# Patient Record
Sex: Female | Born: 1955
Health system: Southern US, Community
[De-identification: ages and names within clinical notes are randomized; demographics above are authoritative.]

## PROBLEM LIST (undated history)

## (undated) DIAGNOSIS — G43909 Migraine, unspecified, not intractable, without status migrainosus: Secondary | ICD-10-CM

## (undated) DIAGNOSIS — E785 Hyperlipidemia, unspecified: Secondary | ICD-10-CM

## (undated) DIAGNOSIS — I82401 Acute embolism and thrombosis of unspecified deep veins of right lower extremity: Secondary | ICD-10-CM

## (undated) DIAGNOSIS — R0602 Shortness of breath: Secondary | ICD-10-CM

## (undated) DIAGNOSIS — B029 Zoster without complications: Secondary | ICD-10-CM

## (undated) DIAGNOSIS — K219 Gastro-esophageal reflux disease without esophagitis: Secondary | ICD-10-CM

## (undated) DIAGNOSIS — T7840XA Allergy, unspecified, initial encounter: Secondary | ICD-10-CM

## (undated) DIAGNOSIS — L910 Hypertrophic scar: Secondary | ICD-10-CM

## (undated) DIAGNOSIS — M1990 Unspecified osteoarthritis, unspecified site: Secondary | ICD-10-CM

## (undated) DIAGNOSIS — I639 Cerebral infarction, unspecified: Secondary | ICD-10-CM

## (undated) DIAGNOSIS — Z780 Asymptomatic menopausal state: Secondary | ICD-10-CM

## (undated) DIAGNOSIS — L299 Pruritus, unspecified: Secondary | ICD-10-CM

## (undated) HISTORY — PX: OTHER SURGICAL HISTORY: SHX169

## (undated) HISTORY — PX: VARICOSE VEIN SURGERY: SHX832

## (undated) HISTORY — DX: Pruritus, unspecified: L29.9

## (undated) HISTORY — DX: Hyperlipidemia, unspecified: E78.5

## (undated) HISTORY — PX: ABDOMINAL HYSTERECTOMY: SHX81

## (undated) HISTORY — DX: Allergy, unspecified, initial encounter: T78.40XA

## (undated) HISTORY — DX: Hypertrophic scar: L91.0

## (undated) HISTORY — DX: Asymptomatic menopausal state: Z78.0

---

## 1979-04-01 DIAGNOSIS — I82401 Acute embolism and thrombosis of unspecified deep veins of right lower extremity: Secondary | ICD-10-CM

## 1979-04-01 HISTORY — DX: Acute embolism and thrombosis of unspecified deep veins of right lower extremity: I82.401

## 2011-04-30 ENCOUNTER — Emergency Department (HOSPITAL_BASED_OUTPATIENT_CLINIC_OR_DEPARTMENT_OTHER)
Admission: EM | Admit: 2011-04-30 | Discharge: 2011-04-30 | Disposition: A | Discharge status: Home / Self Care | Payer: Worker's Compensation | Attending: Emergency Medicine | Admitting: Emergency Medicine

## 2011-04-30 ENCOUNTER — Encounter: Payer: Self-pay | Admitting: *Deleted

## 2011-04-30 DIAGNOSIS — Z8739 Personal history of other diseases of the musculoskeletal system and connective tissue: Secondary | ICD-10-CM | POA: Insufficient documentation

## 2011-04-30 DIAGNOSIS — L089 Local infection of the skin and subcutaneous tissue, unspecified: Secondary | ICD-10-CM

## 2011-04-30 DIAGNOSIS — J45909 Unspecified asthma, uncomplicated: Secondary | ICD-10-CM | POA: Insufficient documentation

## 2011-04-30 HISTORY — DX: Unspecified osteoarthritis, unspecified site: M19.90

## 2011-04-30 MED ORDER — DOXYCYCLINE HYCLATE 100 MG PO CAPS: 100.0000 mg | ORAL_CAPSULE | Freq: Two times a day (BID) | ORAL | Status: AC

## 2011-04-30 MED ORDER — TETANUS-DIPHTH-ACELL PERTUSSIS 5-2.5-18.5 LF-MCG/0.5 IM SUSP
0.5000 mL | Freq: Once | INTRAMUSCULAR | Status: AC
  Administered 2011-04-30: 0.5 mL via INTRAMUSCULAR
  Filled 2011-04-30: qty 0.5

## 2011-04-30 MED ORDER — HYDROCODONE-ACETAMINOPHEN 5-325 MG PO TABS: 2.0000 | ORAL_TABLET | ORAL | Status: AC | PRN

## 2011-04-30 NOTE — ED Provider Notes (Signed)
Medical screening examination/treatment/procedure(s) were performed by non-physician practitioner and as supervising physician I was immediately available for consultation/collaboration.   Joya Gaskins, MD 04/30/11 [phone]

## 2011-04-30 NOTE — ED Provider Notes (Signed)
History     CSN: [id number] Arrival date & time: 04/30/2011  1:49 PM  Chief Complaint  Patient presents with  . Finger Injury    (Consider location/radiation/quality/duration/timing/severity/associated sxs/prior treatment) Patient is a 55 y.o. female presenting with hand pain. The history is provided by the patient. No language interpreter was used.  Hand Pain This is a new problem. The current episode started yesterday. The problem occurs constantly. The problem has been gradually worsening. The symptoms are aggravated by nothing. She has tried nothing for the symptoms. The treatment provided moderate relief.  Pt complains of swelling to her right middle finger.  Pt reports she stuck finger with a metal bread wrapper.  Pt complains of swelling and redness.  Past Medical History  Diagnosis Date  . Asthma   . Arthritis     Past Surgical History  Procedure Date  . Vein surgery   . Abdominal hysterectomy     No family history on file.  History  Substance Use Topics  . Smoking status: Never Smoker   . Smokeless tobacco: Not on file  . Alcohol Use: No    OB History    Grav Para Term Preterm Abortions TAB SAB Ect Mult Living                  Review of Systems  All other systems reviewed and are negative.    Allergies  Codeine  Home Medications   Current Outpatient Rx  Name Route Sig Dispense Refill  . ALBUTEROL IN Inhalation Inhale into the lungs.      . SYMBICORT IN Inhalation Inhale 1 puff into the lungs 2 (two) times daily.      Marland Kitchen HYDROXYZINE HCL PO Oral Take by mouth.      Marland Kitchen MONTELUKAST SODIUM 10 MG PO TABS Oral Take 10 mg by mouth at bedtime.      Marland Kitchen ROSUVASTATIN CALCIUM 10 MG PO TABS Oral Take 10 mg by mouth daily.        BP 133/73  Pulse 78  Temp(Src) 98 F (36.7 C) (Oral)  Resp 18  Ht 5\' 4"  (1.626 m)  Wt 190 lb (86.183 kg)  BMI 32.61 kg/m2  SpO2 99%  Physical Exam  Nursing note and vitals reviewed. Constitutional: She appears well-developed  and well-nourished.  HENT:  Head: Normocephalic.  Eyes: Pupils are equal, round, and reactive to light.  Musculoskeletal: She exhibits edema and tenderness.       Swollen distal finger right 3rd,  Warm to touch  Neurological: She is alert.  Skin: There is erythema.  Psychiatric: She has a normal mood and affect.    ED Course  Procedures (including critical care time)  Labs Reviewed - No data to display No results found.   No diagnosis found.    MDM  Pt counseled on soaking, antibiotics,  Pt advised to recheck here tomorrow.        Langston Masker, [place] 04/30/11 [phone]

## 2011-04-30 NOTE — ED Notes (Signed)
Pt reports she stuck her middle finger with twist tie at work on Friday- finger red and swollen- states this is worker's comp and she does need UDS

## 2011-05-01 ENCOUNTER — Emergency Department (INDEPENDENT_AMBULATORY_CARE_PROVIDER_SITE_OTHER): Payer: Worker's Compensation

## 2011-05-01 ENCOUNTER — Encounter (HOSPITAL_BASED_OUTPATIENT_CLINIC_OR_DEPARTMENT_OTHER): Payer: Self-pay | Admitting: *Deleted

## 2011-05-01 ENCOUNTER — Emergency Department (HOSPITAL_BASED_OUTPATIENT_CLINIC_OR_DEPARTMENT_OTHER)
Admission: EM | Admit: 2011-05-01 | Discharge: 2011-05-01 | Disposition: A | Discharge status: Home / Self Care | Payer: Worker's Compensation | Attending: Emergency Medicine | Admitting: Emergency Medicine

## 2011-05-01 DIAGNOSIS — W2209XA Striking against other stationary object, initial encounter: Secondary | ICD-10-CM | POA: Insufficient documentation

## 2011-05-01 DIAGNOSIS — X58XXXA Exposure to other specified factors, initial encounter: Secondary | ICD-10-CM

## 2011-05-01 DIAGNOSIS — Z79899 Other long term (current) drug therapy: Secondary | ICD-10-CM | POA: Insufficient documentation

## 2011-05-01 DIAGNOSIS — L089 Local infection of the skin and subcutaneous tissue, unspecified: Secondary | ICD-10-CM | POA: Insufficient documentation

## 2011-05-01 DIAGNOSIS — Y9269 Other specified industrial and construction area as the place of occurrence of the external cause: Secondary | ICD-10-CM | POA: Insufficient documentation

## 2011-05-01 DIAGNOSIS — S61209A Unspecified open wound of unspecified finger without damage to nail, initial encounter: Secondary | ICD-10-CM

## 2011-05-01 DIAGNOSIS — M79609 Pain in unspecified limb: Secondary | ICD-10-CM

## 2011-05-01 DIAGNOSIS — R609 Edema, unspecified: Secondary | ICD-10-CM

## 2011-05-01 DIAGNOSIS — J45909 Unspecified asthma, uncomplicated: Secondary | ICD-10-CM | POA: Insufficient documentation

## 2011-05-01 NOTE — ED Provider Notes (Signed)
History     CSN: [id number] Arrival date & time: 05/01/2011  1:08 PM  Chief Complaint  Patient presents with  . Finger Injury    (Consider location/radiation/quality/duration/timing/severity/associated sxs/prior treatment) HPI Patient injured her right middle finger yesterday while at work she punctured the distal tip with a metal twist tie used to close plastic bags of bread. Seen here yesterday prescribed hydrocodone and doxycycline presents today his pain is worse. She is uncertain if there could be a foreign body in the wound .no new injury pain is nonradiating located at distal phalanx of middle finger, right hand severity is moderate pain is worse with palpation somewhat improved with Norco prescribed Past Medical History  Diagnosis Date  . Asthma   . Arthritis     Past Surgical History  Procedure Date  . Vein surgery   . Abdominal hysterectomy     History reviewed. No pertinent family history.  History  Substance Use Topics  . Smoking status: Never Smoker   . Smokeless tobacco: Not on file  . Alcohol Use: No    OB History    Grav Para Term Preterm Abortions TAB SAB Ect Mult Living                  Review of Systems  Constitutional: Negative.   Musculoskeletal:       Trauma right middle finger  Skin: Negative.   Neurological: Negative.   Hematological: Negative.   Psychiatric/Behavioral: Negative.     Allergies  Codeine  Home Medications   Current Outpatient Rx  Name Route Sig Dispense Refill  . ALBUTEROL IN Inhalation Inhale into the lungs.      . SYMBICORT IN Inhalation Inhale 1 puff into the lungs 2 (two) times daily.      Marland Kitchen DOXYCYCLINE HYCLATE 100 MG PO CAPS Oral Take 1 capsule (100 mg total) by mouth 2 (two) times daily. 20 capsule 0  . HYDROCODONE-ACETAMINOPHEN 5-325 MG PO TABS Oral Take 2 tablets by mouth every 4 (four) hours as needed for pain. 10 tablet 0  . HYDROXYZINE HCL PO Oral Take by mouth.      Marland Kitchen MONTELUKAST SODIUM 10 MG PO TABS Oral  Take 10 mg by mouth at bedtime.      Marland Kitchen ROSUVASTATIN CALCIUM 10 MG PO TABS Oral Take 10 mg by mouth daily.        BP 129/72  Pulse 69  Temp(Src) 97.7 F (36.5 C) (Oral)  Resp 18  Ht 5\' 4"  (1.626 m)  Wt 190 lb (86.183 kg)  BMI 32.61 kg/m2  SpO2 100%  Physical Exam  Nursing note and vitals reviewed. Constitutional: She appears well-developed and well-nourished.  HENT:  Head: Normocephalic and atraumatic.  Eyes: Conjunctivae are normal. Pupils are equal, round, and reactive to light.  Neck: Neck supple. No tracheal deviation present. No thyromegaly present.  Cardiovascular: Normal rate and regular rhythm.   No murmur heard. Pulmonary/Chest: Effort normal and breath sounds normal.  Abdominal: Soft. Bowel sounds are normal. She exhibits no distension. There is no tenderness.  Musculoskeletal: Normal range of motion. She exhibits no edema and no tenderness.       Right middle finger tender distal phalanx with minimal redness the distal phalanx there is a 2 mm raised area at ulnar aspect of distal phalanx, which is minimally tender, good capillary refill  Neurological: She is alert. Coordination normal.  Skin: Skin is warm and dry. No rash noted.  Psychiatric: She has a normal mood and affect.  ED Course  Procedures (including critical care time)  Labs Reviewed - No data to display No results found.   No diagnosis found.    MDM  X-ray interpreted by radiologist normal, reviewed by me Spoke with Dr.Weingold. I suspect soft tissue infection. Plan soak finger in warm soapy water 3 times daily 20 minutes at a time. Continue present medications patient is to call Dr.Weingold's office to arrange to be seen tomorrow as outpatient Diagnosis soft tissue injury right middle finger        Doug Sou, MD 05/01/11 1405

## 2011-06-04 ENCOUNTER — Emergency Department (HOSPITAL_BASED_OUTPATIENT_CLINIC_OR_DEPARTMENT_OTHER)
Admission: EM | Admit: 2011-06-04 | Discharge: 2011-06-04 | Disposition: A | Discharge status: Home / Self Care | Payer: BC Managed Care – PPO | Attending: Emergency Medicine | Admitting: Emergency Medicine

## 2011-06-04 ENCOUNTER — Encounter (HOSPITAL_BASED_OUTPATIENT_CLINIC_OR_DEPARTMENT_OTHER): Payer: Self-pay | Admitting: Emergency Medicine

## 2011-06-04 DIAGNOSIS — B029 Zoster without complications: Secondary | ICD-10-CM | POA: Insufficient documentation

## 2011-06-04 DIAGNOSIS — J45909 Unspecified asthma, uncomplicated: Secondary | ICD-10-CM | POA: Insufficient documentation

## 2011-06-04 DIAGNOSIS — Z79899 Other long term (current) drug therapy: Secondary | ICD-10-CM | POA: Insufficient documentation

## 2011-06-04 DIAGNOSIS — Z8739 Personal history of other diseases of the musculoskeletal system and connective tissue: Secondary | ICD-10-CM | POA: Insufficient documentation

## 2011-06-04 HISTORY — DX: Zoster without complications: B02.9

## 2011-06-04 MED ORDER — VALACYCLOVIR HCL 1 G PO TABS: 1000.0000 mg | ORAL_TABLET | Freq: Three times a day (TID) | ORAL | Status: DC

## 2011-06-04 NOTE — ED Notes (Signed)
Triage acuity upgraded d/t pt c/o increased pain 7/10

## 2011-06-04 NOTE — ED Notes (Signed)
Pt has herpetic lesion on forehead.  Lesion is closed, no drainage.  Pt wants to treat it early.

## 2011-06-04 NOTE — ED Provider Notes (Signed)
History  Scribed for Carleene Cooper III, MD, the patient was seen in [facility]. The chart was scribed by Gilman Schmidt. The patients care was started at 5:26 PM.   CSN: [id number] Arrival date & time: 06/04/2011  3:26 PM   First MD Initiated Contact with Patient 06/04/11 1717      Chief Complaint  Patient presents with  . Herpes Zoster     HPI Anita Benton is a 55 y.o. female who presents to the Emergency Department complaining of herpes zoster. Pt has herpetic lesion on forehead. Pt has had lesions for ~20 years.  Lesion is closed, no drainage. Pt wants to treat it early. States the lesion itches and burns and "feels like skin is stretching". There are no other associated symptoms and no other alleviating or aggravating factors.     Past Medical History  Diagnosis Date  . Asthma   . Arthritis   . Shingles     Past Surgical History  Procedure Date  . Vein surgery   . Abdominal hysterectomy     History reviewed. No pertinent family history.  History  Substance Use Topics  . Smoking status: Never Smoker   . Smokeless tobacco: Not on file  . Alcohol Use: No    OB History    Grav Para Term Preterm Abortions TAB SAB Ect Mult Living                  Review of Systems  Skin:       Lesion   All other systems reviewed and are negative.    Allergies  Rice and Codeine  Home Medications   Current Outpatient Rx  Name Route Sig Dispense Refill  . SYMBICORT IN Inhalation Inhale 1 puff into the lungs 2 (two) times daily.      Marland Kitchen HYDROXYZINE HCL PO Oral Take 1 tablet by mouth once as needed. For itching    . MONTELUKAST SODIUM 10 MG PO TABS Oral Take 10 mg by mouth at bedtime.      Marland Kitchen ROSUVASTATIN CALCIUM 10 MG PO TABS Oral Take 10 mg by mouth daily.      . ALBUTEROL SULFATE HFA 108 (90 BASE) MCG/ACT IN AERS Inhalation Inhale 2 puffs into the lungs every 6 (six) hours as needed. For shortness of breath or wheezing     . VALACYCLOVIR HCL 1 G PO TABS Oral Take 1 tablet  (1,000 mg total) by mouth 3 (three) times daily. 21 tablet 0    BP 124/92  Pulse 95  Temp(Src) 98.3 F (36.8 C) (Oral)  Resp 22  Ht 5\' 4"  (1.626 m)  Wt 190 lb (86.183 kg)  BMI 32.61 kg/m2  SpO2 100%  Physical Exam  Constitutional: She is oriented to person, place, and time. She appears well-developed and well-nourished.  Non-toxic appearance. She does not have a sickly appearance.  HENT:  Head: Normocephalic and atraumatic.  Eyes: Conjunctivae, EOM and lids are normal. Pupils are equal, round, and reactive to light. No scleral icterus.  Neck: Trachea normal and normal range of motion.  Cardiovascular: Regular rhythm and normal heart sounds.   Pulmonary/Chest: Effort normal and breath sounds normal.  Abdominal: Soft. Normal appearance. There is no tenderness. There is no rebound, no guarding and no CVA tenderness.  Musculoskeletal: Normal range of motion.  Neurological: She is alert and oriented to person, place, and time. She has normal strength.  Skin: Skin is warm, dry and intact. Rash (versicular rash right side of forehead) noted.  ED Course  Procedures  DIAGNOSTIC STUDIES: Oxygen Saturation is 100% on room air, normal by my interpretation.    COORDINATION OF CARE: 5:26PM:  - Patient evaluated by ED physician,   Rx for herpes zoster with valcyclovir.  \I personally performed the services described in this documentation, which was scribed in my presence. The recorded information has been reviewed and considered.  Osvaldo Human, M.D.    Carleene Cooper III, MD 06/04/11 [phone]

## 2011-06-13 ENCOUNTER — Other Ambulatory Visit: Payer: Self-pay | Admitting: Internal Medicine

## 2011-06-13 ENCOUNTER — Ambulatory Visit (INDEPENDENT_AMBULATORY_CARE_PROVIDER_SITE_OTHER): Payer: BC Managed Care – PPO | Admitting: Internal Medicine

## 2011-06-13 ENCOUNTER — Encounter: Payer: Self-pay | Admitting: Internal Medicine

## 2011-06-13 DIAGNOSIS — Z1231 Encounter for screening mammogram for malignant neoplasm of breast: Secondary | ICD-10-CM

## 2011-06-13 DIAGNOSIS — Z23 Encounter for immunization: Secondary | ICD-10-CM

## 2011-06-13 DIAGNOSIS — J45909 Unspecified asthma, uncomplicated: Secondary | ICD-10-CM | POA: Insufficient documentation

## 2011-06-13 DIAGNOSIS — B029 Zoster without complications: Secondary | ICD-10-CM | POA: Insufficient documentation

## 2011-06-13 DIAGNOSIS — J309 Allergic rhinitis, unspecified: Secondary | ICD-10-CM

## 2011-06-13 DIAGNOSIS — I82409 Acute embolism and thrombosis of unspecified deep veins of unspecified lower extremity: Secondary | ICD-10-CM | POA: Insufficient documentation

## 2011-06-13 DIAGNOSIS — L910 Hypertrophic scar: Secondary | ICD-10-CM | POA: Insufficient documentation

## 2011-06-13 DIAGNOSIS — N951 Menopausal and female climacteric states: Secondary | ICD-10-CM

## 2011-06-13 DIAGNOSIS — Z780 Asymptomatic menopausal state: Secondary | ICD-10-CM | POA: Insufficient documentation

## 2011-06-13 DIAGNOSIS — T7840XA Allergy, unspecified, initial encounter: Secondary | ICD-10-CM | POA: Insufficient documentation

## 2011-06-13 DIAGNOSIS — Z139 Encounter for screening, unspecified: Secondary | ICD-10-CM

## 2011-06-13 DIAGNOSIS — E785 Hyperlipidemia, unspecified: Secondary | ICD-10-CM | POA: Insufficient documentation

## 2011-06-13 DIAGNOSIS — M1990 Unspecified osteoarthritis, unspecified site: Secondary | ICD-10-CM | POA: Insufficient documentation

## 2011-06-13 DIAGNOSIS — E559 Vitamin D deficiency, unspecified: Secondary | ICD-10-CM | POA: Insufficient documentation

## 2011-06-13 MED ORDER — BUDESONIDE-FORMOTEROL FUMARATE 160-4.5 MCG/ACT IN AERO: 2.0000 | INHALATION_SPRAY | Freq: Two times a day (BID) | RESPIRATORY_TRACT | Status: DC

## 2011-06-13 MED ORDER — ALBUTEROL SULFATE HFA 108 (90 BASE) MCG/ACT IN AERS: 2.0000 | INHALATION_SPRAY | Freq: Four times a day (QID) | RESPIRATORY_TRACT | Status: DC | PRN

## 2011-06-13 MED ORDER — ATORVASTATIN CALCIUM 10 MG PO TABS: 10.0000 mg | ORAL_TABLET | Freq: Every day | ORAL | Status: DC

## 2011-06-13 MED ORDER — ROSUVASTATIN CALCIUM 10 MG PO TABS: 10.0000 mg | ORAL_TABLET | Freq: Every day | ORAL | Status: DC

## 2011-06-13 MED ORDER — MONTELUKAST SODIUM 10 MG PO TABS: 10.0000 mg | ORAL_TABLET | Freq: Every day | ORAL | Status: DC

## 2011-06-13 MED ORDER — BUDESONIDE 32 MCG/ACT NA SUSP: 1.0000 | Freq: Every day | NASAL | Status: DC

## 2011-06-13 MED ORDER — ESCITALOPRAM OXALATE 10 MG PO TABS: 10.0000 mg | ORAL_TABLET | Freq: Every day | ORAL | Status: DC

## 2011-06-13 NOTE — Progress Notes (Signed)
Subjective:    Patient ID: Anita Benton, female    DOB: Jan 12, 1956, 55 y.o.   MRN: [id number]  HPI New pt here for first visit.  [facility] of [facility].  PMH of DJD hands and R hip, Vitamin D deficiency, hyperlipidemia, venous varicosities S/P vein stripping, asthma x 20 years, allergic rhinitis, chronic itching treated with Hydroxyzine, Herpes Zoster facial, and keloid scarring.  She recently has a new job within last year and now has Program[employer], applications.    Reports she had recent bout with facial shingles involving forehead and it has healed now.  It was treated with Valtrex.  She reports no visual or hearing problems.  She has had multiple episodes in the past.  She reports her insurance does not cover the vaccine until age 11.    Also has significant menopausal hot flushes.  Day and night sweating.  Started 3 years ago but worse now.  No vaginal dryness.  No personal of FH of MI or CVA.  Mother has HTN, no Personal of FH of ovarian or uterine CA.  Mggm and paternal cousin, and maternal niece has breast cancer.  Pt had post surgical DVT R leg  Allergies  Allergen Reactions  . Rice Shortness Of Breath and Rash  . Codeine Rash   Past Medical History  Diagnosis Date  . Asthma   . Arthritis   . Shingles   . Hyperlipidemia    Past Surgical History  Procedure Date  . Vein surgery   . Abdominal hysterectomy   . Cesarean section 1974, 1977, 1979   History   Social History  . Marital Status: Legally Separated    Spouse Name: N/A    Number of Children: N/A  . Years of Education: N/A   Occupational History  . Not on file.   Social History Main Topics  . Smoking status: Never Smoker   . Smokeless tobacco: Never Used  . Alcohol Use: No  . Drug Use: No  . Sexually Active: Yes    Birth Control/ Protection: Surgical   Other Topics Concern  . Not on file   Social History Narrative  . No narrative on file   Family History  Problem Relation Age of Onset  . Cancer  Mother     lung   There is no problem list on file for this patient.  Current Outpatient Prescriptions on File Prior to Visit  Medication Sig Dispense Refill  . albuterol (PROVENTIL HFA;VENTOLIN HFA) 108 (90 BASE) MCG/ACT inhaler Inhale 2 puffs into the lungs every 6 (six) hours as needed. For shortness of breath or wheezing       . Budesonide-Formoterol Fumarate (SYMBICORT IN) Inhale 2 puffs into the lungs daily.       . montelukast (SINGULAIR) 10 MG tablet Take 10 mg by mouth at bedtime.        . rosuvastatin (CRESTOR) 10 MG tablet Take 10 mg by mouth daily.             Review of Systems See HPI    Objective:   Physical Exam  Physical Exam  Nursing note and vitals reviewed.  Constitutional: She is oriented to person, place, and time. She appears well-developed and well-nourished.  HENT:  Head: Normocephalic and atraumatic.  Cardiovascular: Normal rate and regular rhythm. Exam reveals no gallop and no friction rub.  No murmur heard.  Pulmonary/Chest: Breath sounds normal. She has no wheezes. She has no rales.  Neurological: She is alert and oriented  to person, place, and time.  Skin: Skin is warm and dry.  Psychiatric: She has a normal mood and affect. Her behavior is normal.  LE:  Minimal edema       Assessment & Plan:  1)  Asthma  Flu vaccine today.  Reorder Symbicort, Proventil, Singulair 2) Menopausal hot flushes:  With h/o of DVT I counseled risks outweigh benefits.  Will try Lexapro 10 mg daily.  Pt aware it is off label use.  Pt received and I reviewed risk/benefit sheet with her. 3)  Allergic rhinitis:  Refill Rhinocort 4)  H/O DVT rR leg 5)  Hyperlipidemia  Check baseline labs, lipids 6)  Vitmain D deficiency  Check today 7)  Arthritis  Schedule CPE

## 2011-06-13 NOTE — Progress Notes (Signed)
Addended by: Nelly Laurence H on: 06/13/2011 12:05 PM   Modules accepted: Orders

## 2011-06-13 NOTE — Patient Instructions (Signed)
Labs willl be mailed to you  Take Lexapro once daily  Schedule CPE with me    Flu shot given today

## 2011-06-14 ENCOUNTER — Ambulatory Visit (HOSPITAL_BASED_OUTPATIENT_CLINIC_OR_DEPARTMENT_OTHER)
Admission: RE | Admit: 2011-06-14 | Discharge: 2011-06-14 | Disposition: A | Discharge status: Home / Self Care | Payer: BC Managed Care – PPO | Source: Ambulatory Visit | Attending: Internal Medicine | Admitting: Internal Medicine

## 2011-06-14 ENCOUNTER — Encounter: Payer: Self-pay | Admitting: Emergency Medicine

## 2011-06-14 DIAGNOSIS — Z1231 Encounter for screening mammogram for malignant neoplasm of breast: Secondary | ICD-10-CM | POA: Insufficient documentation

## 2011-06-14 LAB — CBC WITH DIFFERENTIAL/PLATELET
Basophils Absolute: 0 10*3/uL (ref 0.0–0.1)
Eosinophils Relative: 1 % (ref 0–5)
HCT: 41.2 % (ref 36.0–46.0)
Lymphocytes Relative: 23 % (ref 12–46)
MCHC: 33.7 g/dL (ref 30.0–36.0)
MCV: 89.8 fL (ref 78.0–100.0)
Monocytes Absolute: 0.5 10*3/uL (ref 0.1–1.0)
RDW: 14.2 % (ref 11.5–15.5)
WBC: 9.4 10*3/uL (ref 4.0–10.5)

## 2011-06-14 LAB — LIPID PANEL
HDL: 62 mg/dL (ref >39)
LDL Cholesterol: 134 mg/dL — ABNORMAL HIGH (ref 0–99)
Total CHOL/HDL Ratio: 3.5 Ratio

## 2011-06-14 LAB — COMPREHENSIVE METABOLIC PANEL
ALT: 9 U/L (ref 0–35)
Albumin: 3.9 g/dL (ref 3.5–5.2)
Alkaline Phosphatase: 76 U/L (ref 39–117)
BUN: 14 mg/dL (ref 6–23)
Chloride: 104 mEq/L (ref 96–112)
Creat: 0.7 mg/dL (ref 0.50–1.10)
Total Bilirubin: 0.5 mg/dL (ref 0.3–1.2)
Total Protein: 7.2 g/dL (ref 6.0–8.3)

## 2011-06-27 ENCOUNTER — Ambulatory Visit (INDEPENDENT_AMBULATORY_CARE_PROVIDER_SITE_OTHER): Payer: BC Managed Care – PPO | Admitting: Internal Medicine

## 2011-06-27 ENCOUNTER — Ambulatory Visit (HOSPITAL_BASED_OUTPATIENT_CLINIC_OR_DEPARTMENT_OTHER)
Admission: RE | Admit: 2011-06-27 | Discharge: 2011-06-27 | Disposition: A | Discharge status: Home / Self Care | Payer: BC Managed Care – PPO | Source: Ambulatory Visit | Attending: Internal Medicine | Admitting: Internal Medicine

## 2011-06-27 ENCOUNTER — Encounter: Payer: Self-pay | Admitting: Internal Medicine

## 2011-06-27 DIAGNOSIS — D229 Melanocytic nevi, unspecified: Secondary | ICD-10-CM | POA: Insufficient documentation

## 2011-06-27 DIAGNOSIS — D239 Other benign neoplasm of skin, unspecified: Secondary | ICD-10-CM

## 2011-06-27 DIAGNOSIS — B029 Zoster without complications: Secondary | ICD-10-CM

## 2011-06-27 DIAGNOSIS — R319 Hematuria, unspecified: Secondary | ICD-10-CM

## 2011-06-27 DIAGNOSIS — M169 Osteoarthritis of hip, unspecified: Secondary | ICD-10-CM | POA: Insufficient documentation

## 2011-06-27 DIAGNOSIS — M1610 Unilateral primary osteoarthritis, unspecified hip: Secondary | ICD-10-CM | POA: Insufficient documentation

## 2011-06-27 DIAGNOSIS — M25551 Pain in right hip: Secondary | ICD-10-CM

## 2011-06-27 DIAGNOSIS — Z0000 Encounter for general adult medical examination without abnormal findings: Secondary | ICD-10-CM

## 2011-06-27 DIAGNOSIS — Z139 Encounter for screening, unspecified: Secondary | ICD-10-CM

## 2011-06-27 DIAGNOSIS — M25559 Pain in unspecified hip: Secondary | ICD-10-CM

## 2011-06-27 DIAGNOSIS — I8390 Asymptomatic varicose veins of unspecified lower extremity: Secondary | ICD-10-CM

## 2011-06-27 DIAGNOSIS — Z23 Encounter for immunization: Secondary | ICD-10-CM

## 2011-06-27 DIAGNOSIS — N390 Urinary tract infection, site not specified: Secondary | ICD-10-CM | POA: Insufficient documentation

## 2011-06-27 LAB — POCT URINALYSIS DIPSTICK
Nitrite, UA: NEGATIVE
Urobilinogen, UA: NEGATIVE
pH, UA: 6.5

## 2011-06-27 MED ORDER — NITROFURANTOIN MACROCRYSTAL 100 MG PO CAPS: 100.0000 mg | ORAL_CAPSULE | Freq: Four times a day (QID) | ORAL | Status: AC

## 2011-06-27 NOTE — Progress Notes (Signed)
Addended by: Chip Boer on: 06/27/2011 10:49 AM   Modules accepted: Orders

## 2011-06-27 NOTE — Progress Notes (Signed)
Subjective:    Patient ID: Anita Benton, female    DOB: 01-18-1956, 55 y.o.   MRN: [id number]  HPI Lena is here for comprhensive evaluation.  She reports great relief from her menopausal flushes on Lexapro.   She is tolerating Lexapro well.  See U/A  She reports some urgency but no dysuria or flank pain.  She does report intermittant R hip pain that she attributes to arthriits.  Strong GH of arthritis.  No painnow.  Usually relieved with Alleve  Darkened nevus R hand has been present for years per her report.  No FH of skin ca She has never had a screening colonoscopy.   She reports infrequent blood in stool that she attributes to hemorrhoids.  No FH of GI malignancy  She had episode of herpes zoster in November of this year per her report  Allergies  Allergen Reactions  . Rice Shortness Of Breath and Rash  . Levaquin Itching and Swelling    Lip swelling  . Codeine Rash   Past Medical History  Diagnosis Date  . Shingles   . Arthritis   . Asthma   . Hyperlipidemia   . Vitamin D deficiency   . Allergy   . Itching   . Keloid   . Menopause    Past Surgical History  Procedure Date  . Vein surgery   . Abdominal hysterectomy   . Cesarean section 1974, 1977, 1979   History   Social History  . Marital Status: Divorced    Spouse Name: N/A    Number of Children: N/A  . Years of Education: N/A   Occupational History  . Not on file.   Social History Main Topics  . Smoking status: Never Smoker   . Smokeless tobacco: Never Used  . Alcohol Use: No  . Drug Use: No  . Sexually Active: Yes    Birth Control/ Protection: Surgical   Other Topics Concern  . Not on file   Social History Narrative  . No narrative on file   Family History  Problem Relation Age of Onset  . Cancer Mother     lung   Patient Active Problem List  Diagnoses  . Arthritis  . Asthma  . Hyperlipidemia  . Shingles  . Vitamin D deficiency  . Allergy  . Keloid  . Menopause  . DVT,  lower extremity  . Varicose veins   Current Outpatient Prescriptions on File Prior to Visit  Medication Sig Dispense Refill  . albuterol (PROVENTIL HFA;VENTOLIN HFA) 108 (90 BASE) MCG/ACT inhaler Inhale 2 puffs into the lungs every 6 (six) hours as needed. For shortness of breath or wheezing  1 Inhaler  1  . atorvastatin (LIPITOR) 10 MG tablet Take 1 tablet (10 mg total) by mouth at bedtime.  30 tablet  1  . budesonide (RHINOCORT AQUA) 32 MCG/ACT nasal spray Place 1 spray into the nose daily.  1 Bottle  1  . budesonide-formoterol (SYMBICORT) 160-4.5 MCG/ACT inhaler Inhale 2 puffs into the lungs 2 (two) times daily.  1 Inhaler  1  . escitalopram (LEXAPRO) 10 MG tablet Take 1 tablet (10 mg total) by mouth daily.  30 tablet  2  . hydrOXYzine (ATARAX/VISTARIL) 25 MG tablet Take 25 mg by mouth 2 (two) times daily as needed.        . montelukast (SINGULAIR) 10 MG tablet Take 1 tablet (10 mg total) by mouth at bedtime.  30 tablet  1       Review of Systems  See HPI  No chest pain no SOb, no edema NO cough No LE Edema No change in color of stool , dyspepsia,     Objective:   Physical Exam  Physical Exam  Nursing note and vitals reviewed.  Constitutional: She is oriented to person, place, and time. She appears well-developed and well-nourished.  HENT:  Head: Normocephalic and atraumatic.  Right Ear: Tympanic membrane and ear canal normal. No drainage. Tympanic membrane is not injected and not erythematous.  Left Ear: Tympanic membrane and ear canal normal. No drainage. Tympanic membrane is not injected and not erythematous.  Nose: Nose normal. Right sinus exhibits no maxillary sinus tenderness and no frontal sinus tenderness. Left sinus exhibits no maxillary sinus tenderness and no frontal sinus tenderness.  Mouth/Throat: Oropharynx is clear and moist. No oral lesions. No oropharyngeal exudate.  Eyes: Conjunctivae and EOM are normal. Pupils are equal, round, and reactive to light.  Neck:  Normal range of motion. Neck supple. No JVD present. Carotid bruit is not present. No mass and no thyromegaly present.  Cardiovascular: Normal rate, regular rhythm, S1 normal, S2 normal and intact distal pulses. Exam reveals no gallop and no friction rub.  No murmur heard.  Pulses:  Carotid pulses are 2+ on the right side, and 2+ on the left side.  Dorsalis pedis pulses are 2+ on the right side, and 2+ on the left side.  No carotid bruit. No LE edema  Pulmonary/Chest: Breath sounds normal. She has no wheezes. She has no rales. She exhibits no tenderness.  Abdominal: Soft. Bowel sounds are normal. She exhibits no distension and no mass. There is no hepatosplenomegaly. There is no tenderness. There is no CVA tenderness.  Musculoskeletal: Normal range of motion.  No active synovitis to joints.  L hip no pain with ROM.  R hip no pain with ROM  Lymphadenopathy:  She has no cervical adenopathy.  She has no axillary adenopathy.  Right: No inguinal and no supraclavicular adenopathy present.  Left: No inguinal and no supraclavicular adenopathy present.  Neurological: She is alert and oriented to person, place, and time. She has normal strength and normal reflexes. She displays no tremor. No cranial nerve deficit or sensory deficit. Coordination and gait normal.  Skin: Skin is warm and dry. No rash noted. No cyanosis. Nails show no clubbing.   R palm has  Darkened nevus Psychiatric: She has a normal mood and affect. Her speech is normal and behavior is normal. Cognition and memory are normal.         Assessment & Plan:  1)  Health Maintenance  See scanned sheet.  Pneumovax given today.  Will schedule Gi referral for screening colonsocopy.  UTD with mammogram  EKG  NSR no acute changes 2)  R hip pain X-ray today  OTC NSaid of choice for flares 3)  Darkened nevus R palm will refer to [employer] 4)  Hematuria Will treat for UTI  With Macrodantin for 1 week.  Return to see me in January for  repeat U/A 5)  ASthma  Well controlled no Rescue inhaler use. 6)  Hyperlipidemia on Lipitor 7)  Menopausal flushes  Well controlled on Lexapor 8)  H/O post surgical DVT 9)  REcent facial  Herpes zoster   Will wait 6-8 months  Info given regarding vaccine.  She is to call me in the spring to get vaccine order  See me in January

## 2011-06-27 NOTE — Patient Instructions (Addendum)
Take antibiotic as prescribed.    See me in January for repeat urine test  We will refer you to dermatologist and GI doctor for screening colonoscopy  To xray today

## 2011-06-28 ENCOUNTER — Encounter: Payer: Self-pay | Admitting: Internal Medicine

## 2011-06-29 LAB — CULTURE, URINE COMPREHENSIVE
Colony Count: NO GROWTH
Organism ID, Bacteria: NO GROWTH

## 2011-07-12 ENCOUNTER — Telehealth: Payer: Self-pay | Admitting: Emergency Medicine

## 2011-07-12 NOTE — Telephone Encounter (Signed)
Spoke with Anita Benton.  She states that she feels like her asthma is not well controlled today.  She has used her albuterol inhaler but still feels congested.  She is concerned about these symptoms.  She needs to come to the building to pick up scripts, wants to see DDS.  Per DDS, pt will need to go to urgent care or ED today.  LMOVM for [name] with same information- urgent care or ED today to have them evaluate asthma.  Advised her to call the office with any questions

## 2011-07-12 NOTE — Telephone Encounter (Signed)
Pt came into the office, I advised her to go to urgent care or ED for treatment, she is agreeable

## 2011-08-02 ENCOUNTER — Encounter: Payer: Self-pay | Admitting: Internal Medicine

## 2011-08-02 ENCOUNTER — Ambulatory Visit (HOSPITAL_BASED_OUTPATIENT_CLINIC_OR_DEPARTMENT_OTHER)
Admission: RE | Admit: 2011-08-02 | Discharge: 2011-08-02 | Disposition: A | Discharge status: Home / Self Care | Payer: BC Managed Care – PPO | Source: Ambulatory Visit | Attending: Internal Medicine | Admitting: Internal Medicine

## 2011-08-02 ENCOUNTER — Ambulatory Visit (INDEPENDENT_AMBULATORY_CARE_PROVIDER_SITE_OTHER): Payer: BC Managed Care – PPO | Admitting: Internal Medicine

## 2011-08-02 ENCOUNTER — Encounter: Payer: Self-pay | Admitting: Emergency Medicine

## 2011-08-02 DIAGNOSIS — J40 Bronchitis, not specified as acute or chronic: Secondary | ICD-10-CM

## 2011-08-02 DIAGNOSIS — R05 Cough: Secondary | ICD-10-CM

## 2011-08-02 DIAGNOSIS — R509 Fever, unspecified: Secondary | ICD-10-CM | POA: Insufficient documentation

## 2011-08-02 DIAGNOSIS — J45909 Unspecified asthma, uncomplicated: Secondary | ICD-10-CM | POA: Insufficient documentation

## 2011-08-02 DIAGNOSIS — R059 Cough, unspecified: Secondary | ICD-10-CM

## 2011-08-02 DIAGNOSIS — J111 Influenza due to unidentified influenza virus with other respiratory manifestations: Secondary | ICD-10-CM

## 2011-08-02 LAB — POCT INFLUENZA A/B: Influenza B, POC: POSITIVE

## 2011-08-02 MED ORDER — METHYLPREDNISOLONE ACETATE 80 MG/ML IJ SUSP
120.0000 mg | Freq: Once | INTRAMUSCULAR | Status: AC
  Administered 2011-08-02: 120 mg via INTRAMUSCULAR

## 2011-08-02 MED ORDER — ALBUTEROL SULFATE (2.5 MG/3ML) 0.083% IN NEBU
2.5000 mg | INHALATION_SOLUTION | Freq: Once | RESPIRATORY_TRACT | Status: AC
  Administered 2011-08-02: 2.5 mg via RESPIRATORY_TRACT

## 2011-08-02 MED ORDER — PREDNISONE 20 MG PO TABS: ORAL_TABLET | ORAL | Status: DC

## 2011-08-02 MED ORDER — OSELTAMIVIR PHOSPHATE 75 MG PO CAPS: 75.0000 mg | ORAL_CAPSULE | Freq: Two times a day (BID) | ORAL | Status: DC

## 2011-08-02 NOTE — Patient Instructions (Signed)
Use nebulizer 3 times a day -   Do not use both inhaler and nebulizer.  OK to use inhaler at night only  See me on Monday    Use Prednisone as directed  And Take Tamiflu

## 2011-08-02 NOTE — Progress Notes (Signed)
Subjective:    Patient ID: Anita Benton, female    DOB: 02/19/1956, 56 y.o.   MRN: [id number]  HPI  Anita Benton is here with acute visit.  She has been using Tussionex for cough but in the last 3 days began with fever, myalgias, cough and wheezing.  She has known history of asthma.  She reports she did have a flu vaccine.  No headache, or N/V  Has chest pain when choughing.  Using Nebulizer and albuterol both at home  Allergies  Allergen Reactions  . Rice Shortness Of Breath and Rash  . Levaquin Itching and Swelling    Lip swelling  . Codeine Rash   Past Medical History  Diagnosis Date  . Shingles   . Arthritis   . Asthma   . Hyperlipidemia   . Vitamin D deficiency   . Allergy   . Itching   . Keloid   . Menopause    Past Surgical History  Procedure Date  . Vein surgery   . Abdominal hysterectomy   . Cesarean section 1974, 1977, 1979   History   Social History  . Marital Status: Divorced    Spouse Name: N/A    Number of Children: N/A  . Years of Education: N/A   Occupational History  . Not on file.   Social History Main Topics  . Smoking status: Never Smoker   . Smokeless tobacco: Never Used  . Alcohol Use: No  . Drug Use: No  . Sexually Active: Yes    Birth Control/ Protection: Surgical   Other Topics Concern  . Not on file   Social History Narrative  . No narrative on file   Family History  Problem Relation Age of Onset  . Cancer Mother     lung   Patient Active Problem List  Diagnoses  . Arthritis  . Asthma  . Hyperlipidemia  . Shingles  . Vitamin D deficiency  . Allergy  . Keloid  . Menopause  . DVT, lower extremity  . Varicose veins  . Urinary tract infection, site not specified  . Nevus   Current Outpatient Prescriptions on File Prior to Visit  Medication Sig Dispense Refill  . albuterol (PROVENTIL HFA;VENTOLIN HFA) 108 (90 BASE) MCG/ACT inhaler Inhale 2 puffs into the lungs every 6 (six) hours as needed. For shortness of breath or  wheezing  1 Inhaler  1  . atorvastatin (LIPITOR) 10 MG tablet Take 1 tablet (10 mg total) by mouth at bedtime.  30 tablet  1  . budesonide (RHINOCORT AQUA) 32 MCG/ACT nasal spray Place 1 spray into the nose daily.  1 Bottle  1  . budesonide-formoterol (SYMBICORT) 160-4.5 MCG/ACT inhaler Inhale 2 puffs into the lungs 2 (two) times daily.  1 Inhaler  1  . escitalopram (LEXAPRO) 10 MG tablet Take 1 tablet (10 mg total) by mouth daily.  30 tablet  2  . hydrOXYzine (ATARAX/VISTARIL) 25 MG tablet Take 25 mg by mouth 2 (two) times daily as needed.        . montelukast (SINGULAIR) 10 MG tablet Take 1 tablet (10 mg total) by mouth at bedtime.  30 tablet  1   No current facility-administered medications on file prior to visit.        Review of Systems See HPI    Objective:   Physical Exam Physical Exam  Nursing note and vitals reviewed.   Peak flow 260.  Rapid influenza in office pos.  CXR no active disease Constitutional: She is oriented  to person, place, and time. She appears well-developed and well-nourished. She is cooperative.  HENT:  Head: Normocephalic and atraumatic.  Nose: Mucosal edema present.  Eyes: Conjunctivae and EOM are normal. Pupils are equal, round, and reactive to light.  Neck: Neck supple.  Cardiovascular: Regular rhythm, normal heart sounds, intact distal pulses and normal pulses. Exam reveals no gallop and no friction rub.  No murmur heard.  Pulmonary/Chest: Few scattered wheezing. She has rhonchi. She has no rales. Moving air fairly lwell Neurological: She is alert and oriented to person, place, and time.  Skin: Skin is warm and dry. No abrasion, no bruising, no ecchymosis and no rash noted. No cyanosis. Nails show no clubbing.  Psychiatric: She has a normal mood and affect. Her speech is normal and behavior is normal.         Assessment & Plan:  1)  INfluenza  .  Tamiflu bid 5 days, rest, fluids off of work 2)  Asthma  Albuterol neb today .,  Depomedrol 120 mg  im in office.  Counseled of danger of overuse of albuterol.  She can use albuterol neb TID aat home,  Not to use inhaler unless she awakens at night with wheezing.  She voices understanding.  See me in office on Monday/.  To urgent care if office is closed

## 2011-08-07 ENCOUNTER — Encounter: Payer: Self-pay | Admitting: Internal Medicine

## 2011-08-07 ENCOUNTER — Ambulatory Visit (INDEPENDENT_AMBULATORY_CARE_PROVIDER_SITE_OTHER): Payer: BC Managed Care – PPO | Admitting: Internal Medicine

## 2011-08-07 DIAGNOSIS — J111 Influenza due to unidentified influenza virus with other respiratory manifestations: Secondary | ICD-10-CM

## 2011-08-07 DIAGNOSIS — J45909 Unspecified asthma, uncomplicated: Secondary | ICD-10-CM

## 2011-08-07 DIAGNOSIS — J101 Influenza due to other identified influenza virus with other respiratory manifestations: Secondary | ICD-10-CM

## 2011-08-07 MED ORDER — BUDESONIDE-FORMOTEROL FUMARATE 160-4.5 MCG/ACT IN AERO: 2.0000 | INHALATION_SPRAY | Freq: Two times a day (BID) | RESPIRATORY_TRACT | Status: DC

## 2011-08-07 MED ORDER — METHYLPREDNISOLONE ACETATE PF 80 MG/ML IJ SUSP
120.0000 mg | Freq: Once | INTRAMUSCULAR | Status: AC
  Administered 2011-08-07: 120 mg via INTRAMUSCULAR

## 2011-08-07 NOTE — Patient Instructions (Signed)
Increase Symbicort to 2 inhalation twice a day  See me at end of January

## 2011-08-07 NOTE — Progress Notes (Signed)
Subjective:    Patient ID: Anita Benton, female    DOB: [birth year], 56 y.o.   MRN: [id number]  HPI  Anita Benton is here for follow up.  She states she is feeling at leat 50% better.  Using nebs at home tid and sometimes albuterol at night but not as much.  She has finished Tamiflu and prednisone.  She still wheezes and coughs at home.  No fever. Cough less in frequency.  "I just feel wiped out"   Drinking plenty of fluids but no much solid food  She is only using her Symbicort once a day  Not twice  Allergies  Allergen Reactions  . Rice Shortness Of Breath and Rash  . Levaquin Itching and Swelling    Lip swelling  . Codeine Rash   Past Medical History  Diagnosis Date  . Shingles   . Arthritis   . Asthma   . Hyperlipidemia   . Vitamin D deficiency   . Allergy   . Itching   . Keloid   . Menopause    Past Surgical History  Procedure Date  . Vein surgery   . Abdominal hysterectomy   . Cesarean section 1974, 1977, 1979   History   Social History  . Marital Status: Divorced    Spouse Name: N/A    Number of Children: N/A  . Years of Education: N/A   Occupational History  . Not on file.   Social History Main Topics  . Smoking status: Never Smoker   . Smokeless tobacco: Never Used  . Alcohol Use: No  . Drug Use: No  . Sexually Active: Yes    Birth Control/ Protection: Surgical   Other Topics Concern  . Not on file   Social History Narrative  . No narrative on file   Family History  Problem Relation Age of Onset  . Cancer Mother     lung   Patient Active Problem List  Diagnoses  . Arthritis  . Asthma  . Hyperlipidemia  . Shingles  . Vitamin D deficiency  . Allergy  . Keloid  . Menopause  . DVT, lower extremity  . Varicose veins  . Urinary tract infection, site not specified  . Nevus   Current Outpatient Prescriptions on File Prior to Visit  Medication Sig Dispense Refill  . albuterol (PROVENTIL HFA;VENTOLIN HFA) 108 (90 BASE) MCG/ACT inhaler  Inhale 2 puffs into the lungs every 6 (six) hours as needed. For shortness of breath or wheezing  1 Inhaler  1  . atorvastatin (LIPITOR) 10 MG tablet Take 1 tablet (10 mg total) by mouth at bedtime.  30 tablet  1  . budesonide (RHINOCORT AQUA) 32 MCG/ACT nasal spray Place 1 spray into the nose daily.  1 Bottle  1  . chlorpheniramine-HYDROcodone (TUSSIONEX) 10-8 MG/5ML LQCR Take 5 mLs by mouth every 12 (twelve) hours as needed.        Marland Kitchen escitalopram (LEXAPRO) 10 MG tablet Take 1 tablet (10 mg total) by mouth daily.  30 tablet  2  . hydrOXYzine (ATARAX/VISTARIL) 25 MG tablet Take 25 mg by mouth 2 (two) times daily as needed.        . montelukast (SINGULAIR) 10 MG tablet Take 1 tablet (10 mg total) by mouth at bedtime.  30 tablet  1   No current facility-administered medications on file prior to visit.        Review of Systems See HPI    Objective:   Physical Exam  Physical Exam  Nursing  note and vitals reviewed.   Peak flow 330 today Constitutional: She is oriented to person, place, and time. She appears well-developed and well-nourished.  HENT:  Head: Normocephalic and atraumatic.  Cardiovascular: Normal rate and regular rhythm. Exam reveals no gallop and no friction rub.  No murmur heard.  Pulmonary/Chest: Breath sounds normal. She has no wheezes. She has no rales.  Moving air much better today  Neurological: She is alert and oriented to person, place, and time.  Skin: Skin is warm and dry.  Psychiatric: She has a normal mood and affect. Her behavior is normal.      Assessment & Plan:  1) INfluenza  Improving 2)  Asthma  Some improvement  Advised to increase Symbicort to 2 ihalations bid.  Will give second dose of Depomedrol 120 mg today  .  Recheck with me end of January or sooner prn

## 2011-08-16 ENCOUNTER — Ambulatory Visit: Payer: Self-pay | Admitting: Internal Medicine

## 2011-08-21 ENCOUNTER — Other Ambulatory Visit: Payer: Self-pay | Admitting: Emergency Medicine

## 2011-08-21 MED ORDER — MONTELUKAST SODIUM 10 MG PO TABS: 10.0000 mg | ORAL_TABLET | Freq: Every day | ORAL | Status: DC

## 2011-08-21 NOTE — Telephone Encounter (Signed)
Refill request for montelukast received.  Last filled 07/10/11

## 2011-08-22 ENCOUNTER — Ambulatory Visit (INDEPENDENT_AMBULATORY_CARE_PROVIDER_SITE_OTHER): Payer: BC Managed Care – PPO | Admitting: Internal Medicine

## 2011-08-22 ENCOUNTER — Encounter: Payer: Self-pay | Admitting: Internal Medicine

## 2011-08-22 VITALS — BP 122/75 | HR 76 | Temp 96.9°F | Resp 12 | Ht 64.5 in | Wt 195.1 lb

## 2011-08-22 DIAGNOSIS — R079 Chest pain, unspecified: Secondary | ICD-10-CM

## 2011-08-22 DIAGNOSIS — J111 Influenza due to unidentified influenza virus with other respiratory manifestations: Secondary | ICD-10-CM

## 2011-08-22 DIAGNOSIS — L0390 Cellulitis, unspecified: Secondary | ICD-10-CM

## 2011-08-22 DIAGNOSIS — L0291 Cutaneous abscess, unspecified: Secondary | ICD-10-CM

## 2011-08-22 MED ORDER — AMOXICILLIN-POT CLAVULANATE 500-125 MG PO TABS: 1.0000 | ORAL_TABLET | Freq: Three times a day (TID) | ORAL | Status: AC

## 2011-08-22 NOTE — Progress Notes (Signed)
Addended by: Raechel Chute D on: 08/22/2011 09:47 AM   Modules accepted: Orders

## 2011-08-22 NOTE — Patient Instructions (Signed)
To see Dr. Mina Marble in follow up  See me as needed

## 2011-08-22 NOTE — Progress Notes (Signed)
Subjective:    Patient ID: Anita Benton, female    DOB: 1955-12-10, 56 y.o.   MRN: [id number]  HPI  Anita Benton is here for follow up.  Respiratory symptoms improved but she has a new issue involving her finger.  She describes that in October she had a work related accident where a piece of metal lodges in the 3rd digit of R hand.  It was operated on by Dr. Mina Benton and improved.  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  3 days ago finger was swollen, red and "popped open" with pus draining from tip of finger  Swelling improved now but still painful and slightly red.  No dodumented fever.  Pt has pitcuture on her cell phone which shows a purulent appearing fingertip of involved finger.  She had a few minutes of L sided chest pain over week-end.  Some sweating but no radiation  To L arm or jaw,  No SOB, no N/V  She was trying to have a bowel movement and pain relieved after moving bowels.  She declines an EKG today  Allergies  Allergen Reactions  . Rice Shortness Of Breath and Rash  . Levaquin Itching and Swelling    Lip swelling  . Codeine Rash   Past Medical History  Diagnosis Date  . Shingles   . Arthritis   . Asthma   . Hyperlipidemia   . Vitamin D deficiency   . Allergy   . Itching   . Keloid   . Menopause    Past Surgical History  Procedure Date  . Vein surgery   . Abdominal hysterectomy   . Cesarean section 1974, 1977, 1979   History   Social History  . Marital Status: Divorced    Spouse Name: N/A    Number of Children: N/A  . Years of Education: N/A   Occupational History  . Not on file.   Social History Main Topics  . Smoking status: Never Smoker   . Smokeless tobacco: Never Used  . Alcohol Use: No  . Drug Use: No  . Sexually Active: Yes    Birth Control/ Protection: Surgical   Other Topics Concern  . Not on file   Social History Narrative  . No narrative on file   Family History  Problem Relation Age of Onset  . Cancer  Mother     lung   Patient Active Problem List  Diagnoses  . Arthritis  . Asthma  . Hyperlipidemia  . Shingles  . Vitamin D deficiency  . Allergy  . Keloid  . Menopause  . DVT, lower extremity  . Varicose veins  . Urinary tract infection, site not specified  . Nevus   Current Outpatient Prescriptions on File Prior to Visit  Medication Sig Dispense Refill  . albuterol (PROVENTIL HFA;VENTOLIN HFA) 108 (90 BASE) MCG/ACT inhaler Inhale 2 puffs into the lungs every 6 (six) hours as needed. For shortness of breath or wheezing  1 Inhaler  1  . atorvastatin (LIPITOR) 10 MG tablet Take 1 tablet (10 mg total) by mouth at bedtime.  30 tablet  1  . budesonide (RHINOCORT AQUA) 32 MCG/ACT nasal spray Place 1 spray into the nose daily.  1 Bottle  1  . budesonide-formoterol (SYMBICORT) 160-4.5 MCG/ACT inhaler Inhale 2 puffs into the lungs 2 (two) times daily.  1 Inhaler  1  . escitalopram (LEXAPRO) 10 MG tablet Take 1 tablet (10 mg total) by mouth daily.  30 tablet  2  . hydrOXYzine (ATARAX/VISTARIL) 25 MG tablet Take 25 mg by mouth 2 (two) times daily as needed.        . montelukast (SINGULAIR) 10 MG tablet Take 1 tablet (10 mg total) by mouth at bedtime.  30 tablet  1      Review of Systems See HPI    Objective:   Physical ExamPhysical Exam  Nursing note and vitals reviewed.  Constitutional: She is oriented to person, place, and time. She appears well-developed and well-nourished.  HENT:  Head: Normocephalic and atraumatic.  Cardiovascular: Normal rate and regular rhythm. Exam reveals no gallop and no friction rub.  No murmur heard.  Pulmonary/Chest: Breath sounds normal. She has no wheezes. She has no rales.  Neurological: She is alert and oriented to person, place, and time.  Skin: Skin is warm and dry.  Psychiatric: She has a normal mood and affect. Her behavior is normal. R hand: 3rd digit R hand has healing open area on tip and slightly reddened with slight edema.                 Assessment & Plan:  1)  Abcess fingertip R hand:  Since this may be related to injury with metal object back in October,  Will refer to Dr. Mina Benton.  I will start on Augmentin 500 mg tid .  I counseled pt. This will likely need X-ray and/or further surgical exploration depending or results of imaging.  Since this is worker's comp related, will refer back to original care. She voices understanding. 2)  Chest pain:  I counseled signs of heart pain and counseled pt if any recurrence to go for urgent evaluation and EKG when episode occurs.  She declines EKG today.  She voices understanding 3)  Influenza  Improved.

## 2011-08-28 ENCOUNTER — Telehealth: Payer: Self-pay | Admitting: Internal Medicine

## 2011-08-28 ENCOUNTER — Ambulatory Visit: Payer: BC Managed Care – PPO | Admitting: Internal Medicine

## 2011-08-28 NOTE — Telephone Encounter (Signed)
Spoke with Darius.  She states that she saw her original workers comp MD last week and he told her he did not know what was wrong with her finger.  He advised her to soak and massage it.  She has an appointment with Dr. Ilsa Iha for a second opinion this Thursday.  She just wanted to keep DDS informed.  Secondly, she looked at the [facility] for a form for DDS to complete for scholarship for reduced rate of membership.  They do not have a form.  If DDS could write a letter stating how membership could help her chronic medical conditions and mail completed letter to her home address, she would be very thankful.

## 2011-08-28 NOTE — Telephone Encounter (Signed)
Pt called requesting a return call back; please call [phone]; thanks

## 2011-08-30 ENCOUNTER — Encounter: Payer: Self-pay | Admitting: Internal Medicine

## 2011-08-31 ENCOUNTER — Encounter: Payer: Self-pay | Admitting: *Deleted

## 2011-08-31 ENCOUNTER — Ambulatory Visit (INDEPENDENT_AMBULATORY_CARE_PROVIDER_SITE_OTHER): Payer: Worker's Compensation | Admitting: Internal Medicine

## 2011-08-31 ENCOUNTER — Encounter: Payer: Self-pay | Admitting: Internal Medicine

## 2011-08-31 VITALS — BP 127/85 | HR 82 | Temp 97.9°F | Ht 64.0 in | Wt 198.5 lb

## 2011-08-31 DIAGNOSIS — S61209A Unspecified open wound of unspecified finger without damage to nail, initial encounter: Secondary | ICD-10-CM | POA: Insufficient documentation

## 2011-08-31 LAB — CBC WITH DIFFERENTIAL/PLATELET
Eosinophils Absolute: 0.1 10*3/uL (ref 0.0–0.7)
Eosinophils Relative: 1 % (ref 0–5)
HCT: 41.5 % (ref 36.0–46.0)
Hemoglobin: 13.8 g/dL (ref 12.0–15.0)
Lymphs Abs: 2.4 10*3/uL (ref 0.7–4.0)
MCH: 30.1 pg (ref 26.0–34.0)
MCV: 90.4 fL (ref 78.0–100.0)
Monocytes Absolute: 0.6 10*3/uL (ref 0.1–1.0)
Monocytes Relative: 9 % (ref 3–12)
RBC: 4.59 MIL/uL (ref 3.87–5.11)

## 2011-08-31 LAB — SEDIMENTATION RATE: Sed Rate: 10 mm/hr (ref 0–22)

## 2011-08-31 NOTE — Progress Notes (Signed)
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   MRN: [id number]  HPI Anita Benton is a 55yo AAF with history of asthma, hyperlipidemia who injured her long finger on the volar aspect of right hand at work in Oct 2012. She states that she was stabbed by foreign object as she was handling a tray on volar aspect of right long finger. She underwent at [facility] on 10/3 by hand surgeon, Dr. Mina Marble, who removed debris in addition to receiving whirlpool therapy. On follow up on 10/11, appears that her healing process was doing well in addition to being on doxy 100mg  bid. On 10/18, she had no signs of drainage or warmth noted on exam from her return appt notes. And was able to return to work on 10/22. She presents today for evaluation of her right middle finger. She denies any fever, chills, pain, redness or drainage or swelling to her affected hand. She does not have complete range of motion, predominantly flexion as her unaffected finger,but she is still able to use her hand sufficiently with this decreased ROM. Among her paperwork, there does not appear to have any micro results from her original i x d.  Prior to Admission medications   Medication Sig Start Date End Date Taking? Authorizing Provider  albuterol (PROVENTIL HFA;VENTOLIN HFA) 108 (90 BASE) MCG/ACT inhaler Inhale 2 puffs into the lungs every 6 (six) hours as needed. For shortness of breath or wheezing 06/13/11  Yes Kendrick Ranch, MD  atorvastatin (LIPITOR) 10 MG tablet Take 1 tablet (10 mg total) by mouth daily. 12/11/11   Kendrick Ranch, MD  budesonide (RHINOCORT AQUA) 32 MCG/ACT nasal spray Place 1 spray into the nose daily. 12/11/11   Kendrick Ranch, MD  budesonide-formoterol (SYMBICORT) 160-4.5 MCG/ACT inhaler Inhale 2 puffs into the lungs 2 (two) times daily. 12/11/11 12/10/12  Kendrick Ranch, MD  escitalopram (LEXAPRO) 10 MG tablet Take 1 tablet (10 mg total) by mouth daily. 12/11/11   Kendrick Ranch, MD  hydrOXYzine (ATARAX/VISTARIL) 25 MG tablet Take 25 mg by mouth 2 (two) times daily as needed.      Historical Provider, MD  montelukast (SINGULAIR) 10 MG tablet Take 1 tablet (10 mg total) by mouth at bedtime. 12/11/11   Kendrick Ranch, MD   Active Ambulatory Problems    Diagnosis Date Noted  . Arthritis   . Asthma   . Hyperlipidemia   . Shingles   . Vitamin d deficiency   . Allergy   . Keloid   . Menopause   . DVT, lower extremity 06/13/2011  . Varicose veins 06/27/2011  . Urinary tract infection, site not specified 06/27/2011  . Nevus 06/27/2011  . Finger, open wounds, complicated 08/31/2011   Resolved Ambulatory Problems    Diagnosis Date Noted  . No Resolved Ambulatory Problems   Past Medical History  Diagnosis Date  . Itching    Allergies  Allergen Reactions  . Rice Shortness Of Breath and Rash  . Levofloxacin Itching and Swelling    Lip swelling  . Codeine Rash   History  Substance Use Topics  . Smoking status: Never Smoker   . Smokeless tobacco: Never Used  . Alcohol Use: No  family history includes Cancer in her mother.   Review of Systems  Constitutional: Negative for fever, chills, diaphoresis, activity change, appetite  change, fatigue and unexpected weight change.  HENT: Negative for congestion, sore throat, rhinorrhea, sneezing, trouble swallowing and sinus pressure.  Eyes: Negative for photophobia and visual disturbance.  Respiratory: Negative for cough, chest tightness, shortness of breath, wheezing and stridor.  Cardiovascular: Negative for chest pain, palpitations and leg swelling.  Gastrointestinal: Negative for nausea, vomiting, abdominal pain, diarrhea, constipation, blood in stool, abdominal distention and anal bleeding.  Genitourinary: Negative for dysuria, hematuria, flank pain and difficulty urinating.    Musculoskeletal: Negative for myalgias, back pain, joint swelling, arthralgias and gait problem.  Skin: Negative for color change, pallor, rash and wound.  Neurological: Negative for dizziness, tremors, weakness and light-headedness.  Hematological: Negative for adenopathy. Does not bruise/bleed easily.  Psychiatric/Behavioral: Negative for behavioral problems, confusion, sleep disturbance, dysphoric mood, decreased concentration and agitation.       Objective:   Physical Exam BP 127/85  Pulse 82  Temp(Src) 97.9 F (36.6 C) (Oral)  Ht 5\' 4"  (1.626 m)  Wt 198 lb 8 oz (90.039 kg)  BMI 34.07 kg/m2 Physical Exam  Constitutional: she is oriented to person, place, and time. she appears well-developed and well-nourished. No distress.  HENT:  Mouth/Throat: Oropharynx is clear and moist. No oropharyngeal exudate.  Cardiovascular: Normal rate, regular rhythm and normal heart sounds. Exam reveals no gallop and no friction rub.  No murmur heard.  Pulmonary/Chest: Effort normal and breath sounds normal. No respiratory distress. He has no wheezes.  Abdominal: Soft. Bowel sounds are normal. He exhibits no distension. There is no tenderness.  Lymphadenopathy:  He has no cervical adenopathy.  Neurological: mild decrease in range of motion to right middle finger flexion. No change in grip strength 5/5 Skin: Skin is warm and dry. No rash noted. No erythema noted on her right finger. Psychiatric: she has a normal mood and affect. Her behavior is normal.      Assessment & Plan:  Prior soft tissue infection relating to workplace injury s/p i x d and empiric doxycycline = appears clinically improved 3 months out from injury. Recommend not to continue any additional antibiotics at this time unless the patient has worsening pain, swelling or redness to the area, for which we would re-image to see if signs of osteo or deep abscess.  Duke Salvia Drue Second MD MPH [facility] [phone]

## 2011-09-11 ENCOUNTER — Other Ambulatory Visit: Payer: Self-pay | Admitting: Internal Medicine

## 2011-10-26 ENCOUNTER — Other Ambulatory Visit: Payer: Self-pay | Admitting: Internal Medicine

## 2011-12-04 ENCOUNTER — Telehealth: Payer: Self-pay | Admitting: Internal Medicine

## 2011-12-04 NOTE — Telephone Encounter (Signed)
Pt needs her antidepressant pills refill. She states her hot flashes are getting worse. Please refill downstairs. Thanks

## 2011-12-05 ENCOUNTER — Telehealth: Payer: Self-pay | Admitting: Internal Medicine

## 2011-12-05 MED ORDER — BUDESONIDE 32 MCG/ACT NA SUSP: 1.0000 | Freq: Every day | NASAL | Status: DC

## 2011-12-05 MED ORDER — ESCITALOPRAM OXALATE 10 MG PO TABS: 10.0000 mg | ORAL_TABLET | Freq: Every day | ORAL | Status: DC

## 2011-12-05 MED ORDER — BUDESONIDE-FORMOTEROL FUMARATE 160-4.5 MCG/ACT IN AERO: 2.0000 | INHALATION_SPRAY | Freq: Two times a day (BID) | RESPIRATORY_TRACT | Status: DC

## 2011-12-05 MED ORDER — MONTELUKAST SODIUM 10 MG PO TABS: 10.0000 mg | ORAL_TABLET | Freq: Every day | ORAL | Status: DC

## 2011-12-05 MED ORDER — ATORVASTATIN CALCIUM 10 MG PO TABS: 10.0000 mg | ORAL_TABLET | Freq: Every day | ORAL | Status: DC

## 2011-12-05 NOTE — Telephone Encounter (Signed)
Pt needs either you to fax in or call in the following five prescriptions with her NAME, D.O.B, PHONE NUMBER, AND INSURANCE ID #:  Budesonide (Suspension) RHINOCORT AQUA 32 MCG/ACT PLACE 1 SPRAY INTO THE NOSE DAILY.  Budesonide-Formoterol Fumarate (Aerosol) SYMBICORT 160-4.5 MCG/ACT Inhale 2 puffs into the lungs 2 (two) times daily.  Montelukast Sodium (Tab) SINGULAIR 10 MG Take 1 tablet (10 mg total) by mouth at bedtime.  Atorvastatin Calcium (Tab) LIPITOR 10 MG TAKE 1 TABLET (10 MG TOTAL) BY MOUTH AT BEDTIME.  AND THE ANTIDEPRESSANT PILL (sorry I don't know the name)  She needs them to be a three month supply; this the only way she can get her medication.   FAX #: [phone] PHONE#: [phone]  PLEASE CALL; they are not allowing her to get her antidepressant downstairs, because they need your authorization.   If you have any questions please call [place] [phone]

## 2011-12-11 ENCOUNTER — Telehealth: Payer: Self-pay | Admitting: Internal Medicine

## 2011-12-11 DIAGNOSIS — F329 Major depressive disorder, single episode, unspecified: Secondary | ICD-10-CM

## 2011-12-11 DIAGNOSIS — F32A Depression, unspecified: Secondary | ICD-10-CM

## 2011-12-11 DIAGNOSIS — E785 Hyperlipidemia, unspecified: Secondary | ICD-10-CM

## 2011-12-11 MED ORDER — ESCITALOPRAM OXALATE 10 MG PO TABS: 10.0000 mg | ORAL_TABLET | Freq: Every day | ORAL | Status: DC

## 2011-12-11 MED ORDER — BUDESONIDE 32 MCG/ACT NA SUSP: 1.0000 | Freq: Every day | NASAL | Status: DC

## 2011-12-11 MED ORDER — MONTELUKAST SODIUM 10 MG PO TABS: 10.0000 mg | ORAL_TABLET | Freq: Every day | ORAL | Status: DC

## 2011-12-11 MED ORDER — ATORVASTATIN CALCIUM 10 MG PO TABS: 10.0000 mg | ORAL_TABLET | Freq: Every day | ORAL | Status: DC

## 2011-12-11 MED ORDER — BUDESONIDE-FORMOTEROL FUMARATE 160-4.5 MCG/ACT IN AERO: 2.0000 | INHALATION_SPRAY | Freq: Two times a day (BID) | RESPIRATORY_TRACT | Status: DC

## 2011-12-11 NOTE — Telephone Encounter (Addendum)
Called Anita Benton, states she was having trouble getting lexapro fhom her [employer]- wanted to wait until Dr. Constance Goltz had renewed all her meds. Informed Anita Benton Dr. Constance Goltz had sent in refills for 5 of her meds that included lexapro on 12/05/11- patient spoke with them last 12/07/11 , but will call them today.  Informed her if she needs [place] to send in a rx to  A local pharmacy, we will, but she  May need to pay out of pocket for that. Instructed patient to have mail order pharmacy contact us, if there is still an issue , or she may call us back or if she needs [place] to sent rx to local pharmacy.Patient voices understanding. Received call back from Anita Benton that mail order pharmacy did not receive refills. I called [facility] and verified and am resending by eprescribe-

## 2011-12-11 NOTE — Telephone Encounter (Signed)
Bonita Quin please call pt at [phone]  And find out what the issue is with her Lexapro.  I sent a 3 month supply to her mail order on 5/7  If she wants to fill a few outside of mail order, insurance may require her to pay out of pocket.    See last telephone encounter

## 2012-04-01 ENCOUNTER — Observation Stay (HOSPITAL_BASED_OUTPATIENT_CLINIC_OR_DEPARTMENT_OTHER)
Admission: EM | Admit: 2012-04-01 | Discharge: 2012-04-04 | Disposition: A | Discharge status: Home / Self Care | Payer: BC Managed Care – PPO | Attending: Internal Medicine | Admitting: Internal Medicine

## 2012-04-01 ENCOUNTER — Observation Stay (HOSPITAL_COMMUNITY): Payer: BC Managed Care – PPO

## 2012-04-01 ENCOUNTER — Emergency Department (HOSPITAL_BASED_OUTPATIENT_CLINIC_OR_DEPARTMENT_OTHER): Payer: BC Managed Care – PPO

## 2012-04-01 ENCOUNTER — Encounter (HOSPITAL_BASED_OUTPATIENT_CLINIC_OR_DEPARTMENT_OTHER): Payer: Self-pay | Admitting: *Deleted

## 2012-04-01 DIAGNOSIS — R29898 Other symptoms and signs involving the musculoskeletal system: Secondary | ICD-10-CM | POA: Insufficient documentation

## 2012-04-01 DIAGNOSIS — E785 Hyperlipidemia, unspecified: Secondary | ICD-10-CM | POA: Insufficient documentation

## 2012-04-01 DIAGNOSIS — R0602 Shortness of breath: Secondary | ICD-10-CM

## 2012-04-01 DIAGNOSIS — I6350 Cerebral infarction due to unspecified occlusion or stenosis of unspecified cerebral artery: Secondary | ICD-10-CM

## 2012-04-01 DIAGNOSIS — B029 Zoster without complications: Secondary | ICD-10-CM

## 2012-04-01 DIAGNOSIS — G43409 Hemiplegic migraine, not intractable, without status migrainosus: Principal | ICD-10-CM | POA: Insufficient documentation

## 2012-04-01 DIAGNOSIS — R269 Unspecified abnormalities of gait and mobility: Secondary | ICD-10-CM | POA: Insufficient documentation

## 2012-04-01 DIAGNOSIS — J45909 Unspecified asthma, uncomplicated: Secondary | ICD-10-CM

## 2012-04-01 DIAGNOSIS — M79609 Pain in unspecified limb: Secondary | ICD-10-CM

## 2012-04-01 DIAGNOSIS — M79644 Pain in right finger(s): Secondary | ICD-10-CM | POA: Diagnosis present

## 2012-04-01 DIAGNOSIS — I639 Cerebral infarction, unspecified: Secondary | ICD-10-CM

## 2012-04-01 DIAGNOSIS — Z8673 Personal history of transient ischemic attack (TIA), and cerebral infarction without residual deficits: Secondary | ICD-10-CM | POA: Insufficient documentation

## 2012-04-01 DIAGNOSIS — I10 Essential (primary) hypertension: Secondary | ICD-10-CM | POA: Insufficient documentation

## 2012-04-01 DIAGNOSIS — G43909 Migraine, unspecified, not intractable, without status migrainosus: Secondary | ICD-10-CM

## 2012-04-01 HISTORY — DX: Shortness of breath: R06.02

## 2012-04-01 HISTORY — DX: Migraine, unspecified, not intractable, without status migrainosus: G43.909

## 2012-04-01 HISTORY — DX: Cerebral infarction, unspecified: I63.9

## 2012-04-01 HISTORY — DX: Acute embolism and thrombosis of unspecified deep veins of right lower extremity: I82.401

## 2012-04-01 HISTORY — DX: Zoster without complications: B02.9

## 2012-04-01 LAB — CBC WITH DIFFERENTIAL/PLATELET
Hemoglobin: 13.7 g/dL (ref 12.0–15.0)
Lymphs Abs: 3 10*3/uL (ref 0.7–4.0)
MCH: 29.7 pg (ref 26.0–34.0)
Monocytes Relative: 8 % (ref 3–12)
Neutro Abs: 4.3 10*3/uL (ref 1.7–7.7)
Neutrophils Relative %: 53 % (ref 43–77)
RBC: 4.61 MIL/uL (ref 3.87–5.11)

## 2012-04-01 LAB — CBC
HCT: 39.8 % (ref 36.0–46.0)
MCV: 86.7 fL (ref 78.0–100.0)
RBC: 4.59 MIL/uL (ref 3.87–5.11)
WBC: 7.9 10*3/uL (ref 4.0–10.5)

## 2012-04-01 LAB — HEPATIC FUNCTION PANEL
ALT: 9 U/L (ref 0–35)
Albumin: 3.7 g/dL (ref 3.5–5.2)
Alkaline Phosphatase: 80 U/L (ref 39–117)
Total Protein: 7.4 g/dL (ref 6.0–8.3)

## 2012-04-01 LAB — BASIC METABOLIC PANEL
BUN: 16 mg/dL (ref 6–23)
CO2: 28 mEq/L (ref 19–32)
Chloride: 105 mEq/L (ref 96–112)
Glucose, Bld: 98 mg/dL (ref 70–99)
Potassium: 3.7 mEq/L (ref 3.5–5.1)

## 2012-04-01 LAB — RAPID URINE DRUG SCREEN, HOSP PERFORMED: Amphetamines: NOT DETECTED

## 2012-04-01 LAB — GLUCOSE, CAPILLARY

## 2012-04-01 LAB — CK TOTAL AND CKMB (NOT AT ARMC)
Relative Index: 1 (ref 0.0–2.5)
Total CK: 145 U/L (ref 7–177)

## 2012-04-01 LAB — TROPONIN I: Troponin I: <0.3 ng/mL (ref <0.30)

## 2012-04-01 LAB — CREATININE, SERUM: GFR calc Af Amer: >90 mL/min (ref >90)

## 2012-04-01 LAB — APTT: aPTT: 32 seconds (ref 24–37)

## 2012-04-01 MED ORDER — MORPHINE SULFATE 4 MG/ML IJ SOLN
4.0000 mg | Freq: Once | INTRAMUSCULAR | Status: AC
  Administered 2012-04-01: 4 mg via INTRAVENOUS
  Filled 2012-04-01: qty 1

## 2012-04-01 MED ORDER — FLUTICASONE PROPIONATE 50 MCG/ACT NA SUSP
2.0000 | Freq: Every day | NASAL | Status: DC
  Administered 2012-04-01 – 2012-04-04 (×4): 2 via NASAL
  Filled 2012-04-01 (×2): qty 16

## 2012-04-01 MED ORDER — ALBUTEROL SULFATE HFA 108 (90 BASE) MCG/ACT IN AERS
2.0000 | INHALATION_SPRAY | Freq: Four times a day (QID) | RESPIRATORY_TRACT | Status: DC | PRN
  Filled 2012-04-01: qty 6.7

## 2012-04-01 MED ORDER — ASPIRIN 325 MG PO TABS
325.0000 mg | ORAL_TABLET | Freq: Once | ORAL | Status: AC
  Administered 2012-04-01: 325 mg via ORAL
  Filled 2012-04-01: qty 1

## 2012-04-01 MED ORDER — DOXYCYCLINE HYCLATE 100 MG PO TABS
100.0000 mg | ORAL_TABLET | Freq: Two times a day (BID) | ORAL | Status: DC
  Administered 2012-04-01 – 2012-04-04 (×6): 100 mg via ORAL
  Filled 2012-04-01 (×7): qty 1

## 2012-04-01 MED ORDER — MONTELUKAST SODIUM 10 MG PO TABS
10.0000 mg | ORAL_TABLET | Freq: Every day | ORAL | Status: DC
  Administered 2012-04-01 – 2012-04-03 (×3): 10 mg via ORAL
  Filled 2012-04-01 (×4): qty 1

## 2012-04-01 MED ORDER — ACETAMINOPHEN 325 MG PO TABS: 650.0000 mg | ORAL_TABLET | ORAL | Status: DC | PRN

## 2012-04-01 MED ORDER — BUDESONIDE-FORMOTEROL FUMARATE 160-4.5 MCG/ACT IN AERO
2.0000 | INHALATION_SPRAY | Freq: Two times a day (BID) | RESPIRATORY_TRACT | Status: DC
  Administered 2012-04-01 – 2012-04-04 (×5): 2 via RESPIRATORY_TRACT
  Filled 2012-04-01 (×2): qty 6

## 2012-04-01 MED ORDER — ESCITALOPRAM OXALATE 10 MG PO TABS
10.0000 mg | ORAL_TABLET | Freq: Every day | ORAL | Status: DC
  Administered 2012-04-01 – 2012-04-04 (×4): 10 mg via ORAL
  Filled 2012-04-01 (×4): qty 1

## 2012-04-01 MED ORDER — ATORVASTATIN CALCIUM 10 MG PO TABS
10.0000 mg | ORAL_TABLET | Freq: Every day | ORAL | Status: DC
  Administered 2012-04-01: 10 mg via ORAL
  Filled 2012-04-01 (×2): qty 1

## 2012-04-01 MED ORDER — ONDANSETRON HCL 4 MG/2ML IJ SOLN
4.0000 mg | Freq: Once | INTRAMUSCULAR | Status: AC
  Administered 2012-04-01: 4 mg via INTRAVENOUS
  Filled 2012-04-01: qty 2

## 2012-04-01 MED ORDER — SODIUM CHLORIDE 0.9 % IV SOLN
INTRAVENOUS | Status: DC
  Administered 2012-04-01 – 2012-04-02 (×2): via INTRAVENOUS

## 2012-04-01 MED ORDER — ASPIRIN 325 MG PO TABS
325.0000 mg | ORAL_TABLET | Freq: Every day | ORAL | Status: DC
Start: 2012-04-01 — End: 2012-04-04
  Administered 2012-04-01 – 2012-04-04 (×4): 325 mg via ORAL
  Filled 2012-04-01 (×4): qty 1

## 2012-04-01 MED ORDER — HYDROCODONE-ACETAMINOPHEN 5-325 MG PO TABS
1.0000 | ORAL_TABLET | ORAL | Status: DC | PRN
  Administered 2012-04-01 (×2): 1 via ORAL
  Administered 2012-04-02 – 2012-04-03 (×5): 2 via ORAL
  Filled 2012-04-01 (×2): qty 2
  Filled 2012-04-01: qty 1
  Filled 2012-04-01 (×4): qty 2

## 2012-04-01 MED ORDER — ENOXAPARIN SODIUM 40 MG/0.4ML ~~LOC~~ SOLN
40.0000 mg | SUBCUTANEOUS | Status: DC
  Administered 2012-04-01 – 2012-04-03 (×3): 40 mg via SUBCUTANEOUS
  Filled 2012-04-01 (×4): qty 0.4
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
## 2012-04-01 NOTE — ED Notes (Signed)
Vital signs stable. 

## 2012-04-01 NOTE — ED Notes (Signed)
amb to the bathroom to obtain urine specimen.
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
## 2012-04-01 NOTE — ED Notes (Signed)
Pt. Up to rest room with stedy gait.  Pt. Has clear conscise speech.  No noted neuro deficits.

## 2012-04-01 NOTE — ED Notes (Signed)
Pt. Speaking with family members with no noted numbness or speech deficits.

## 2012-04-01 NOTE — ED Notes (Signed)
Family at bedside. 

## 2012-04-01 NOTE — ED Notes (Signed)
Pt. Asking for pain medicine for her headache.  Pt. Also asking for ginger ale.  Pt. Passed swallow eval and will receive ginger ale pre EDP ok.

## 2012-04-02 ENCOUNTER — Observation Stay (HOSPITAL_COMMUNITY): Payer: BC Managed Care – PPO

## 2012-04-02 LAB — BASIC METABOLIC PANEL
BUN: 15 mg/dL (ref 6–23)
Calcium: 9.1 mg/dL (ref 8.4–10.5)
Creatinine, Ser: 0.7 mg/dL (ref 0.50–1.10)
GFR calc Af Amer: >90 mL/min (ref >90)
GFR calc non Af Amer: >90 mL/min (ref >90)

## 2012-04-02 LAB — GLUCOSE, CAPILLARY: Glucose-Capillary: 96 mg/dL (ref 70–99)

## 2012-04-02 LAB — LUPUS ANTICOAGULANT PANEL

## 2012-04-02 LAB — CBC
HCT: 38.3 % (ref 36.0–46.0)
MCHC: 33.7 g/dL (ref 30.0–36.0)
MCV: 87 fL (ref 78.0–100.0)
RDW: 13.1 % (ref 11.5–15.5)

## 2012-04-02 LAB — CARDIOLIPIN ANTIBODIES, IGG, IGM, IGA
Anticardiolipin IgG: 10 GPL U/mL — ABNORMAL LOW (ref <23)
Anticardiolipin IgM: 2 MPL U/mL — ABNORMAL LOW (ref <11)

## 2012-04-02 LAB — LIPID PANEL
Cholesterol: 181 mg/dL (ref 0–200)
LDL Cholesterol: 79 mg/dL (ref 0–99)
Triglycerides: 265 mg/dL — ABNORMAL HIGH (ref <150)

## 2012-04-02 LAB — HEMOGLOBIN A1C: Mean Plasma Glucose: 117 mg/dL — ABNORMAL HIGH (ref <117)

## 2012-04-02 MED ORDER — SENNOSIDES-DOCUSATE SODIUM 8.6-50 MG PO TABS
2.0000 | ORAL_TABLET | Freq: Every evening | ORAL | Status: DC | PRN
  Administered 2012-04-02: 2 via ORAL
  Filled 2012-04-02: qty 2

## 2012-04-02 MED ORDER — ATORVASTATIN CALCIUM 20 MG PO TABS
20.0000 mg | ORAL_TABLET | Freq: Every day | ORAL | Status: DC
  Administered 2012-04-02 – 2012-04-03 (×2): 20 mg via ORAL
  Filled 2012-04-02 (×3): qty 1

## 2012-04-02 MED ORDER — ZOLPIDEM TARTRATE 5 MG PO TABS: 5.0000 mg | ORAL_TABLET | Freq: Every evening | ORAL | Status: DC | PRN

## 2012-04-02 MED ORDER — ONDANSETRON HCL 4 MG/2ML IJ SOLN
4.0000 mg | Freq: Four times a day (QID) | INTRAMUSCULAR | Status: DC | PRN
  Administered 2012-04-02: 4 mg via INTRAVENOUS
  Filled 2012-04-02: qty 2

## 2012-04-02 MED ORDER — POLYETHYLENE GLYCOL 3350 17 G PO PACK
17.0000 g | PACK | Freq: Every day | ORAL | Status: DC | PRN
  Administered 2012-04-03: 17 g via ORAL
  Filled 2012-04-02 (×2): qty 1

## 2012-04-02 NOTE — Clinical Social Work Psychosocial (Signed)
     Clinical Social Work Department BRIEF PSYCHOSOCIAL ASSESSMENT 04/02/2012  Patient:  Anita Benton, Anita Benton     Account Number:  [id number]     Admit date:  04/01/2012  Clinical Social Worker:  Peggyann Shoals  Date/Time:  04/02/2012 02:23 PM  Referred by:  RN  Date Referred:  04/02/2012 Referred for  SNF Placement   Other Referral:   Interview type:  Family Other interview type:    PSYCHOSOCIAL DATA Living Status:  FAMILY Admitted from facility:   Level of care:   Primary support name:  Lillia Corporal Primary support relationship to patient:  CHILD, ADULT Degree of support available:   Supportive.    CURRENT CONCERNS Current Concerns  Post-Acute Placement   Other Concerns:    SOCIAL WORK ASSESSMENT / PLAN CSW met iwth pt and her family to address consult for SNF and Advanced Directives. CSW introduced herself and explained role of social work. CSW provide Advanced Directive paperwork and explained process of establishing advanced directives. CSW explained the process of SNF placement and process of placement with insurance. Pt's primary insurance is workers' comp.    Pt lives at home with her daughter and grandchildren. Pt also have a very supportive extended family that is local.    CSW will initiate SNF search in [facility] and follow up with bed offers. CSW will also follow up with RNCM regarding status, as pt is "observation." CSW will continue to follow.   Assessment/plan status:  Psychosocial Support/Ongoing Assessment of Needs Other assessment/ plan:   Information/referral to community resources:   SNF List    PATIENTS/FAMILYS RESPONSE TO PLAN OF CARE: Pt was alert and oriented. Pt was very pleasant. Pt is agreeable to SNF placement.

## 2012-04-02 NOTE — Progress Notes (Signed)
Utilization review complete 

## 2012-04-02 NOTE — Progress Notes (Signed)
  Echocardiogram 2D Echocardiogram has been performed.  Cathie Beams 04/02/2012, 3:35 PM

## 2012-04-02 NOTE — Evaluation (Signed)
I have read and agree with the below assessment and plan.   Cristino Degroff Helen Whitlow PT, DPT Pager: [phone] 

## 2012-04-02 NOTE — Progress Notes (Signed)
Occupational Therapy Treatment Patient Details Name: Anita Benton MRN: [id number] DOB: [birth year] Today's Date: 04/02/2012 Time: 9629-5284 OT Time Calculation (min): 26 min  OT Assessment / Plan / Recommendation Comments on Treatment Session      Follow Up Recommendations  Skilled nursing facility    Barriers to Discharge       Equipment Recommendations  None recommended by PT (TBD by OT)    Recommendations for Other Services    Frequency Min 2X/week   Plan      Precautions / Restrictions Precautions Precautions: Fall Restrictions Weight Bearing Restrictions: No   Pertinent Vitals/Pain Pt with no c/o pain at this time          OT Diagnosis: Generalized weakness;Acute pain  OT Problem List: Decreased strength;Decreased activity tolerance;Impaired balance (sitting and/or standing);Decreased knowledge of use of DME or AE;Pain;Impaired UE functional use;Impaired sensation OT Treatment Interventions: Self-care/ADL training;DME and/or AE instruction;Therapeutic activities;Visual/perceptual remediation/compensation;Patient/family education   OT Goals Acute Rehab OT Goals OT Goal Formulation: With patient Time For Goal Achievement: 04/16/12 Potential to Achieve Goals: Good ADL Goals Pt Will Perform Grooming: Independently;Standing at sink ADL Goal: Grooming - Progress: Goal set today Pt Will Perform Upper Body Dressing: Independently;Sitting, bed;Sitting, chair ADL Goal: Upper Body Dressing - Progress: Goal set today Pt Will Perform Lower Body Dressing: with modified independence;Sit to stand from bed;Sit to stand from chair ADL Goal: Lower Body Dressing - Progress: Goal set today Pt Will Transfer to Toilet: with modified independence;Ambulation;with DME ADL Goal: Toilet Transfer - Progress: Goal set today Pt Will Perform Toileting - Clothing Manipulation: with modified independence;Standing ADL Goal: Toileting - Clothing Manipulation - Progress: Goal set today Pt  Will Perform Toileting - Hygiene: Independently;Sitting on 3-in-1 or toilet ADL Goal: Toileting - Hygiene - Progress: Goal set today Pt Will Perform Tub/Shower Transfer: Tub transfer;with modified independence;Ambulation ADL Goal: Tub/Shower Transfer - Progress: Goal set today Additional ADL Goal #1: Pt will be I with RUE strengthening and coordination HEP ADL Goal: Additional Goal #1 - Progress: Progressing toward goals  Visit Information  Last OT Received On: 04/02/12 Assistance Needed: +1 PT/OT Co-Evaluation/Treatment: Yes    Subjective Data  Subjective: My head has really been hurting Patient Stated Goal: Return home   Prior Functioning  Dominant Hand: Right    Cognition  Overall Cognitive Status: Appears within functional limits for tasks assessed/performed Arousal/Alertness: Awake/alert Orientation Level: Appears intact for tasks assessed Behavior During Session: [facility] for tasks performed       Exercises  General Exercises - Upper Extremity Shoulder Flexion: AROM;Right;10 reps;Seated Shoulder Extension: AROM;Right;10 reps;Seated Shoulder Horizontal ABduction: AROM;10 reps;Right;Seated Shoulder Horizontal ADduction: AROM;10 reps;Right;Seated Elbow Flexion: AROM;Right;10 reps;Seated Elbow Extension: AROM;Right;10 reps;Seated Wrist Flexion: AROM;10 reps;Right;Seated Wrist Extension: AROM;Right;10 reps;Seated Digit Composite Flexion: AROM;Right;Seated Composite Extension: AROM;Right;Seated Pt with slow, purposeful movements. Encouraged to perform functional activities with Rt hand (opening and manipulating bottles/containers)   Balance     End of Session OT - End of Session Equipment Utilized During Treatment: Gait belt Activity Tolerance: Patient tolerated treatment well Patient left: in chair;with call bell/phone within reach Nurse Communication: Mobility status  GO Functional Assessment Tool Used: clinical judgement Functional Limitation: Self care Self Care  Current Status (X3244): At least 80 percent but less than 100 percent impaired, limited or restricted Self Care Goal Status (W1027): At least 1 percent but less than 20 percent impaired, limited or restricted   Anita Benton 04/02/2012, 2:34 PM

## 2012-04-02 NOTE — Progress Notes (Addendum)
Patient ID: Anita Benton  female  ZOX:[id number]    DOB: 1956/03/14    DOA: 04/01/2012  PCP: Levon Hedger, MD  Subjective: S: Patient states headache is improving, she feels that her right side is still somewhat weak, on walking she felt, she was dragging her right leg.  Objective: Weight change:   Intake/Output Summary (Last 24 hours) at 04/02/12 1034 Last data filed at 04/01/12 2215  Gross per 24 hour  Intake    120 ml  Output      0 ml  Net    120 ml   Blood pressure 132/80, pulse 62, temperature 97.7 F (36.5 C), temperature source Oral, resp. rate 18, height 5\' 4"  (1.626 m), weight 82.101 kg (181 lb), SpO2 100.00%.  Physical Exam: General: Alert and awake, oriented x3, not in any acute distress. HEENT: anicteric sclera, pupils reactive to light and accommodation, EOMI CVS: S1-S2 clear, no murmur rubs or gallops Chest: clear to auscultation bilaterally, no wheezing, rales or rhonchi Abdomen: soft nontender, nondistended, normal bowel sounds, no organomegaly Extremities: no cyanosis, clubbing or edema noted bilaterally   Lab Results: Basic Metabolic Panel:  Lab 04/02/12 8119 04/01/12 2050 04/01/12 1158  NA 139 -- 142  K 4.3 -- 3.7  CL 102 -- 105  CO2 25 -- 28  GLUCOSE 105* -- 98  BUN 15 -- 16  CREATININE 0.70 0.73 --  CALCIUM 9.1 -- 9.4  MG -- -- --  PHOS -- -- --   Liver Function Tests:  Lab 04/01/12 1150  AST 14  ALT 9  ALKPHOS 80  BILITOT 0.3  PROT 7.4  ALBUMIN 3.7  CBC:  Lab 04/02/12 0437 04/01/12 2050 04/01/12 1158  WBC 7.1 7.9 --  NEUTROABS -- -- 4.3  HGB 12.9 13.7 --  HCT 38.3 39.8 --  MCV 87.0 86.7 --  PLT 279 289 --   Cardiac Enzymes:  Lab 04/01/12 1150  CKTOTAL 145  CKMB 1.5  CKMBINDEX --  TROPONINI <0.30   BNP: No components found with this basename: POCBNP:2 CBG:  Lab 04/02/12 0639 04/01/12 2143  GLUCAP 96 116*     Micro Results: No results found for this or any previous visit (from the past 240  hour(s)).  Studies/Results: Ct Head Wo Contrast  04/01/2012  *RADIOLOGY REPORT*  Clinical Data: Right-sided numbness, blurred vision, headache  CT HEAD WITHOUT CONTRAST  Technique:  Contiguous axial images were obtained from the base of the skull through the vertex without contrast.  Comparison: None.  Findings: There is no evidence of acute intracranial hemorrhage, brain edema, mass lesion, acute infarction,   mass effect, or midline shift. Acute infarct may be inapparent on noncontrast CT. No other intra-axial abnormalities are seen, and the ventricles and sulci are within normal limits in size and symmetry.   No abnormal extra-axial fluid collections or masses are identified.  No significant calvarial abnormality.  IMPRESSION: 1. Negative for bleed or other acute intracranial process.   Original Report Authenticated By: Osa Craver, M.D.    Dg Finger Middle Right  04/01/2012  *RADIOLOGY REPORT*  Clinical Data: 56 year old female with pain.  RIGHT MIDDLE FINGER 2+V  Comparison: 05/01/2011.  Findings: Bone mineralization and joint spaces remain normal.  No fracture or dislocation. No radiopaque foreign body identified.  IMPRESSION: No acute osseous abnormality identified about the right third finger.   Original Report Authenticated By: Harley Hallmark, M.D.     Medications: Scheduled Meds:   . aspirin  325 mg Oral Once  .  aspirin  325 mg Oral Daily  . atorvastatin  10 mg Oral q1800  . budesonide-formoterol  2 puff Inhalation BID  . doxycycline  100 mg Oral Q12H  . enoxaparin  40 mg Subcutaneous Q24H  . escitalopram  10 mg Oral Daily  . fluticasone  2 spray Each Nare Daily  . montelukast  10 mg Oral QHS  .  morphine injection  4 mg Intravenous Once  . ondansetron (ZOFRAN) IV  4 mg Intravenous Once   Continuous Infusions:   . sodium chloride 75 mL/hr at 04/01/12 2215     Assessment/Plan: Principal Problem:  *Right sided weakness: possibly CVA (cerebral infarction) - Stroke w/u  in progress, MRI/MRA head, 2D ECHO, carotid dopplers, pending - Continue full dose ASA, pt/ot eval, lipid panel  Active Problems:  Hyperlipidemia: TG 265, LDL 79 - Placed Lipitor 20 mg daily   HTN (hypertension): Stable for now   Finger pain, right: hx of I&D last year - X-ray of the right finger reviewed, no acute issue/osteo/ abscess. Continue doxycycline and recommended her to follow up with Dr. Mina Marble.   DVT Prophylaxis: lovenox  Code Status: FC  Disposition: not medically ready   LOS: 1 day   Cleola Perryman M.D. [facility] 04/02/2012, 10:34 AM Pager: [phone]  If 7PM-7AM, please contact night-coverage [email or web address] Password [email or web address]  Addendum: MRI of the brain revealed no acute infarct. Right sided weakness possibly TIA, continue workup.  PT recommending skilled nursing facility for rehabilitation. Discussed with Child psychotherapist.   Lavette Yankovich M.D. [facility] 04/02/2012, 3:45 PM  Pager: [phone]

## 2012-04-02 NOTE — Progress Notes (Signed)
VASCULAR LAB PRELIMINARY  PRELIMINARY  PRELIMINARY  PRELIMINARY  Carotid duplex completed.    Preliminary report:  Bilateral:  No evidence of hemodynamically significant internal carotid artery stenosis.   Vertebral artery flow is antegrade.      Anita Benton, RVT 04/02/2012, 3:28 PM

## 2012-04-02 NOTE — Evaluation (Signed)
Occupational Therapy Evaluation Patient Details Name: Anita Benton MRN: [id number] DOB: 06/02/1956 Today's Date: 04/02/2012 Time: 4098-1191 OT Time Calculation (min): 26 min  OT Assessment / Plan / Recommendation Clinical Impression  Pt is a 56 yo female admitted with right sided facial weakness and weakness in RUE. Per pt's chart, CT clear for acute bleed. Pt will benefit from skilled OT in the acute setting to maximize I with ADL and ADL mobility.     OT Assessment  Patient needs continued OT Services    Follow Up Recommendations  Skilled nursing facility    Barriers to Discharge      Equipment Recommendations  None recommended by PT (TBD by OT)    Recommendations for Other Services    Frequency  Min 2X/week    Precautions / Restrictions Precautions Precautions: Fall Restrictions Weight Bearing Restrictions: No   Pertinent Vitals/Pain Pt reports 8/10 right occipital headache- RN informed    ADL  Toilet Transfer: Simulated;Moderate assistance Toilet Transfer Method: Sit to stand Toilet Transfer Equipment:  (bed to chair) Transfers/Ambulation Related to ADLs: +2 total A(pt=80%) for with bil HHA ambulation throughout room    OT Diagnosis: Generalized weakness;Acute pain  OT Problem List: Decreased strength;Decreased activity tolerance;Impaired balance (sitting and/or standing);Decreased knowledge of use of DME or AE;Pain;Impaired UE functional use;Impaired sensation OT Treatment Interventions: Self-care/ADL training;DME and/or AE instruction;Therapeutic activities;Visual/perceptual remediation/compensation;Patient/family education   OT Goals Acute Rehab OT Goals OT Goal Formulation: With patient Time For Goal Achievement: 04/16/12 Potential to Achieve Goals: Good ADL Goals Pt Will Perform Grooming: Independently;Standing at sink ADL Goal: Grooming - Progress: Goal set today Pt Will Perform Upper Body Dressing: Independently;Sitting, bed;Sitting, chair ADL Goal:  Upper Body Dressing - Progress: Goal set today Pt Will Perform Lower Body Dressing: with modified independence;Sit to stand from bed;Sit to stand from chair ADL Goal: Lower Body Dressing - Progress: Goal set today Pt Will Transfer to Toilet: with modified independence;Ambulation;with DME ADL Goal: Toilet Transfer - Progress: Goal set today Pt Will Perform Toileting - Clothing Manipulation: with modified independence;Standing ADL Goal: Toileting - Clothing Manipulation - Progress: Goal set today Pt Will Perform Toileting - Hygiene: Independently;Sitting on 3-in-1 or toilet ADL Goal: Toileting - Hygiene - Progress: Goal set today Pt Will Perform Tub/Shower Transfer: Tub transfer;with modified independence;Ambulation ADL Goal: Tub/Shower Transfer - Progress: Goal set today Additional ADL Goal #1: Pt will be I with RUE strengthening and coordination HEP ADL Goal: Additional Goal #1 - Progress: Goal set today  Visit Information  Last OT Received On: 04/02/12 Assistance Needed: +1 PT/OT Co-Evaluation/Treatment: Yes    Subjective Data  Subjective: My head has really been hurting Patient Stated Goal: Return home   Prior Functioning  Vision/Perception  Home Living Lives With: Daughter (and 3 grandchildren) Available Help at Discharge: Family;Available PRN/intermittently (daughter works at night) Type of Home: House Home Access: Stairs to enter Secretary/administrator of Steps: 1 Home Layout: One level Bathroom Shower/Tub: Engineer, manufacturing systems: Standard Home Adaptive Equipment: Straight cane Prior Function Level of Independence: Independent Able to Take Stairs?: Yes Driving: Yes Vocation: Full time employment Communication Communication: No difficulties Dominant Hand: Right   Vision - Assessment Additional Comments: Pupils equal and reactive to light. Extaocular intact. Pt able to read slowly, but no difficulty maintaining place or moving down to next line.  Cognition   Overall Cognitive Status: Appears within functional limits for tasks assessed/performed Arousal/Alertness: Awake/alert Orientation Level: Appears intact for tasks assessed Behavior During Session: [facility] for tasks performed  Extremity/Trunk Assessment Right Upper Extremity Assessment RUE ROM/Strength/Tone: Deficits RUE ROM/Strength/Tone Deficits: Grossly 4+/5  RUE Sensation: Deficits RUE Sensation Deficits: pt reports decreased sensation in RUE elbow and distally Left Upper Extremity Assessment LUE ROM/Strength/Tone:  (see OT evaluation for details) Right Lower Extremity Assessment RLE ROM/Strength/Tone: Deficits RLE ROM/Strength/Tone Deficits: RLE grossly 3/5 RLE Sensation: Deficits RLE Sensation Deficits: Pt reports numbness and tingling from calf into foot and a "bulging" pain in popliteal area of R L. Left Lower Extremity Assessment LLE ROM/Strength/Tone: WFL for tasks assessed LLE Sensation: WFL - Light Touch   Mobility  Shoulder Instructions  Bed Mobility Bed Mobility: Supine to Sit Supine to Sit: 6: Modified independent (Device/Increase time) Details for Bed Mobility Assistance: Pt requriring increased time for bed mobility but was able to perfrom independently. Transfers Sit to Stand: 4: Min guard;With upper extremity assist;From bed Stand to Sit: 4: Min assist;With armrests;To chair/3-in-1 Details for Transfer Assistance: Pt min guard, requiring verbal cueing for safe hand placement for sit to stand. Pt min assist to slow descent on stand to sit.       Exercise     Balance     End of Session OT - End of Session Equipment Utilized During Treatment: Gait belt Activity Tolerance: Patient tolerated treatment well Patient left: in chair;with call bell/phone within reach Nurse Communication: Mobility status  GO Functional Assessment Tool Used: clinical judgement Functional Limitation: Self care Self Care Current Status (A5409): At least 80 percent but less than 100  percent impaired, limited or restricted Self Care Goal Status (W1191): At least 1 percent but less than 20 percent impaired, limited or restricted   Gessica Jawad 04/02/2012, 1:59 PM

## 2012-04-02 NOTE — Evaluation (Signed)
Physical Therapy Evaluation Patient Details Name: Anita Benton MRN: [id number] DOB: April 20, 1956 Today's Date: 04/02/2012 Time: 3086-5784 PT Time Calculation (min): 24 min  PT Assessment / Plan / Recommendation Clinical Impression  Pt is a 56 yo female admitted with right sided facial weakness and weakness in RUE. Per pt's chart, CT clear for acute bleed. Pt presents to physical therapy evaluation with decreased strength and mobility, as well as decreased sensation. Pt reported "bulging" pain in right popliteal area while up during abulation and nursing was notified of that as well as pt's reported headache of 8/10. Pt will benefit from physical therapy in the acute care setting to maximize functiona nd independence prior to discharge. Attempt use of RW next session to improve pt independence with ambulation. Recommending ST-SNF for rehab at discharge, pending pt progress.    PT Assessment  Patient needs continued PT services    Follow Up Recommendations  Skilled nursing facility;Supervision for mobility/OOB (ST-SNF)    Barriers to Discharge Decreased caregiver support daughter works nights    Equipment Recommendations  None recommended by PT    Recommendations for Other Services     Frequency Min 4X/week    Precautions / Restrictions Precautions Precautions: Fall Restrictions Weight Bearing Restrictions: No         Mobility  Bed Mobility Bed Mobility: Supine to Sit Supine to Sit: 6: Modified independent (Device/Increase time) Details for Bed Mobility Assistance: Pt requriring increased time for bed mobility but was able to perfrom independently. Transfers Transfers: Sit to Stand;Stand to Sit Sit to Stand: 4: Min guard Stand to Sit: 4: Min assist Details for Transfer Assistance: Pt min guard, requiring verbal cueing for safe hand placement for sit to stand. Pt min assist to slow descent on stand to sit. Ambulation/Gait Ambulation/Gait Assistance: 1: +2 Total assist (2  HHA) Ambulation/Gait: Patient Percentage: 80% Ambulation Distance (Feet): 25 Feet Assistive device: 2 person hand held assist Ambulation/Gait Assistance Details: Pt requried verbal cueing for upright posture and assistance for balance, with pt having decreased gait speed and reporting feeling LLE weakness during ambulation. Gait Pattern: Step-through pattern;Decreased stride length;Decreased dorsiflexion - right;Decreased weight shift to right;Trunk flexed;Decreased trunk rotation;Narrow base of support Gait velocity: decreased bil step height General Gait Details: decreased right ankle stability during gait Stairs: No Modified Rankin (Stroke Patients Only) Pre-Morbid Rankin Score: No symptoms Modified Rankin: Moderately severe disability    Exercises     PT Diagnosis: Difficulty walking;Abnormality of gait;Generalized weakness;Acute pain  PT Problem List: Decreased strength;Decreased activity tolerance;Decreased balance;Decreased mobility;Decreased knowledge of use of DME;Impaired sensation PT Treatment Interventions: DME instruction;Gait training;Stair training;Functional mobility training;Therapeutic activities;Therapeutic exercise;Balance training;Patient/family education;Neuromuscular re-education   PT Goals Acute Rehab PT Goals PT Goal Formulation: With patient Time For Goal Achievement: 04/16/12 Potential to Achieve Goals: Good Pt will go Sit to Stand: with modified independence PT Goal: Sit to Stand - Progress: Goal set today Pt will go Stand to Sit: with modified independence PT Goal: Stand to Sit - Progress: Goal set today Pt will Ambulate: >150 feet;with least restrictive assistive device;with modified independence PT Goal: Ambulate - Progress: Goal set today Pt will Go Up / Down Stairs: 1-2 stairs;with supervision PT Goal: Up/Down Stairs - Progress: Goal set today  Visit Information  Last PT Received On: 04/02/12 Assistance Needed: +2 PT/OT Co-Evaluation/Treatment:  Yes    Subjective Data  Subjective: "I have a headache."   Prior Functioning  Home Living Lives With: Daughter (and 3 grandchildren) Available Help at Discharge: Family;Available PRN/intermittently (daughter works  at night) Type of Home: House Home Access: Stairs to enter [employer] of Steps: 1 Home Layout: One level Bathroom Shower/Tub: Engineer, manufacturing systems: Standard Home Adaptive Equipment: Straight cane Prior Function Level of Independence: Independent Able to Take Stairs?: Yes Driving: Yes Vocation: Full time employment Communication Communication: No difficulties    Cognition  Overall Cognitive Status: Appears within functional limits for tasks assessed/performed Arousal/Alertness: Awake/alert Orientation Level: Appears intact for tasks assessed Behavior During Session: [facility] for tasks performed    Extremity/Trunk Assessment Right Upper Extremity Assessment RUE ROM/Strength/Tone:  (see OT evaluation for details) Left Upper Extremity Assessment LUE ROM/Strength/Tone:  (see OT evaluation for details) Right Lower Extremity Assessment RLE ROM/Strength/Tone: Deficits RLE ROM/Strength/Tone Deficits: RLE grossly 3/5 RLE Sensation: Deficits RLE Sensation Deficits: Pt reports numbness and tingling from calf into foot and a "bulging" pain in popliteal area of R L. Left Lower Extremity Assessment LLE ROM/Strength/Tone: WFL for tasks assessed LLE Sensation: WFL - Light Touch   Balance    End of Session PT - End of Session Equipment Utilized During Treatment: Gait belt Activity Tolerance: Patient limited by pain;Patient tolerated treatment well Patient left: in chair;with call bell/phone within reach (with OT) Nurse Communication: Patient requests pain meds;Mobility status  GP     Sadee Osland 04/02/2012, 10:44 AM

## 2012-04-03 DIAGNOSIS — R269 Unspecified abnormalities of gait and mobility: Secondary | ICD-10-CM

## 2012-04-03 LAB — BASIC METABOLIC PANEL
BUN: 13 mg/dL (ref 6–23)
CO2: 27 mEq/L (ref 19–32)
Calcium: 9 mg/dL (ref 8.4–10.5)
Creatinine, Ser: 0.6 mg/dL (ref 0.50–1.10)
Glucose, Bld: 91 mg/dL (ref 70–99)

## 2012-04-03 LAB — GLUCOSE, CAPILLARY
Glucose-Capillary: 88 mg/dL (ref 70–99)
Glucose-Capillary: 97 mg/dL (ref 70–99)
Glucose-Capillary: 98 mg/dL (ref 70–99)

## 2012-04-03 MED ORDER — VALPROATE SODIUM 500 MG/5ML IV SOLN
500.0000 mg | Freq: Once | INTRAVENOUS | Status: AC
  Administered 2012-04-03: 500 mg via INTRAVENOUS
  Filled 2012-04-03: qty 5

## 2012-04-03 MED ORDER — DIPHENHYDRAMINE HCL 50 MG/ML IJ SOLN
25.0000 mg | Freq: Once | INTRAMUSCULAR | Status: AC
  Administered 2012-04-03: 25 mg via INTRAVENOUS
  Filled 2012-04-03: qty 1
  Filled 2012-04-03: qty 0.5

## 2012-04-03 MED ORDER — PROCHLORPERAZINE EDISYLATE 5 MG/ML IJ SOLN
10.0000 mg | Freq: Once | INTRAMUSCULAR | Status: AC
  Administered 2012-04-03: 10 mg via INTRAVENOUS
  Filled 2012-04-03: qty 2

## 2012-04-03 MED ORDER — MAGNESIUM CITRATE PO SOLN
1.0000 | Freq: Once | ORAL | Status: AC
  Administered 2012-04-03: 1 via ORAL
  Filled 2012-04-03: qty 296

## 2012-04-03 NOTE — Clinical Social Work Note (Addendum)
CSW met with pt to address discharge plan. Pt shared that she would like to return home and inquired about PT at home. Pt stated that she had 24 hour supervision and has a strong family support system to assist. Pt declined SNF at this time. CSW explained briefly the process of discharging home with home health services and stated that the [facility] would meet with pt to address her questions furhter. CSW also inquired about advanced directives. Pt stated that had not looked over them and was not interested in having them notarized at this time. CSW is signing off as no further needs identified at this time. Please reconsult if a need arises prior to discharge.   Dede Query, MSW, [facility] [phone]  Addendum: CSW will alert RNCM of change in discharge plan.  Dede Query, MSW, [facility]

## 2012-04-03 NOTE — Progress Notes (Signed)
I have read and agree with the below treatment note and plan. Aaliah Jorgenson Helen Whitlow PT, DPT Pager: [phone] 

## 2012-04-03 NOTE — Progress Notes (Signed)
Physical Therapy Treatment Patient Details Name: Anita Benton MRN: [id number] DOB: Oct 11, 1955 Today's Date: 04/03/2012 Time: 2956-2130 PT Time Calculation (min): 19 min  PT Assessment / Plan / Recommendation Comments on Treatment Session  Pt given RW this session for ambulation, which improved pt's stability with gait, increasing pt's gait speed and improving balance. Pt complained of right heel pain with weight bearing through R LE and of increased numbness in right arm and leg; nursing notified. Continuing to recommend ST-SNF at discharge, however if pt discharges home, recommend HHPT with supervision for mobility/OOB.    Follow Up Recommendations  Skilled nursing facility;Supervision for mobility/OOB; (however if pt discharges home, recommend HHPT with supervision for mobility/OOB)   Barriers to Discharge        Equipment Recommendations  Rolling walker with 5" wheels    Recommendations for Other Services    Frequency Min 4X/week   Plan Discharge plan remains appropriate;Frequency remains appropriate;Equipment recommendations need to be updated    Precautions / Restrictions Precautions Precautions: Fall Restrictions Weight Bearing Restrictions: No       Mobility  Bed Mobility Bed Mobility: Supine to Sit;Sitting - Scoot to Edge of Bed Supine to Sit: 6: Modified independent (Device/Increase time) Sitting - Scoot to Edge of Bed: 6: Modified independent (Device/Increase time) Details for Bed Mobility Assistance: Pt ambe to perform independently with increased time. Transfers Transfers: Sit to Stand;Stand to Sit Sit to Stand: 4: Min assist;From bed;With upper extremity assist Stand to Sit: 4: Min assist;To chair/3-in-1;With upper extremity assist Details for Transfer Assistance: Pt required verbal cueing for safe hand placement and facilitation at upper trunk to come to full upright stance. Ambulation/Gait Ambulation/Gait Assistance: 4: Min assist Ambulation Distance  (Feet): 40 Feet Assistive device: Rolling walker Ambulation/Gait Assistance Details: Pt ambulating with RW today, requiring moderate verbal and tactile cueing for safe use of RW.  Gait Pattern: Step-to pattern;Decreased stride length;Decreased dorsiflexion - right;Decreased weight shift to right;Trunk flexed;Decreased trunk rotation;Narrow base of support General Gait Details: decreased step height bil Stairs: No      PT Goals Acute Rehab PT Goals PT Goal: Sit to Stand - Progress: Progressing toward goal PT Goal: Stand to Sit - Progress: Progressing toward goal PT Goal: Ambulate - Progress: Progressing toward goal  Visit Information  Last PT Received On: 04/03/12 Assistance Needed: +1    Subjective Data  Subjective: "I still have a headache."   Cognition  Overall Cognitive Status: Appears within functional limits for tasks assessed/performed Arousal/Alertness: Awake/alert Orientation Level: Appears intact for tasks assessed Behavior During Session: [facility] for tasks performed    Balance     End of Session PT - End of Session Equipment Utilized During Treatment: Gait belt Activity Tolerance: Patient tolerated treatment well;Patient limited by fatigue Patient left: in chair;with call bell/phone within reach Nurse Communication: Mobility status;Patient requests pain meds   GP     Anita Benton 04/03/2012, 10:43 AM

## 2012-04-03 NOTE — Progress Notes (Signed)
Occupational Therapy Treatment Patient Details Name: Anita Benton MRN: [id number] DOB: June 06, 1956 Today's Date: 04/03/2012 Time: 5621-3086 OT Time Calculation (min): 25 min  OT Assessment / Plan / Recommendation Comments on Treatment Session Pt. demonstrates improving independence with BADLs - requires min guard assistance.  She reports that she has 24 hour supervision at home, and would prefer to discharge home rather than SNF.  Pt. reports tingling Rt. hand today    Follow Up Recommendations  Home health OT;Supervision/Assistance - 24 hour    Barriers to Discharge       Equipment Recommendations  Tub/shower bench;Rolling walker with 5" wheels    Recommendations for Other Services    Frequency Min 2X/week   Plan Discharge plan needs to be updated    Precautions / Restrictions Precautions Precautions: Fall Restrictions Weight Bearing Restrictions: No   Pertinent Vitals/Pain     ADL  Upper Body Dressing: Min guard (gown as a robe) Where Assessed - Upper Body Dressing: Supported standing Lower Body Dressing: Performed;Min guard (socks - increased time for Rt.) Where Assessed - Lower Body Dressing: Supported sit to Pharmacist, hospital: Performed;Min Pension scheme manager Method: Sit to Barista: Comfort height toilet Toileting - Clothing Manipulation and Hygiene: Performed;Min guard Where Assessed - Engineer, mining and Hygiene: Standing Transfers/Ambulation Related to ADLs: pt. ambulated with min guard assist ADL Comments: Pt. crosses legs to don lt. sock, but unable to cross Rt. LE over Lt. so therefore, donned sock by leaning forward.  Pt. moved sit to stand with min guard assist.  Pt requires increased time to complete activities.  She reports she donned underpantes earlier with supervision/min guard assist, and brushed teeth with Rt. UE.  Pt. reprorts tingling Rt. UE.   Pt. states that she would prefer to discharge home and  reports that she has 24 hour supervision from family    OT Diagnosis:    OT Problem List:   OT Treatment Interventions:     OT Goals ADL Goals ADL Goal: Upper Body Dressing - Progress: Progressing toward goals ADL Goal: Lower Body Dressing - Progress: Progressing toward goals ADL Goal: Toilet Transfer - Progress: Progressing toward goals ADL Goal: Toileting - Clothing Manipulation - Progress: Progressing toward goals  Visit Information  Last OT Received On: 04/03/12 Assistance Needed: +1    Subjective Data      Prior Functioning       Cognition  Overall Cognitive Status: Appears within functional limits for tasks assessed/performed Arousal/Alertness: Awake/alert Orientation Level: Appears intact for tasks assessed Behavior During Session: [facility] for tasks performed    Mobility  Shoulder Instructions Bed Mobility Bed Mobility: Supine to Sit;Sitting - Scoot to Edge of Bed Supine to Sit: 6: Modified independent (Device/Increase time) Sitting - Scoot to Edge of Bed: 6: Modified independent (Device/Increase time) Details for Bed Mobility Assistance: Pt ambe to perform independently with increased time. Transfers Transfers: Sit to Stand;Stand to Sit Sit to Stand: 4: Min guard;From bed;With upper extremity assist;From toilet Stand to Sit: 4: Min guard;To bed;To toilet;With upper extremity assist Details for Transfer Assistance: Pt. required one verbal cue for hand placement       Exercises      Balance     End of Session OT - End of Session Activity Tolerance: Patient tolerated treatment well Patient left: in bed;with call bell/phone within reach;with family/visitor present  GO     [name], Ursula Alert M 04/03/2012, 1:13 PM

## 2012-04-03 NOTE — Consult Note (Signed)
[facility] CONSULT NOTE     Reason for Consult: HA and right sided decreased sensation.     HPI:    Anita Benton is an 56 y.o. female who presented to the ED due to 24 hour history of HA and left facial numbness. She woke up with these symptoms on Sunday and due to the symptoms not dissipating she went to the ED at [facility].  Patient states she has had Migraine HA since she was a young Child.  She cannot remember what medication she was on at that time. She has seen Dr. Cathie Beams in the past (HA clinic) who would Rx a medication for her HA but cannot remember that medication either.  Her migraine HA usually would consist of photophobia, Phonophobia and bilateral HA.  Today she is complaining of bilateral HA and decreased sensation in right arm and leg and right lower face. She has stated in the past that she has had complicated migraines with decreased sensation. She is also complaining of sensation of dizziness that is intermittent.  No vertigo. When walking with PT she complained of right popliteal discomfort but myself she complained of right heel pain.  Past Medical History  Diagnosis Date  . Shingles 04/01/2012    "have them q year; usually in Nov; for the last 20 years"  . Asthma   . Hyperlipidemia   . Vitamin d deficiency   . Allergy   . Itching   . Keloid     "q where; I have keloid skin"  . Menopause   . Right leg DVT 1980's  . Shortness of breath 04/01/2012    "when my asthma acts up"  . Migraines 04/01/2012  . Stroke 1977; 04/01/2012    denies residual; "from BCP RX"; little weak right arm; right face little numb"  . Arthritis     "ankles; right hip"    Past Surgical History  Procedure Date  . Cesarean section 1974, 1977, 1979  . Abdominal hysterectomy 1980's  . Varicose vein surgery 1970's    bilaterally    Family History  Problem Relation Age of Onset  . Cancer Mother     lung    Social History:  reports that she has never  smoked. She has never used smokeless tobacco. She reports that she does not drink alcohol or use illicit drugs.  Allergies  Allergen Reactions  . Codeine Rash  . Levofloxacin Itching and Swelling    Lip swelling  . Rice Shortness Of Breath and Rash    Medications:    Prior to Admission:  Prescriptions prior to admission  Medication Sig Dispense Refill  . albuterol (PROVENTIL HFA;VENTOLIN HFA) 108 (90 BASE) MCG/ACT inhaler Inhale 2 puffs into the lungs every 6 (six) hours as needed. For shortness of breath or wheezing      . atorvastatin (LIPITOR) 10 MG tablet Take 10 mg by mouth daily.      . budesonide (RHINOCORT AQUA) 32 MCG/ACT nasal spray Place 1 spray into the nose daily.      . budesonide-formoterol (SYMBICORT) 160-4.5 MCG/ACT inhaler Inhale 2 puffs into the lungs 2 (two) times daily.      . diphenhydrAMINE (SOMINEX) 25 MG tablet Take 50 mg by mouth at bedtime as needed. For itching.      . escitalopram (LEXAPRO) 10 MG tablet Take 10 mg by mouth daily.      Marland Kitchen  montelukast (SINGULAIR) 10 MG tablet Take 10 mg by mouth at bedtime.      . hydrOXYzine (ATARAX/VISTARIL) 25 MG tablet Take 25 mg by mouth 2 (two) times daily as needed.         Scheduled:   . aspirin  325 mg Oral Daily  . atorvastatin  20 mg Oral q1800  . budesonide-formoterol  2 puff Inhalation BID  . diphenhydrAMINE  25 mg Intravenous Once  . doxycycline  100 mg Oral Q12H  . enoxaparin  40 mg Subcutaneous Q24H  . escitalopram  10 mg Oral Daily  . fluticasone  2 spray Each Nare Daily  . montelukast  10 mg Oral QHS  . prochlorperazine  10 mg Intravenous Once  . valproate sodium  500 mg Intravenous Once    Review of Systems - General ROS: negative for - chills, fatigue, fever or hot flashes Hematological and Lymphatic ROS: negative for - bruising, fatigue, jaundice or pallor Endocrine ROS: negative for - hair pattern changes, hot flashes, mood swings or skin changes Respiratory ROS: negative for - cough,  hemoptysis, orthopnea or wheezing Cardiovascular ROS: negative for - dyspnea on exertion, orthopnea, palpitations or shortness of breath Gastrointestinal ROS: negative for - abdominal pain, appetite loss, blood in stools, diarrhea or hematemesis Musculoskeletal ROS: positive for - joint pain and muscle pain Neurological ROS: positive for - dizziness, headaches, numbness/tingling and weakness Dermatological ROS: negative for dry skin, pruritus and rash   Blood pressure 140/79, pulse 71, temperature 97.9 F (36.6 C), temperature source Oral, resp. rate 18, height 5\' 4"  (1.626 m), weight 82.101 kg (181 lb), SpO2 99.00%.   Neurologic Examination:   Mental Status: Alert, oriented, thought content appropriate.  Speech fluent without evidence of aphasia.  Able to follow 3 step commands without difficulty. Cranial Nerves: II: visual fields grossly normal, pupils equal, round, reactive to light and accommodation III,IV, VI: ptosis not present, extraocular muscles extra-ocular motions intact bilaterally V,VII: smile symmetric, facial light touch/temperature sensation decreased in V2,3 region on the right.  VIII: hearing normal bilaterally IX,X: gag reflex present XI: trapezius strength/neck flexion strength normal bilaterally XII: tongue strength normal  Motor: Right : Upper extremity    Left:     Upper extremity 4/5 deltoid       5/5 deltoid 4/5 tricep      5/5 tricep 4/5 biceps      5/5 biceps  5/5wrist flexion     5/5 wrist flexion 5/5 wrist extension     5/5 wrist extension 5/5 hand grip      5/5 hand grip  --slight right pronator drift   Lower extremity     Lower extremity 4/5 hip flexor      5/5 hip flexor 5/5 hip adductors     5/5 hip adductors 5/5 hip abductors     5/5 hip abductors 5/5 quadricep      5/5 quadriceps  5/5 hamstrings     5/5 hamstrings 4/5 plantar flexion       5/5 plantar flexion 4/5 plantar extension     5/5 plantar extension Tone and bulk:normal tone  throughout; no atrophy noted Sensory: Decreased in right arm and leg to PP and light touch, impaired proprioception in the right arm.  Deep Tendon Reflexes:  Right: Upper Extremity   Left: Upper extremity   biceps (C-5 to C-6) 3/4   biceps (C-5 to C-6) 3/4 tricep (C7) 3/4    triceps (C7) 3/4 Brachioradialis (C6) 3/4  Brachioradialis (C6) 3/4  Lower  Extremity Lower Extremity  quadriceps (L-2 to L-4) 3/4   quadriceps (L-2 to L-4) 3/4 (left greater than right) Achilles (S1) 2/4   Achilles (S1) 2/4 Plantars: Right: downgoing   Left: mute Cerebellar: Slow to perform FNF on right, normal on left,  normal heel-to-shin test Gait -Patient was able to stand with feet together and eyes closed without balance issues.  Gait was antalgic on the right --patient stated her right heel was painful.    Lab Results  Component Value Date/Time   CHOL 181 04/02/2012  4:37 AM    Results for orders placed during the hospital encounter of 04/01/12 (from the past 48 hour(s))  URINE RAPID DRUG SCREEN (HOSP PERFORMED)     Status: Normal   Collection Time   04/01/12 12:25 PM      Component Value Range Comment   Opiates NONE DETECTED  NONE DETECTED    Cocaine NONE DETECTED  NONE DETECTED    Benzodiazepines NONE DETECTED  NONE DETECTED    Amphetamines NONE DETECTED  NONE DETECTED    Tetrahydrocannabinol NONE DETECTED  NONE DETECTED    Barbiturates NONE DETECTED  NONE DETECTED   CBC     Status: Normal   Collection Time   04/01/12  8:50 PM      Component Value Range Comment   WBC 7.9  4.0 - 10.5 K/uL    RBC 4.59  3.87 - 5.11 MIL/uL    Hemoglobin 13.7  12.0 - 15.0 g/dL    HCT 78.2  95.6 - 21.3 %    MCV 86.7  78.0 - 100.0 fL    MCH 29.8  26.0 - 34.0 pg    MCHC 34.4  30.0 - 36.0 g/dL    RDW 08.6  57.8 - 46.9 %    Platelets 289  150 - 400 K/uL   CREATININE, SERUM     Status: Normal   Collection Time   04/01/12  8:50 PM      Component Value Range Comment   Creatinine, Ser 0.73  0.50 - 1.10 mg/dL    GFR calc non  Af Amer >90  >90 mL/min    GFR calc Af Amer >90  >90 mL/min   HEMOGLOBIN A1C     Status: Abnormal   Collection Time   04/01/12  8:50 PM      Component Value Range Comment   Hemoglobin A1C 5.7 (*) <5.7 %    Mean Plasma Glucose 117 (*) <117 mg/dL   ANTITHROMBIN III     Status: Abnormal   Collection Time   04/01/12  8:50 PM      Component Value Range Comment   AntiThromb III Func 23 (*) 75 - 120 %   PROTEIN C ACTIVITY     Status: Abnormal   Collection Time   04/01/12  8:50 PM      Component Value Range Comment   Protein C Activity 157 (*) 75 - 133 %   PROTEIN S ACTIVITY     Status: Normal   Collection Time   04/01/12  8:50 PM      Component Value Range Comment   Protein S Activity 114  69 - 129 %   LUPUS ANTICOAGULANT PANEL     Status: Normal   Collection Time   04/01/12  8:50 PM      Component Value Range Comment   PTT Lupus Anticoagulant 33.3  28.0 - 43.0 secs    PTTLA Confirmation NOT APPL  <8.0 secs  PTTLA 4:1 Mix NOT APPL  28.0 - 43.0 secs    Drvvt 39.7  <45.1 secs    Drvvt confirmation NOT APPL  <1.16 Ratio    dRVVT Incubated 1:1 Mix NOT APPL  <45.1 secs    Lupus Anticoagulant NOT DETECTED  NOT DETECTED   BETA-2-GLYCOPROTEIN I ABS, IGG/M/A     Status: Normal   Collection Time   04/01/12  8:50 PM      Component Value Range Comment   Beta-2 Glyco I IgG 0  <20 G Units    Beta-2-Glycoprotein I IgM 3  <20 M Units    Beta-2-Glycoprotein I IgA 7  <20 A Units   HOMOCYSTEINE, SERUM     Status: Normal   Collection Time   04/01/12  8:50 PM      Component Value Range Comment   Homocysteine-Norm 11.4  4.0 - 15.4 umol/L   FACTOR 5 LEIDEN     Status: Normal   Collection Time   04/01/12  8:50 PM      Component Value Range Comment   Recommendations-F5LEID: (NOTE)     CARDIOLIPIN ANTIBODIES, IGG, IGM, IGA     Status: Abnormal   Collection Time   04/01/12  8:50 PM      Component Value Range Comment   Anticardiolipin IgG 10 (*) <23 GPL U/mL    Anticardiolipin IgM 2 (*) <11 MPL U/mL     Anticardiolipin IgA 13 (*) <22 APL U/mL   GLUCOSE, CAPILLARY     Status: Abnormal   Collection Time   04/01/12  9:43 PM      Component Value Range Comment   Glucose-Capillary 116 (*) 70 - 99 mg/dL    Comment 1 Documented in Chart     CBC     Status: Normal   Collection Time   04/02/12  4:37 AM      Component Value Range Comment   WBC 7.1  4.0 - 10.5 K/uL    RBC 4.40  3.87 - 5.11 MIL/uL    Hemoglobin 12.9  12.0 - 15.0 g/dL    HCT 78.2  95.6 - 21.3 %    MCV 87.0  78.0 - 100.0 fL    MCH 29.3  26.0 - 34.0 pg    MCHC 33.7  30.0 - 36.0 g/dL    RDW 08.6  57.8 - 46.9 %    Platelets 279  150 - 400 K/uL   BASIC METABOLIC PANEL     Status: Abnormal   Collection Time   04/02/12  4:37 AM      Component Value Range Comment   Sodium 139  135 - 145 mEq/L    Potassium 4.3  3.5 - 5.1 mEq/L HEMOLYSIS AT THIS LEVEL MAY AFFECT RESULT   Chloride 102  96 - 112 mEq/L    CO2 25  19 - 32 mEq/L    Glucose, Bld 105 (*) 70 - 99 mg/dL    BUN 15  6 - 23 mg/dL    Creatinine, Ser 6.29  0.50 - 1.10 mg/dL    Calcium 9.1  8.4 - 52.8 mg/dL    GFR calc non Af Amer >90  >90 mL/min    GFR calc Af Amer >90  >90 mL/min   LIPID PANEL     Status: Abnormal   Collection Time   04/02/12  4:37 AM      Component Value Range Comment   Cholesterol 181  0 - 200 mg/dL    Triglycerides 413 (*) <150  mg/dL    HDL 49  >29 mg/dL    Total CHOL/HDL Ratio 3.7      VLDL 53 (*) 0 - 40 mg/dL    LDL Cholesterol 79  0 - 99 mg/dL   GLUCOSE, CAPILLARY     Status: Normal   Collection Time   04/02/12  6:39 AM      Component Value Range Comment   Glucose-Capillary 96  70 - 99 mg/dL    Comment 1 Documented in Chart      Comment 2 Notify RN     GLUCOSE, CAPILLARY     Status: Abnormal   Collection Time   04/02/12 11:32 AM      Component Value Range Comment   Glucose-Capillary 101 (*) 70 - 99 mg/dL   GLUCOSE, CAPILLARY     Status: Abnormal   Collection Time   04/02/12  6:09 PM      Component Value Range Comment   Glucose-Capillary 114 (*) 70 -  99 mg/dL   GLUCOSE, CAPILLARY     Status: Normal   Collection Time   04/02/12  9:28 PM      Component Value Range Comment   Glucose-Capillary 90  70 - 99 mg/dL    Comment 1 Documented in Chart      Comment 2 Notify RN     BASIC METABOLIC PANEL     Status: Abnormal   Collection Time   04/03/12  6:40 AM      Component Value Range Comment   Sodium 142  135 - 145 mEq/L    Potassium 3.4 (*) 3.5 - 5.1 mEq/L    Chloride 106  96 - 112 mEq/L    CO2 27  19 - 32 mEq/L    Glucose, Bld 91  70 - 99 mg/dL    BUN 13  6 - 23 mg/dL    Creatinine, Ser 5.62  0.50 - 1.10 mg/dL    Calcium 9.0  8.4 - 13.0 mg/dL    GFR calc non Af Amer >90  >90 mL/min    GFR calc Af Amer >90  >90 mL/min   GLUCOSE, CAPILLARY     Status: Normal   Collection Time   04/03/12  6:44 AM      Component Value Range Comment   Glucose-Capillary 88  70 - 99 mg/dL    Comment 1 Documented in Chart      Comment 2 Notify RN     GLUCOSE, CAPILLARY     Status: Normal   Collection Time   04/03/12 11:42 AM      Component Value Range Comment   Glucose-Capillary 97  70 - 99 mg/dL     Mri Brain Without Contrast  04/02/2012  *RADIOLOGY REPORT*  Clinical Data:  56 year old female right side weakness and headache.  Prior stroke.  Comparison: Head CT without contrast 04/01/2012.  MRI HEAD WITHOUT CONTRAST  Technique: Multiplanar, multiecho pulse sequences of the brain and surrounding structures were obtained according to standard protocol without intravenous contrast.  Findings: No restricted diffusion to suggest acute infarction. Partially empty sella.  No ventriculomegaly. No midline shift, mass effect, or evidence of mass lesion.  No acute intracranial hemorrhage identified.  Negative visualized cervicomedullary junction. Major intracranial vascular flow voids are preserved.  Minimal periventricular and other scattered cerebral white matter T2 and FLAIR hyperintensity.  No cortical encephalomalacia. Scattered superimposed dilated perivascular spaces.   Brainstem and cerebellum within normal limits.  Visualized orbit soft tissues are within normal limits.  Visualized paranasal sinuses and mastoids are clear.  Negative scalp soft tissues.  Visible cervical spine shows evidence of disc protrusion at C4-C5 which probably causes a degree of spinal stenosis.  IMPRESSION: 1.  Negative for age noncontrast MRI appearance of the brain. 2.  Mild degenerative spinal stenosis suspected at C4-C5 due to disc protrusion. 3.  MRA findings are below.  MRA HEAD WITHOUT CONTRAST  Technique: Angiographic images of the Circle of Willis were obtained using MRA technique without  intravenous contrast.  Findings: Antegrade flow in the posterior circulation.  Codominant distal vertebral arteries.  Normal PICA vessels.  Normal vertebrobasilar junction and basilar artery.  SCA and left PCA origins are within normal limits.  Fetal type right PCA origin. Bilateral PCA branches are within normal limits.  Antegrade flow in both ICA siphons.  There is irregularity of the left ICA but without hemodynamically significant stenosis. Ophthalmic and right posterior communicating artery origins are within normal limits.  Carotid termini are patent.  Right ACA A1 segment is dominant. Anterior communicating artery and visualized ACA branches are within normal limits.  Visualized bilateral MCA branches are within normal limits.  IMPRESSION: Evidence of some left ICA siphon atherosclerosis.  Otherwise negative intracranial MRA.   Original Report Authenticated By: Harley Hallmark, M.D.    Dg Finger Middle Right  04/01/2012  *RADIOLOGY REPORT*  Clinical Data: 56 year old female with pain.  RIGHT MIDDLE FINGER 2+V  Comparison: 05/01/2011.  Findings: Bone mineralization and joint spaces remain normal.  No fracture or dislocation. No radiopaque foreign body identified.  IMPRESSION: No acute osseous abnormality identified about the right third finger.   Original Report Authenticated By: Harley Hallmark, M.D.     Mr Maxine Glenn Head/brain Wo Cm  04/02/2012  *RADIOLOGY REPORT*  Clinical Data:  56 year old female right side weakness and headache.  Prior stroke.  Comparison: Head CT without contrast 04/01/2012.  MRI HEAD WITHOUT CONTRAST  Technique: Multiplanar, multiecho pulse sequences of the brain and surrounding structures were obtained according to standard protocol without intravenous contrast.  Findings: No restricted diffusion to suggest acute infarction. Partially empty sella.  No ventriculomegaly. No midline shift, mass effect, or evidence of mass lesion.  No acute intracranial hemorrhage identified.  Negative visualized cervicomedullary junction. Major intracranial vascular flow voids are preserved.  Minimal periventricular and other scattered cerebral white matter T2 and FLAIR hyperintensity.  No cortical encephalomalacia. Scattered superimposed dilated perivascular spaces.  Brainstem and cerebellum within normal limits.  Visualized orbit soft tissues are within normal limits.  Visualized paranasal sinuses and mastoids are clear.  Negative scalp soft tissues.  Visible cervical spine shows evidence of disc protrusion at C4-C5 which probably causes a degree of spinal stenosis.  IMPRESSION: 1.  Negative for age noncontrast MRI appearance of the brain. 2.  Mild degenerative spinal stenosis suspected at C4-C5 due to disc protrusion. 3.  MRA findings are below.    MRA HEAD WITHOUT CONTRAST  Technique: Angiographic images of the Circle of Willis were obtained using MRA technique without  intravenous contrast.  Findings: Antegrade flow in the posterior circulation.  Codominant distal vertebral arteries.  Normal PICA vessels.  Normal vertebrobasilar junction and basilar artery.  SCA and left PCA origins are within normal limits.  Fetal type right PCA origin. Bilateral PCA branches are within normal limits.  Antegrade flow in both ICA siphons.  There is irregularity of the left ICA but without hemodynamically significant  stenosis. Ophthalmic and right posterior communicating artery origins are within normal limits.  Carotid  termini are patent.  Right ACA A1 segment is dominant. Anterior communicating artery and visualized ACA branches are within normal limits.  Visualized bilateral MCA branches are within normal limits.  IMPRESSION: Evidence of some left ICA siphon atherosclerosis.  Otherwise negative intracranial MRA.   Original Report Authenticated By: Harley Hallmark, M.D.      Assessment/Plan:   56 YO female with known history of migraine HA now presenting with HA, right facial numbness, right UE and LE decreased sensation and weakness. MRI brain has shown no acute infarct but does show spinal stenosis of C3-4 level.  Unfortunately this was a head MRI and did not fully evaluate C-Spine.  There is a possibility the right facial/extremitiy decreased sensation and weakness could be secondary to the spinal stenosis.  Alternatively, this may very well could be a complicated migraine HA.   Recommend: 1) Obtain a MRI of Cervical spine to further evaluate C-Spine. If MRI shows significant stenosis, would recommend neurosurgical consultation. 2) Give a one time dose of Benadryl, Compazine and Depakote 3) PT for gait 4) Fall precautions    Felicie Morn PA-C [facility] [phone]  04/03/2012, 12:21 PM  I have seen this patient and reviewed this note and made appropriate changes.   Ritta Slot, MD [facility] [phone]

## 2012-04-03 NOTE — Progress Notes (Addendum)
Subjective: Patient states that her symptoms started with  pain in the neck progress to a headache. Then on the next morning she noticed that she was having difficulty walking and and had right-sided arm and leg weakness and facial numbness. She states that her symptoms actually seem to have progressive gotten worse both in terms of her sensation and motor function.  Interval history: MRI of the brain shows no evidence of acute stroke. Patient continues to still have gait abnormalities and problems with balance. I've spoken with physical therapy at this point is recommending residential subcutaneous physical therapy versus home health therapy.  Please note that the patient is well-known to me through our personal relationship with her sister. I spoken with the patient and offered her in alternate physician in the interest of object in confidentiality. The patient has requested that I remain as her attending physician during this hospitalization. Objective: Filed Vitals:   04/03/12 0210 04/03/12 0538 04/03/12 1000 04/03/12 1404  BP: 124/68 115/64 140/79 120/70  Pulse: 65 63 71 63  Temp: 98.1 F (36.7 C) 97.5 F (36.4 C) 97.9 F (36.6 C) 97.9 F (36.6 C)  TempSrc: Oral Oral Oral Oral  Resp: 20 18 18 18   Height:      Weight:      SpO2: 97% 100% 99% 98%   Weight change:   Intake/Output Summary (Last 24 hours) at 04/03/12 1727 Last data filed at 04/03/12 1300  Gross per 24 hour  Intake    720 ml  Output      0 ml  Net    720 ml    General: Alert, awake, oriented x3, in no acute distress. Speech is fluent HEENT: /AT PEERL, EOMI Neck: Trachea midline,  no masses, no thyromegal,y no JVD, no carotid bruit OROPHARYNX:  Moist, No exudate/ erythema/lesions.  Heart: Regular rate and rhythm, without murmurs, rubs, gallops, PMI non-displaced, no heaves or thrills on palpation.  Lungs: Clear to auscultation, no wheezing or rhonchi noted. No increased vocal fremitus resonant to percussion    Abdomen: Soft, nontender, nondistended, positive bowel sounds, no masses no hepatosplenomegaly noted..  Neuro: No focal neurological deficits noted cranial nerves II through XII grossly intact. DTRs 2+ bilaterally upper and lower extremities. Strength 4/5 in proximal muscles of the right upper extremity and 5/5 in the distal muscles of the right upper extremity, and 4/5 in right lower extremity. Strength is 5/5 in the left upper extremity and left lower extremity.     Lab Results:  [facility] 04/03/12 0640 04/02/12 0437  NA 142 139  K 3.4* 4.3  CL 106 102  CO2 27 25  GLUCOSE 91 105*  BUN 13 15  CREATININE 0.60 0.70  CALCIUM 9.0 9.1  MG -- --  PHOS -- --    Basename 04/01/12 1150  AST 14  ALT 9  ALKPHOS 80  BILITOT 0.3  PROT 7.4  ALBUMIN 3.7   No results found for this basename: LIPASE:2,AMYLASE:2 in the last 72 hours  Basename 04/02/12 0437 04/01/12 2050 04/01/12 1158  WBC 7.1 7.9 --  NEUTROABS -- -- 4.3  HGB 12.9 13.7 --  HCT 38.3 39.8 --  MCV 87.0 86.7 --  PLT 279 289 --    Basename 04/01/12 1150  CKTOTAL 145  CKMB 1.5  CKMBINDEX --  TROPONINI <0.30   No components found with this basename: POCBNP:3 No results found for this basename: DDIMER:2 in the last 72 hours  Basename 04/01/12 2050  HGBA1C 5.7*    Basename  04/02/12 0437  CHOL 181  HDL 49  LDLCALC 79  TRIG 265*  CHOLHDL 3.7  LDLDIRECT --   No results found for this basename: TSH,T4TOTAL,FREET3,T3FREE,THYROIDAB in the last 72 hours No results found for this basename: VITAMINB12:2,FOLATE:2,FERRITIN:2,TIBC:2,IRON:2,RETICCTPCT:2 in the last 72 hours  Micro Results: No results found for this or any previous visit (from the past 240 hour(s)).  Studies/Results: Ct Head Wo Contrast  04/01/2012  *RADIOLOGY REPORT*  Clinical Data: Right-sided numbness, blurred vision, headache  CT HEAD WITHOUT CONTRAST  Technique:  Contiguous axial images were obtained from the base of the skull through the vertex  without contrast.  Comparison: None.  Findings: There is no evidence of acute intracranial hemorrhage, brain edema, mass lesion, acute infarction,   mass effect, or midline shift. Acute infarct may be inapparent on noncontrast CT. No other intra-axial abnormalities are seen, and the ventricles and sulci are within normal limits in size and symmetry.   No abnormal extra-axial fluid collections or masses are identified.  No significant calvarial abnormality.  IMPRESSION: 1. Negative for bleed or other acute intracranial process.   Original Report Authenticated By: Osa Craver, M.D.    Mri Brain Without Contrast  04/02/2012  *RADIOLOGY REPORT*  Clinical Data:  56 year old female right side weakness and headache.  Prior stroke.  Comparison: Head CT without contrast 04/01/2012.  MRI HEAD WITHOUT CONTRAST  Technique: Multiplanar, multiecho pulse sequences of the brain and surrounding structures were obtained according to standard protocol without intravenous contrast.  Findings: No restricted diffusion to suggest acute infarction. Partially empty sella.  No ventriculomegaly. No midline shift, mass effect, or evidence of mass lesion.  No acute intracranial hemorrhage identified.  Negative visualized cervicomedullary junction. Major intracranial vascular flow voids are preserved.  Minimal periventricular and other scattered cerebral white matter T2 and FLAIR hyperintensity.  No cortical encephalomalacia. Scattered superimposed dilated perivascular spaces.  Brainstem and cerebellum within normal limits.  Visualized orbit soft tissues are within normal limits.  Visualized paranasal sinuses and mastoids are clear.  Negative scalp soft tissues.  Visible cervical spine shows evidence of disc protrusion at C4-C5 which probably causes a degree of spinal stenosis.  IMPRESSION: 1.  Negative for age noncontrast MRI appearance of the brain. 2.  Mild degenerative spinal stenosis suspected at C4-C5 due to disc protrusion. 3.   MRA findings are below.  MRA HEAD WITHOUT CONTRAST  Technique: Angiographic images of the Circle of Willis were obtained using MRA technique without  intravenous contrast.  Findings: Antegrade flow in the posterior circulation.  Codominant distal vertebral arteries.  Normal PICA vessels.  Normal vertebrobasilar junction and basilar artery.  SCA and left PCA origins are within normal limits.  Fetal type right PCA origin. Bilateral PCA branches are within normal limits.  Antegrade flow in both ICA siphons.  There is irregularity of the left ICA but without hemodynamically significant stenosis. Ophthalmic and right posterior communicating artery origins are within normal limits.  Carotid termini are patent.  Right ACA A1 segment is dominant. Anterior communicating artery and visualized ACA branches are within normal limits.  Visualized bilateral MCA branches are within normal limits.  IMPRESSION: Evidence of some left ICA siphon atherosclerosis.  Otherwise negative intracranial MRA.   Original Report Authenticated By: Harley Hallmark, M.D.    Dg Finger Middle Right  04/01/2012  *RADIOLOGY REPORT*  Clinical Data: 56 year old female with pain.  RIGHT MIDDLE FINGER 2+V  Comparison: 05/01/2011.  Findings: Bone mineralization and joint spaces remain normal.  No fracture or  dislocation. No radiopaque foreign body identified.  IMPRESSION: No acute osseous abnormality identified about the right third finger.   Original Report Authenticated By: Harley Hallmark, M.D.    Mr Maxine Glenn Head/brain Wo Cm  04/02/2012  *RADIOLOGY REPORT*  Clinical Data:  57 year old female right side weakness and headache.  Prior stroke.  Comparison: Head CT without contrast 04/01/2012.  MRI HEAD WITHOUT CONTRAST  Technique: Multiplanar, multiecho pulse sequences of the brain and surrounding structures were obtained according to standard protocol without intravenous contrast.  Findings: No restricted diffusion to suggest acute infarction. Partially empty  sella.  No ventriculomegaly. No midline shift, mass effect, or evidence of mass lesion.  No acute intracranial hemorrhage identified.  Negative visualized cervicomedullary junction. Major intracranial vascular flow voids are preserved.  Minimal periventricular and other scattered cerebral white matter T2 and FLAIR hyperintensity.  No cortical encephalomalacia. Scattered superimposed dilated perivascular spaces.  Brainstem and cerebellum within normal limits.  Visualized orbit soft tissues are within normal limits.  Visualized paranasal sinuses and mastoids are clear.  Negative scalp soft tissues.  Visible cervical spine shows evidence of disc protrusion at C4-C5 which probably causes a degree of spinal stenosis.  IMPRESSION: 1.  Negative for age noncontrast MRI appearance of the brain. 2.  Mild degenerative spinal stenosis suspected at C4-C5 due to disc protrusion. 3.  MRA findings are below.  MRA HEAD WITHOUT CONTRAST  Technique: Angiographic images of the Circle of Willis were obtained using MRA technique without  intravenous contrast.  Findings: Antegrade flow in the posterior circulation.  Codominant distal vertebral arteries.  Normal PICA vessels.  Normal vertebrobasilar junction and basilar artery.  SCA and left PCA origins are within normal limits.  Fetal type right PCA origin. Bilateral PCA branches are within normal limits.  Antegrade flow in both ICA siphons.  There is irregularity of the left ICA but without hemodynamically significant stenosis. Ophthalmic and right posterior communicating artery origins are within normal limits.  Carotid termini are patent.  Right ACA A1 segment is dominant. Anterior communicating artery and visualized ACA branches are within normal limits.  Visualized bilateral MCA branches are within normal limits.  IMPRESSION: Evidence of some left ICA siphon atherosclerosis.  Otherwise negative intracranial MRA.   Original Report Authenticated By: Harley Hallmark, M.D.      Medications: I have reviewed the patient's current medications. Scheduled Meds:   . aspirin  325 mg Oral Daily  . atorvastatin  20 mg Oral q1800  . budesonide-formoterol  2 puff Inhalation BID  . diphenhydrAMINE  25 mg Intravenous Once  . doxycycline  100 mg Oral Q12H  . enoxaparin  40 mg Subcutaneous Q24H  . escitalopram  10 mg Oral Daily  . fluticasone  2 spray Each Nare Daily  . montelukast  10 mg Oral QHS  . prochlorperazine  10 mg Intravenous Once  . valproate sodium  500 mg Intravenous Once   Continuous Infusions:   . sodium chloride 75 mL/hr at 04/02/12 2218   PRN Meds:.acetaminophen, albuterol, HYDROcodone-acetaminophen, ondansetron (ZOFRAN) IV, polyethylene glycol, senna-docusate, zolpidem Assessment/Plan: Patient Active Hospital Problem List: CVA (cerebral infarction) (04/01/2012)   Assessment: Patient has no evidence of CVA on radiologic studies. In light of her significant gait abnormality and asymmetric weakness I've asked neurology to see the patient for further evaluation. I've discussed with the patient the possible need for a CT scan of the neck to further evaluate for pathology in the cervical spinal area.   Suspect complicated migraine (04/03/2012)   Assessment:  Patient's presentation of headache with subsequent gait abnormality could certainly represent a complicated migraine. I will defer to neurology for further input   Hyperlipidemia ()   Assessment: Patient with hyperlipidemia and marked elevation of triglycerides.   HTN (hypertension) (04/01/2012)   Assessment: Blood pressure well controlled at present     Finger pain, right (04/01/2012)   Assessment: Patient has pain in the finger where she had I&D done last year by the hand surgeon. On my evaluation today the patient has no evidence of cellulitic changes however she has been started on doxycycline and is now on telemetry #3. She reports that there's been improvement since the antibiotics have been started  I will continue them for a total of 8 days.    Disposition: The patient likely will be discharged home with home health services.   LOS: 2 days

## 2012-04-04 ENCOUNTER — Observation Stay (HOSPITAL_COMMUNITY): Payer: BC Managed Care – PPO

## 2012-04-04 DIAGNOSIS — G43409 Hemiplegic migraine, not intractable, without status migrainosus: Principal | ICD-10-CM

## 2012-04-04 LAB — BASIC METABOLIC PANEL
Calcium: 9.3 mg/dL (ref 8.4–10.5)
GFR calc Af Amer: >90 mL/min (ref >90)
GFR calc non Af Amer: >90 mL/min (ref >90)
Glucose, Bld: 85 mg/dL (ref 70–99)
Sodium: 141 mEq/L (ref 135–145)

## 2012-04-04 LAB — GLUCOSE, CAPILLARY: Glucose-Capillary: 72 mg/dL (ref 70–99)

## 2012-04-04 MED ORDER — GABAPENTIN 300 MG PO CAPS
300.0000 mg | ORAL_CAPSULE | Freq: Every day | ORAL | Status: DC | PRN
  Filled 2012-04-04: qty 2

## 2012-04-04 MED ORDER — GABAPENTIN 300 MG PO CAPS: 300.0000 mg | ORAL_CAPSULE | Freq: Every day | ORAL | Status: DC | PRN

## 2012-04-04 MED ORDER — ASPIRIN 81 MG PO TBEC: 81.0000 mg | DELAYED_RELEASE_TABLET | Freq: Every day | ORAL | Status: AC

## 2012-04-04 MED ORDER — DOXYCYCLINE HYCLATE 100 MG PO TABS: 100.0000 mg | ORAL_TABLET | Freq: Two times a day (BID) | ORAL | Status: AC

## 2012-04-04 NOTE — Progress Notes (Signed)
PT Cancellation Note  Treatment cancelled today due to patient receiving procedure or test. Patient in MRI. Will recheck later as time allows. Thanks 04/04/2012 Fredrich Birks PTA [phone] pager [phone] office    Fredrich Birks 04/04/2012, 12:04 PM

## 2012-04-04 NOTE — Progress Notes (Signed)
Patient reports headache is mostly gone and tingling/numbness has completely resolved.   Exam:  Awake, Alert, interactive.  Symmetric to light touch sensation.  Able to touch her nose with eyes closed on the right(improvement from yesterday). No pronator Drift.    MRI Cspine- Mild stenosis, but no explanation for her symptoms.   Impression: 56 yo F with complicated migraine that has resolved with migraine treatment. At this time, given the rarity of her migraines, would not start prophylactic, but could start gabapentin PRN.   1) Gabapentin 300 - 600mg  PRN headaches 2) No further recommendations at this time, neurology will sign off.
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
## 2012-04-04 NOTE — Care Management Note (Signed)
    Page 1 of 1   04/04/2012     4:52:24 PM   CARE MANAGEMENT NOTE 04/04/2012  Patient:  Anita Benton, Anita Benton   Account Number:  [id number]  Date Initiated:  04/04/2012  Documentation initiated by:  Onnie Boer  Subjective/Objective Assessment:   PT WAS ADMITTED WITH WEAKNESS     Action/Plan:   PROGRESSION OF CARE AND DISCHARGE PLANNING   Anticipated DC Date:  04/04/2012   Anticipated DC Plan:  HOME W HOME HEALTH SERVICES      DC Planning Services  CM consult      Choice offered to / List presented to:  C-1 Patient   DME arranged  SHOWER STOOL  WALKER - ROLLING      DME agency  [facility]     HH arranged  HH-2 PT  HH-3 OT      [facility] agency  [facility]   Status of service:  Completed, signed off Medicare Important Message given?   (If response is "NO", the following Medicare IM given date fields will be blank) Date Medicare IM given:   Date Additional Medicare IM given:    Discharge Disposition:  HOME W HOME HEALTH SERVICES  Per UR Regulation:  Reviewed for med. necessity/level of care/duration of stay  If discussed at Long Length of Stay Meetings, dates discussed:    Comments:

## 2012-04-04 NOTE — Progress Notes (Signed)
Occupational Therapy Treatment Patient Details Name: Anita Benton MRN: [id number] DOB: [birth year] Today's Date: 04/04/2012 Time: 4098-1191 OT Time Calculation (min): 19 min  OT Assessment / Plan / Recommendation Comments on Treatment Session Pt. almost back to baseline.  She will have 24 hour assist at discharge and is supervision with BADLs.  She will acquire tub seat on her own    Follow Up Recommendations  Home health OT;Supervision/Assistance - 24 hour    Barriers to Discharge       Equipment Recommendations  Rolling walker with 5" wheels    Recommendations for Other Services    Frequency     Plan Discharge plan remains appropriate    Precautions / Restrictions Precautions Precautions: Fall Restrictions Weight Bearing Restrictions: No   Pertinent Vitals/Pain     ADL  Lower Body Dressing: Performed;Supervision/safety Where Assessed - Lower Body Dressing: Supported sit to stand Tub/Shower Transfer: Landscape architect Method: Science writer: Shower seat with back Equipment Used: Rolling walker Transfers/Ambulation Related to ADLs: Pt. ambulating with supervision with RW ADL Comments: Pt. simulated stepping over tub with supervision.  Discussed options for tub seats and she reports she will acquire on her own.  Pt. reports she showered this morning with supervision/set up only.  She is preparing to discharge home, and has no further questions.  She reports numbness in Rt. hand has resolved and coordination is normal    OT Diagnosis:    OT Problem List:   OT Treatment Interventions:     OT Goals ADL Goals ADL Goal: Grooming - Progress: Progressing toward goals ADL Goal: Upper Body Dressing - Progress: Progressing toward goals ADL Goal: Lower Body Dressing - Progress: Progressing toward goals ADL Goal: Toilet Transfer - Progress: Progressing toward goals ADL Goal: Toileting - Clothing Manipulation - Progress:  Progressing toward goals ADL Goal: Toileting - Hygiene - Progress: Progressing toward goals ADL Goal: Tub/Shower Transfer - Progress: Progressing toward goals  Visit Information  Last OT Received On: 04/04/12 Assistance Needed: +1    Subjective Data      Prior Functioning       Cognition  Overall Cognitive Status: Appears within functional limits for tasks assessed/performed Arousal/Alertness: Awake/alert Orientation Level: Appears intact for tasks assessed Behavior During Session: [facility] for tasks performed    Mobility  Shoulder Instructions Bed Mobility Bed Mobility: Supine to Sit;Sitting - Scoot to Edge of Bed Supine to Sit: 7: Independent;HOB flat Sitting - Scoot to Edge of Bed: 7: Independent Transfers Transfers: Sit to Stand;Stand to Sit Sit to Stand: 6: Modified independent (Device/Increase time);From bed;With upper extremity assist Stand to Sit: 6: Modified independent (Device/Increase time);To bed;With upper extremity assist       Exercises      Balance     End of Session OT - End of Session Activity Tolerance: Patient tolerated treatment well Patient left: in bed;with call bell/phone within reach;with family/visitor present  GO Functional Assessment Tool Used: clinical judgement Functional Limitation: Self care Self Care Current Status (Y7829): At least 1 percent but less than 20 percent impaired, limited or restricted Self Care Goal Status (F6213): At least 1 percent but less than 20 percent impaired, limited or restricted Self Care Discharge Status [phone]): At least 1 percent but less than 20 percent impaired, limited or restricted   Cloy Cozzens M 04/04/2012, 1:58 PM

## 2012-04-04 NOTE — Progress Notes (Signed)
Pt was given stroke education/discharge instructions, and beyond hospital book.

## 2012-04-07 ENCOUNTER — Encounter (HOSPITAL_BASED_OUTPATIENT_CLINIC_OR_DEPARTMENT_OTHER): Payer: Self-pay | Admitting: *Deleted

## 2012-04-07 ENCOUNTER — Emergency Department (HOSPITAL_BASED_OUTPATIENT_CLINIC_OR_DEPARTMENT_OTHER)
Admission: EM | Admit: 2012-04-07 | Discharge: 2012-04-08 | Disposition: A | Discharge status: Home / Self Care | Payer: BC Managed Care – PPO | Attending: Emergency Medicine | Admitting: Emergency Medicine

## 2012-04-07 DIAGNOSIS — J45909 Unspecified asthma, uncomplicated: Secondary | ICD-10-CM | POA: Insufficient documentation

## 2012-04-07 DIAGNOSIS — M62838 Other muscle spasm: Secondary | ICD-10-CM

## 2012-04-07 DIAGNOSIS — M542 Cervicalgia: Secondary | ICD-10-CM | POA: Insufficient documentation

## 2012-04-07 DIAGNOSIS — E785 Hyperlipidemia, unspecified: Secondary | ICD-10-CM | POA: Insufficient documentation

## 2012-04-07 DIAGNOSIS — Z7982 Long term (current) use of aspirin: Secondary | ICD-10-CM | POA: Insufficient documentation

## 2012-04-07 DIAGNOSIS — Z8673 Personal history of transient ischemic attack (TIA), and cerebral infarction without residual deficits: Secondary | ICD-10-CM | POA: Insufficient documentation

## 2012-04-07 DIAGNOSIS — Z8739 Personal history of other diseases of the musculoskeletal system and connective tissue: Secondary | ICD-10-CM | POA: Insufficient documentation

## 2012-04-07 DIAGNOSIS — Z86718 Personal history of other venous thrombosis and embolism: Secondary | ICD-10-CM | POA: Insufficient documentation

## 2012-04-07 DIAGNOSIS — Z79899 Other long term (current) drug therapy: Secondary | ICD-10-CM | POA: Insufficient documentation

## 2012-04-07 NOTE — ED Notes (Signed)
Pt presents to ED today with continued neck pain for the last several days.  Pt was seen and admitted for ?stroke and neck pain and released to follow up with neuro.  Pt has appt in the am.  Pt sates "pain is too bad"

## 2012-04-07 NOTE — ED Notes (Signed)
C/o of posterior neck pain that started last night. States she just got out of the hosp on Thursday for a stroke work up and for her neck pain. She is to see a neurologist. Denies injury. States pain is sharp and states pain is constant.  C/o general h/a.  Denies any other symptoms.

## 2012-04-08 ENCOUNTER — Telehealth: Payer: Self-pay | Admitting: Internal Medicine

## 2012-04-08 ENCOUNTER — Emergency Department (HOSPITAL_BASED_OUTPATIENT_CLINIC_OR_DEPARTMENT_OTHER): Payer: BC Managed Care – PPO

## 2012-04-08 LAB — BASIC METABOLIC PANEL
CO2: 25 mEq/L (ref 19–32)
Chloride: 103 mEq/L (ref 96–112)
Creatinine, Ser: 0.7 mg/dL (ref 0.50–1.10)
Glucose, Bld: 100 mg/dL — ABNORMAL HIGH (ref 70–99)

## 2012-04-08 LAB — CBC WITH DIFFERENTIAL/PLATELET
Basophils Absolute: 0 10*3/uL (ref 0.0–0.1)
HCT: 36 % (ref 36.0–46.0)
Hemoglobin: 12.4 g/dL (ref 12.0–15.0)
Lymphocytes Relative: 37 % (ref 12–46)
Lymphs Abs: 3.7 10*3/uL (ref 0.7–4.0)
Monocytes Absolute: 0.9 10*3/uL (ref 0.1–1.0)
Monocytes Relative: 9 % (ref 3–12)
Neutro Abs: 5.3 10*3/uL (ref 1.7–7.7)
RBC: 4.13 MIL/uL (ref 3.87–5.11)
WBC: 9.9 10*3/uL (ref 4.0–10.5)

## 2012-04-08 MED ORDER — KETOROLAC TROMETHAMINE 60 MG/2ML IM SOLN: 60.0000 mg | Freq: Once | INTRAMUSCULAR | Status: DC

## 2012-04-08 MED ORDER — IOHEXOL 350 MG/ML SOLN
100.0000 mL | Freq: Once | INTRAVENOUS | Status: AC | PRN
  Administered 2012-04-08: 100 mL via INTRAVENOUS

## 2012-04-08 MED ORDER — HYDROCODONE-ACETAMINOPHEN 5-500 MG PO TABS: 1.0000 | ORAL_TABLET | Freq: Four times a day (QID) | ORAL | Status: DC | PRN

## 2012-04-08 MED ORDER — METHOCARBAMOL 500 MG PO TABS: 500.0000 mg | ORAL_TABLET | Freq: Two times a day (BID) | ORAL | Status: DC

## 2012-04-08 MED ORDER — KETOROLAC TROMETHAMINE 30 MG/ML IJ SOLN
30.0000 mg | Freq: Once | INTRAMUSCULAR | Status: AC
  Administered 2012-04-08: 30 mg via INTRAVENOUS
  Filled 2012-04-08: qty 1

## 2012-04-08 NOTE — ED Provider Notes (Signed)
History   This chart was scribed for Anita Cheever Smitty Cords, MD by Anita Benton. The patient was seen in room MH02/MH02 and the patient's care was started at 12:27AM.     CSN: [id number]  Arrival date & time 04/07/12  2314   First MD Initiated Contact with Patient 04/08/12 [phone]      Chief Complaint  Patient presents with  . Neck Pain    (Consider location/radiation/quality/duration/timing/severity/associated sxs/prior treatment) The history is provided by the patient and a relative. No language interpreter was used.    Anita Benton is a 56 y.o. female  with a hx of arthritis presents to the Emergency Department complaining progressively worsening neck pain located at the posterior right neck onset yesterday.  The pt reports the neck pain she is experiencing is a sharp, constant pain. She feels like there is a "knot" in the neck at that spot.  She had the same a week ago when she was admitted with CVA and told that it was a muscle spasm and to follow up with her doctors regarding management.  No f/c/r.  No sore throat.  No ear pain.  No CP, sob DOE.  No rashes on the skin.  No visual changes.  No speech changes.  No new weakness. States she has residual weakness in the RLE from the stroke and is supposed to be going for therapy for same  The pt denies speech difficulty, weakness or numbness located in any of the extremities, and shortness of breath.    The pt does not smoke or drink alcohol.     Past Medical History  Diagnosis Date  . Shingles 04/01/2012    "have them q year; usually in Nov; for the last 20 years"  . Asthma   . Hyperlipidemia   . Vitamin d deficiency   . Allergy   . Itching   . Keloid     "q where; I have keloid skin"  . Menopause   . Right leg DVT 1980's  . Shortness of breath 04/01/2012    "when my asthma acts up"  . Migraines 04/01/2012  . Stroke 1977; 04/01/2012    denies residual; "from BCP RX"; little weak right arm; right face little numb"  . Arthritis     "ankles; right hip"    Past Surgical History  Procedure Date  . Cesarean section 1974, 1977, 1979  . Abdominal hysterectomy 1980's  . Varicose vein surgery 1970's    bilaterally    Family History  Problem Relation Age of Onset  . Cancer Mother     lung    History  Substance Use Topics  . Smoking status: Never Smoker   . Smokeless tobacco: Never Used  . Alcohol Use: No    OB History    Grav Para Term Preterm Abortions TAB SAB Ect Mult Living   4 3   1 1           Review of Systems  Constitutional: Negative for fever.  HENT: Positive for neck pain. Negative for neck stiffness.   Eyes: Negative for photophobia and visual disturbance.  Respiratory: Negative for chest tightness, wheezing and stridor.   Cardiovascular: Negative for palpitations and leg swelling.  Neurological: Negative for dizziness, seizures, facial asymmetry, speech difficulty, weakness and numbness.  All other systems reviewed and are negative.    Allergies  Codeine; Levofloxacin; and Rice  Home Medications   Current Outpatient Rx  Name Route Sig Dispense Refill  . ALBUTEROL SULFATE HFA 108 (90  BASE) MCG/ACT IN AERS Inhalation Inhale 2 puffs into the lungs every 6 (six) hours as needed. For shortness of breath or wheezing    . ASPIRIN 81 MG PO TBEC Oral Take 1 tablet (81 mg total) by mouth daily. Swallow whole. 30 tablet 12  . ATORVASTATIN CALCIUM 10 MG PO TABS Oral Take 10 mg by mouth daily.    . BUDESONIDE 32 MCG/ACT NA SUSP Nasal Place 1 spray into the nose daily.    . BUDESONIDE-FORMOTEROL FUMARATE 160-4.5 MCG/ACT IN AERO Inhalation Inhale 2 puffs into the lungs 2 (two) times daily.    Marland Kitchen DIPHENHYDRAMINE HCL (SLEEP) 25 MG PO TABS Oral Take 50 mg by mouth at bedtime as needed. For itching.    Marland Kitchen DOXYCYCLINE HYCLATE 100 MG PO TABS Oral Take 1 tablet (100 mg total) by mouth every 12 (twelve) hours. 6 tablet 0  . ESCITALOPRAM OXALATE 10 MG PO TABS Oral Take 10 mg by mouth daily.    Marland Kitchen GABAPENTIN 300  MG PO CAPS Oral Take 1-2 capsules (300-600 mg total) by mouth daily as needed (Migrane headache). 30 capsule 0  . HYDROXYZINE HCL 25 MG PO TABS Oral Take 25 mg by mouth 2 (two) times daily as needed.      Marland Kitchen MONTELUKAST SODIUM 10 MG PO TABS Oral Take 10 mg by mouth at bedtime.      BP 139/96  Pulse 88  Temp 98.1 F (36.7 C) (Oral)  Resp 21  SpO2 100%  Physical Exam  Nursing note and vitals reviewed. Constitutional: She is oriented to person, place, and time. She appears well-developed and well-nourished. No distress.  HENT:  Head: Normocephalic and atraumatic.  Right Ear: Tympanic membrane and external ear normal. No hemotympanum.  Left Ear: Tympanic membrane and external ear normal. No hemotympanum.  Nose: Nose normal.  Mouth/Throat: Uvula is midline and oropharynx is clear and moist.  Eyes: Conjunctivae and EOM are normal. Pupils are equal, round, and reactive to light.  Neck: Normal range of motion. Neck supple. No JVD present. No tracheal deviation present. No Brudzinski's sign and no Kernig's sign noted. No thyromegaly present.       No lymph nodes present. No mass palpable in the neck  Cardiovascular: Normal rate, regular rhythm, normal heart sounds and intact distal pulses.   Pulmonary/Chest: Effort normal and breath sounds normal. No stridor.  Abdominal: Soft. Bowel sounds are normal. She exhibits no distension. There is no rebound and no guarding.  Musculoskeletal: Normal range of motion.  Lymphadenopathy:    She has no cervical adenopathy.  Neurological: She is alert and oriented to person, place, and time. She has normal reflexes. She displays normal reflexes. No cranial nerve deficit. Coordination normal.       5/5 strength in BUE and LLE, 5-/5 RLE cw with her previous stroke.    Skin: Skin is warm and dry.  Psychiatric: She has a normal mood and affect. Her behavior is normal.    ED Course  Procedures (including critical care time)  DIAGNOSTIC STUDIES: Oxygen  Saturation is 100% on room air, normal by my interpretation.    COORDINATION OF CARE:    12:33AM- Treatment plan dicussed with patient. Pt agrees to treatment.   Labs Reviewed - No data to display No results found.   No diagnosis found.    MDM  Symptoms consistent with spasm.  Follow up with neurology and your family doctor for ongoing care return for weakness numbness changes in speech or any concerns.  Patient  and family verbalize understanding and agree to follow up      I personally performed the services described in this documentation, which was scribed in my presence. The recorded information has been reviewed and considered.     Jasmine Awe, MD 04/08/12 [phone]

## 2012-04-08 NOTE — Telephone Encounter (Signed)
Kim (nurse) from [facility] was calling to inform you that pt will be admitted on Sept 14... The referral came in from the hospital... Number [phone]

## 2012-04-09 ENCOUNTER — Ambulatory Visit (INDEPENDENT_AMBULATORY_CARE_PROVIDER_SITE_OTHER): Payer: BC Managed Care – PPO | Admitting: Internal Medicine

## 2012-04-09 ENCOUNTER — Other Ambulatory Visit: Payer: Self-pay | Admitting: Internal Medicine

## 2012-04-09 ENCOUNTER — Encounter: Payer: Self-pay | Admitting: Internal Medicine

## 2012-04-09 VITALS — BP 128/88 | HR 67 | Temp 97.1°F | Resp 18 | Wt 197.0 lb

## 2012-04-09 DIAGNOSIS — M5020 Other cervical disc displacement, unspecified cervical region: Secondary | ICD-10-CM

## 2012-04-09 MED ORDER — METHOCARBAMOL 500 MG PO TABS: 500.0000 mg | ORAL_TABLET | Freq: Two times a day (BID) | ORAL | Status: AC

## 2012-04-09 MED ORDER — HYDROCODONE-ACETAMINOPHEN 5-500 MG PO TABS: 1.0000 | ORAL_TABLET | Freq: Three times a day (TID) | ORAL | Status: AC | PRN

## 2012-04-09 NOTE — Patient Instructions (Signed)
Will refer to neurosurgery  Call if any worsening

## 2012-04-09 NOTE — Progress Notes (Signed)
Subjective:    Patient ID: Anita Benton, female    DOB: Dec 10, 1955, 56 y.o.   MRN: [id number]  HPI  Anita Benton is here for hospital follow up.  She was admitted to [facility] with headache, facial numbness and R sided weakness felt secondary to hemi[plegic migraine vs TIA.  MRI/MRA  Essentially unrevealing for CVA but cervical MRI revealed disc protrusion at C3-C5 level  With mild cord flattening.  She did not have a neurosugical  consult while in hospital.  She was discharged with home PT  And  81 mg ASA daily.  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  Was given doxycycline in the hospital  Pt reports she was seen in ER since hospital discharge for severe neck pain and "I felt a lump in my neck"  See CTA of cervical area.  She was given vicodin and Robaxin and she says this helps her.   Her home PT is due to start soon.   Neck pain improved with Vicodin.  Ambulates with a walker and has inversion of R foot when walking  Allergies  Allergen Reactions  . Codeine Rash  . Levofloxacin Itching and Swelling    Lip swelling  . Rice Shortness Of Breath and Rash   Past Medical History  Diagnosis Date  . Shingles 04/01/2012    "have them q year; usually in Nov; for the last 20 years"  . Asthma   . Hyperlipidemia   . Vitamin d deficiency   . Allergy   . Itching   . Keloid     "q where; I have keloid skin"  . Menopause   . Right leg DVT 1980's  . Shortness of breath 04/01/2012    "when my asthma acts up"  . Migraines 04/01/2012  . Stroke 1977; 04/01/2012    denies residual; "from BCP RX"; little weak right arm; right face little numb"  . Arthritis     "ankles; right hip"   Past Surgical History  Procedure Date  . Cesarean section 1974, 1977, 1979  . Abdominal hysterectomy 1980's  . Varicose vein surgery 1970's    bilaterally   History   Social  History  . Marital Status: Divorced    Spouse Name: N/A    Number of Children: N/A  . Years of Education: N/A   Occupational History  . Not on file.   Social History Main Topics  . Smoking status: Never Smoker   . Smokeless tobacco: Never Used  . Alcohol Use: No  . Drug Use: No  . Sexually Active: Yes    Birth Control/ Protection: Surgical   Other Topics Concern  . Not on file   Social History Narrative  . No narrative on file   Family History  Problem Relation Age of Onset  . Cancer Mother     lung   Patient Active Problem List  Diagnosis  . Arthritis  . Asthma in adult  . Hyperlipidemia  . Shingles  . Vitamin d deficiency  . Allergy  . Keloid  . Menopause  . DVT, lower extremity  . Varicose veins  . Urinary tract infection, site not specified  . Nevus  . Finger, open wounds, complicated  . CVA (cerebral infarction)  . HTN (hypertension)  . Finger pain, right  . Gait abnormality  . Headache, hemiplegic migraine  . Herniated disc, cervical   Current  Outpatient Prescriptions on File Prior to Visit  Medication Sig Dispense Refill  . albuterol (PROVENTIL HFA;VENTOLIN HFA) 108 (90 BASE) MCG/ACT inhaler Inhale 2 puffs into the lungs every 6 (six) hours as needed. For shortness of breath or wheezing      . aspirin 81 MG EC tablet Take 1 tablet (81 mg total) by mouth daily. Swallow whole.  30 tablet  12  . atorvastatin (LIPITOR) 10 MG tablet Take 10 mg by mouth daily.      . budesonide (RHINOCORT AQUA) 32 MCG/ACT nasal spray Place 1 spray into the nose daily.      . budesonide-formoterol (SYMBICORT) 160-4.5 MCG/ACT inhaler Inhale 2 puffs into the lungs 2 (two) times daily.      . diphenhydrAMINE (SOMINEX) 25 MG tablet Take 50 mg by mouth at bedtime as needed. For itching.      Marland Kitchen doxycycline (VIBRA-TABS) 100 MG tablet Take 1 tablet (100 mg total) by mouth every 12 (twelve) hours.  6 tablet  0  . escitalopram (LEXAPRO) 10 MG tablet Take 10 mg by mouth daily.      Marland Kitchen  gabapentin (NEURONTIN) 300 MG capsule Take 1-2 capsules (300-600 mg total) by mouth daily as needed (Migrane headache).  30 capsule  0  . hydrOXYzine (ATARAX/VISTARIL) 25 MG tablet Take 25 mg by mouth 2 (two) times daily as needed.        . montelukast (SINGULAIR) 10 MG tablet Take 10 mg by mouth at bedtime.         Review of Systems    see HPI Objective:   Physical Exam Physical Exam  Nursing note and vitals reviewed.  Constitutional: She is oriented to person, place, and time. She appears well-developed and well-nourished.  HENT:  Head: Normocephalic and atraumatic.  Cardiovascular: Normal rate and regular rhythm. Exam reveals no gallop and no friction rub.  No murmur heard.  Pulmonary/Chest: Breath sounds normal. She has no wheezes. She has no rales.  Neurological: LImited exam She is alert and oriented to person, place, and time. CNII-Xii intact Motor  4/5 R ue and Le she does have R foot inversion when walking  Skin: Skin is warm and dry.  Psychiatric: She has a normal mood and affect. Her behavior is normal.              Assessment & Plan:  Cervical disc protrusion:  With cord flattening on cervical MRI will get neurosugical opinion.  Referral to [facility]  Neck pain IN part duet to neck spasm  OK to continue Vicodin and Robaxin for now  R hemiplegia  Possibly due to hemiplegic migraine.  MRI/MrA neg for CVA.  Symptoms persisting too long for TIA.  Will eventually need neurology opinion as an outpt.  HTN  Well controlled    Recurrent finger cellulitis  I counseled pt to follow with worker's compensation MD and ask for an Infectious disease opinion.  She voices understanding.  I offered to refer but she states she would like for it to be paid for by worker's comp

## 2012-04-11 ENCOUNTER — Telehealth: Payer: Self-pay | Admitting: Internal Medicine

## 2012-04-11 NOTE — Telephone Encounter (Signed)
Pt lvm at 1225 pm on 09/12, she wanted to know the status of her FMLA.... Per Dr. Constance Goltz the papers will be faxed on Monday 9/16.... Pt was called at 155 pm via [phone] to inform her the update... Ad

## 2012-04-15 ENCOUNTER — Telehealth: Payer: Self-pay | Admitting: *Deleted

## 2012-04-17 NOTE — Telephone Encounter (Signed)
Family called to confirm pt upcoming appt to neorologist

## 2012-04-24 ENCOUNTER — Telehealth: Payer: Self-pay | Admitting: Internal Medicine

## 2012-04-24 NOTE — Telephone Encounter (Signed)
Pt wants to know if the doctor recd any results from the neuro doc about her MRI ( LOWER BACK)... Pt states both arms are hurting her and is wondering if she has fibromyalgia... Please call at [phone]; she called at [phone] on 04/24/2012

## 2012-04-29 ENCOUNTER — Other Ambulatory Visit: Payer: Self-pay | Admitting: *Deleted

## 2012-04-29 NOTE — Telephone Encounter (Signed)
Refill request from [facility]  Pharmacy

## 2012-04-30 ENCOUNTER — Emergency Department (HOSPITAL_BASED_OUTPATIENT_CLINIC_OR_DEPARTMENT_OTHER)
Admission: EM | Admit: 2012-04-30 | Discharge: 2012-04-30 | Disposition: A | Discharge status: Home / Self Care | Payer: BC Managed Care – PPO | Attending: Emergency Medicine | Admitting: Emergency Medicine

## 2012-04-30 ENCOUNTER — Encounter: Payer: Self-pay | Admitting: Internal Medicine

## 2012-04-30 ENCOUNTER — Encounter (HOSPITAL_BASED_OUTPATIENT_CLINIC_OR_DEPARTMENT_OTHER): Payer: Self-pay | Admitting: Family Medicine

## 2012-04-30 ENCOUNTER — Telehealth: Payer: Self-pay | Admitting: *Deleted

## 2012-04-30 ENCOUNTER — Ambulatory Visit (INDEPENDENT_AMBULATORY_CARE_PROVIDER_SITE_OTHER): Payer: BC Managed Care – PPO | Admitting: Internal Medicine

## 2012-04-30 VITALS — HR 115 | Resp 24

## 2012-04-30 DIAGNOSIS — M79606 Pain in leg, unspecified: Secondary | ICD-10-CM

## 2012-04-30 DIAGNOSIS — M1610 Unilateral primary osteoarthritis, unspecified hip: Secondary | ICD-10-CM | POA: Insufficient documentation

## 2012-04-30 DIAGNOSIS — M19079 Primary osteoarthritis, unspecified ankle and foot: Secondary | ICD-10-CM | POA: Insufficient documentation

## 2012-04-30 DIAGNOSIS — Z91018 Allergy to other foods: Secondary | ICD-10-CM | POA: Insufficient documentation

## 2012-04-30 DIAGNOSIS — M79609 Pain in unspecified limb: Secondary | ICD-10-CM

## 2012-04-30 DIAGNOSIS — Z7982 Long term (current) use of aspirin: Secondary | ICD-10-CM | POA: Insufficient documentation

## 2012-04-30 DIAGNOSIS — Z881 Allergy status to other antibiotic agents status: Secondary | ICD-10-CM | POA: Insufficient documentation

## 2012-04-30 DIAGNOSIS — Z86718 Personal history of other venous thrombosis and embolism: Secondary | ICD-10-CM | POA: Insufficient documentation

## 2012-04-30 DIAGNOSIS — Z885 Allergy status to narcotic agent status: Secondary | ICD-10-CM | POA: Insufficient documentation

## 2012-04-30 DIAGNOSIS — Z79899 Other long term (current) drug therapy: Secondary | ICD-10-CM | POA: Insufficient documentation

## 2012-04-30 DIAGNOSIS — M79604 Pain in right leg: Secondary | ICD-10-CM

## 2012-04-30 DIAGNOSIS — E785 Hyperlipidemia, unspecified: Secondary | ICD-10-CM | POA: Insufficient documentation

## 2012-04-30 DIAGNOSIS — M62838 Other muscle spasm: Secondary | ICD-10-CM | POA: Insufficient documentation

## 2012-04-30 DIAGNOSIS — Z8673 Personal history of transient ischemic attack (TIA), and cerebral infarction without residual deficits: Secondary | ICD-10-CM | POA: Insufficient documentation

## 2012-04-30 DIAGNOSIS — J45909 Unspecified asthma, uncomplicated: Secondary | ICD-10-CM | POA: Insufficient documentation

## 2012-04-30 LAB — CBC WITH DIFFERENTIAL/PLATELET
Basophils Absolute: 0 10*3/uL (ref 0.0–0.1)
Basophils Relative: 0 % (ref 0–1)
Eosinophils Absolute: 0.1 10*3/uL (ref 0.0–0.7)
MCH: 29.8 pg (ref 26.0–34.0)
MCHC: 34.9 g/dL (ref 30.0–36.0)
Neutro Abs: 4.2 10*3/uL (ref 1.7–7.7)
Neutrophils Relative %: 55 % (ref 43–77)
Platelets: 284 10*3/uL (ref 150–400)
RDW: 13.2 % (ref 11.5–15.5)

## 2012-04-30 LAB — BASIC METABOLIC PANEL
BUN: 14 mg/dL (ref 6–23)
Calcium: 9.4 mg/dL (ref 8.4–10.5)
GFR calc Af Amer: >90 mL/min (ref >90)
GFR calc non Af Amer: >90 mL/min (ref >90)
Glucose, Bld: 84 mg/dL (ref 70–99)

## 2012-04-30 MED ORDER — GABAPENTIN 300 MG PO CAPS: ORAL_CAPSULE | ORAL | Status: DC

## 2012-04-30 MED ORDER — HYDROMORPHONE HCL PF 1 MG/ML IJ SOLN
1.0000 mg | Freq: Once | INTRAMUSCULAR | Status: AC
  Administered 2012-04-30: 1 mg via INTRAVENOUS
  Filled 2012-04-30: qty 1

## 2012-04-30 MED ORDER — METHYLPREDNISOLONE SODIUM SUCC 125 MG IJ SOLR
125.0000 mg | Freq: Once | INTRAMUSCULAR | Status: AC
  Administered 2012-04-30: 125 mg via INTRAVENOUS
  Filled 2012-04-30: qty 2

## 2012-04-30 MED ORDER — DIAZEPAM 5 MG/ML IJ SOLN
5.0000 mg | Freq: Once | INTRAMUSCULAR | Status: AC
  Administered 2012-04-30: 5 mg via INTRAVENOUS
  Filled 2012-04-30: qty 2

## 2012-04-30 MED ORDER — METHOCARBAMOL 100 MG/ML IJ SOLN
1000.0000 mg | Freq: Once | INTRAMUSCULAR | Status: AC
  Administered 2012-04-30: 1000 mg via INTRAVENOUS
  Filled 2012-04-30: qty 10

## 2012-04-30 NOTE — Patient Instructions (Addendum)
Make follow up appointment with Dr. Terrace Arabia  [phone]

## 2012-04-30 NOTE — Telephone Encounter (Signed)
Left message for pts daughter regarding her appt with neurology awaiting return call

## 2012-04-30 NOTE — Progress Notes (Signed)
Subjective:    Patient ID: Anita Benton, female    DOB: Sep 23, 1955, 56 y.o.   MRN: [id number]  HPI  Anita Benton is here for follow up accompanied by daughter Anita Benton.  She is in wheelchair crying and stating she is having severe muscle spasm in R leg.  She is out of her neurontin which she says helps her somewhat  Ting did see a Dr. Terrace Arabia and she states she saw neurosurgeon.  See Dr Zannie Cove scanned note of 04/15/2012.  Etiology of R leg weakness and gait disturbance unclear.  Pt states she was told her EMG and NCV was "OK" She also had lumbar MRI but does not know results  Allergies  Allergen Reactions  . Codeine Rash  . Levofloxacin Itching and Swelling    Lip swelling  . Rice Shortness Of Breath and Rash   Past Medical History  Diagnosis Date  . Shingles 04/01/2012    "have them q year; usually in Nov; for the last 20 years"  . Asthma   . Hyperlipidemia   . Vitamin d deficiency   . Allergy   . Itching   . Keloid     "q where; I have keloid skin"  . Menopause   . Right leg DVT 1980's  . Shortness of breath 04/01/2012    "when my asthma acts up"  . Migraines 04/01/2012  . Stroke 1977; 04/01/2012    denies residual; "from BCP RX"; little weak right arm; right face little numb"  . Arthritis     "ankles; right hip"   Past Surgical History  Procedure Date  . Cesarean section 1974, 1977, 1979  . Abdominal hysterectomy 1980's  . Varicose vein surgery 1970's    bilaterally   History   Social History  . Marital Status: Divorced    Spouse Name: N/A    Number of Children: N/A  . Years of Education: N/A   Occupational History  . Not on file.   Social History Main Topics  . Smoking status: Never Smoker   . Smokeless tobacco: Never Used  . Alcohol Use: No  . Drug Use: No  . Sexually Active: Yes    Birth Control/ Protection: Surgical   Other Topics Concern  . Not on file   Social History Narrative  . No narrative on file   Family History  Problem Relation Age of Onset    . Cancer Mother     lung   Patient Active Problem List  Diagnosis  . Arthritis  . Asthma in adult  . Hyperlipidemia  . Shingles  . Vitamin d deficiency  . Allergy  . Keloid  . Menopause  . DVT, lower extremity  . Varicose veins  . Urinary tract infection, site not specified  . Nevus  . Finger, open wounds, complicated  . HTN (hypertension)  . Finger pain, right  . Gait abnormality  . Headache, hemiplegic migraine  . Herniated disc, cervical   Current Outpatient Prescriptions on File Prior to Visit  Medication Sig Dispense Refill  . albuterol (PROVENTIL HFA;VENTOLIN HFA) 108 (90 BASE) MCG/ACT inhaler Inhale 2 puffs into the lungs every 6 (six) hours as needed. For shortness of breath or wheezing      . aspirin 81 MG EC tablet Take 1 tablet (81 mg total) by mouth daily. Swallow whole.  30 tablet  12  . atorvastatin (LIPITOR) 10 MG tablet Take 10 mg by mouth daily.      . budesonide (RHINOCORT AQUA) 32 MCG/ACT nasal spray  Place 1 spray into the nose daily.      . budesonide-formoterol (SYMBICORT) 160-4.5 MCG/ACT inhaler Inhale 2 puffs into the lungs 2 (two) times daily.      . diphenhydrAMINE (SOMINEX) 25 MG tablet Take 50 mg by mouth at bedtime as needed. For itching.      . escitalopram (LEXAPRO) 10 MG tablet Take 10 mg by mouth daily.      . hydrOXYzine (ATARAX/VISTARIL) 25 MG tablet Take 25 mg by mouth 2 (two) times daily as needed.        . montelukast (SINGULAIR) 10 MG tablet Take 10 mg by mouth at bedtime.      . gabapentin (NEURONTIN) 300 MG capsule Take 1-2 capsules (300-600 mg total) by mouth daily as needed (Migrane headache).  30 capsule  0      Review of Systems See HPI    Objective:   Physical Exam Physical Exam  Nursing note and vitals reviewed.   Crying and moaning while sitting in wheelchair.   Constitutional: She is oriented to person, place, and time. She appears well-developed and well-nourished.  HENT:  Head: Normocephalic and atraumatic.   Cardiovascular: Normal rate and regular rhythm. Exam reveals no gallop and no friction rub.  No murmur heard.  Pulmonary/Chest: Breath sounds normal. She has no wheezes. She has no rales.  Neurological: She is alert and oriented to person, place, and time.   Skin: Skin is warm and dry.                Assessment & Plan:  R leg pain :  I feel she needs injectable med to control pain and possible muscle spasm.  Will increase gabapentin to 300 mg am and 600 mg pm.  Counseled of drowsiness.    I also spoke with Dr. Zannie Cove office and they will see pt tomorrow afternoon.  Pt sent to ER for further eval and control of pain  R leg weakness/ muscle spasm  Extensive work up unclear etiology at this point

## 2012-04-30 NOTE — ED Notes (Addendum)
Pt sent here from Dr. Constance Goltz office for muscle spasm to right leg that is not controlled by PO med. PO Gabapentin increased today per Dr. Constance Goltz. Pt c/o pain and "spasm" to top of right foot and RLE x 1 week. Pt is being followed by [facility].

## 2012-04-30 NOTE — ED Provider Notes (Signed)
History     CSN: [id number]  Arrival date & time 04/30/12  0900   First MD Initiated Contact with Patient 04/30/12 0919      Chief Complaint  Patient presents with  . Spasms    (Consider location/radiation/quality/duration/timing/severity/associated sxs/prior treatment) HPI Patient complaining of right leg spasms for one month, waxes and wanes with increasing frequency and severity.  Patient shakes when she has the pain.  Patient has seen neurosurgeon for leg pain and then neurologist and patient states had nerve test and mri done of lumbar spine two weeks ago and does not have results.  Patient follow with [place] neurologist,  Dr. Constance Goltz is pmd and saw today and sent down to ed.  Patient with numbness or tingling but denies weakness.  Patient takes muscle relaxant and hydrocodone with some relief.   Past Medical History  Diagnosis Date  . Shingles 04/01/2012    "have them q year; usually in Nov; for the last 20 years"  . Asthma   . Hyperlipidemia   . Vitamin d deficiency   . Allergy   . Itching   . Keloid     "q where; I have keloid skin"  . Menopause   . Right leg DVT 1980's  . Shortness of breath 04/01/2012    "when my asthma acts up"  . Migraines 04/01/2012  . Stroke 1977; 04/01/2012    denies residual; "from BCP RX"; little weak right arm; right face little numb"  . Arthritis     "ankles; right hip"    Past Surgical History  Procedure Date  . Cesarean section 1974, 1977, 1979  . Abdominal hysterectomy 1980's  . Varicose vein surgery 1970's    bilaterally    Family History  Problem Relation Age of Onset  . Cancer Mother     lung    History  Substance Use Topics  . Smoking status: Never Smoker   . Smokeless tobacco: Never Used  . Alcohol Use: No    OB History    Grav Para Term Preterm Abortions TAB SAB Ect Mult Living   4 3   1 1           Review of Systems  Constitutional: Negative for fever, chills, activity change, appetite change and unexpected  weight change.  HENT: Negative for sore throat, rhinorrhea, neck pain, neck stiffness and sinus pressure.   Eyes: Negative for visual disturbance.  Respiratory: Negative for cough and shortness of breath.   Cardiovascular: Negative for chest pain and leg swelling.  Gastrointestinal: Negative for vomiting, abdominal pain, diarrhea and blood in stool.  Genitourinary: Negative for dysuria, urgency, frequency, vaginal discharge and difficulty urinating.  Musculoskeletal: Negative for myalgias, arthralgias and gait problem.  Skin: Negative for color change and rash.  Neurological: Negative for weakness, light-headedness and headaches.  Hematological: Does not bruise/bleed easily.  Psychiatric/Behavioral: Negative for dysphoric mood.    Allergies  Codeine; Levofloxacin; and Rice  Home Medications   Current Outpatient Rx  Name Route Sig Dispense Refill  . ALBUTEROL SULFATE HFA 108 (90 BASE) MCG/ACT IN AERS Inhalation Inhale 2 puffs into the lungs every 6 (six) hours as needed. For shortness of breath or wheezing    . ASPIRIN 81 MG PO TBEC Oral Take 1 tablet (81 mg total) by mouth daily. Swallow whole. 30 tablet 12  . ATORVASTATIN CALCIUM 10 MG PO TABS Oral Take 10 mg by mouth daily.    . BUDESONIDE 32 MCG/ACT NA SUSP Nasal Place 1 spray into the  nose daily.    . BUDESONIDE-FORMOTEROL FUMARATE 160-4.5 MCG/ACT IN AERO Inhalation Inhale 2 puffs into the lungs 2 (two) times daily.    Marland Kitchen DIPHENHYDRAMINE HCL (SLEEP) 25 MG PO TABS Oral Take 50 mg by mouth at bedtime as needed. For itching.    Marland Kitchen ESCITALOPRAM OXALATE 10 MG PO TABS Oral Take 10 mg by mouth daily.    Marland Kitchen GABAPENTIN 300 MG PO CAPS  Take one tablet in the morning and two tablets at night 90 capsule 3  . HYDROXYZINE HCL 25 MG PO TABS Oral Take 25 mg by mouth 2 (two) times daily as needed.      Marland Kitchen MONTELUKAST SODIUM 10 MG PO TABS Oral Take 10 mg by mouth at bedtime.      BP 118/98  Pulse 74  Temp 98 F (36.7 C) (Oral)  Resp 20  SpO2  100%  Physical Exam  Nursing note and vitals reviewed. Constitutional: She is oriented to person, place, and time. She appears well-developed and well-nourished.  HENT:  Head: Normocephalic and atraumatic.  Eyes: Conjunctivae normal and EOM are normal. Pupils are equal, round, and reactive to light.  Neck: Normal range of motion. Neck supple.  Cardiovascular: Normal rate and regular rhythm.   Pulmonary/Chest: Effort normal and breath sounds normal.  Abdominal: Soft. Bowel sounds are normal.  Musculoskeletal: Normal range of motion. She exhibits no edema and no tenderness.  Neurological: She is alert and oriented to person, place, and time. She has normal strength and normal reflexes. No sensory deficit. She displays a negative Romberg sign. GCS eye subscore is 4. GCS verbal subscore is 5. GCS motor subscore is 6.    ED Course  Procedures (including critical care time)  Labs Reviewed - No data to display No results found.   No diagnosis found.    MDM  Patient's lab reviewed and no evidence of acute electrolyte abnormality. She feels greatly improved after medication here. Her daughter is making an appointment for her followup with her neurologist. She appears to have muscle spasms in the right lower extremity but has no evidence here on exam of acute neurological abnormality.       Hilario Quarry, MD 04/30/12 [phone]

## 2012-05-01 ENCOUNTER — Telehealth: Payer: Self-pay | Admitting: Internal Medicine

## 2012-05-01 NOTE — Telephone Encounter (Signed)
Ellamae's Daughter called stating concerns about her sister and mom's visit with Dr. Terrace Arabia (nuero doc) today.. The doctor basically states she needs to see a psych doctor... They are not very happy with Dr. Zannie Cove visit today.Delon Sacramento (pt's daughter who is a Charity fundraiser) went with pt to the doctor off and would like to speak with Dr. Reece Levy... Deanna's number is [phone].Marland KitchenMarland Kitchen

## 2012-05-01 NOTE — Telephone Encounter (Signed)
I left message on voicemail an number below regarding pts. Mother.  See scanned note from Dr. Terrace Arabia.   EMG done in office today negative and lumbar MRI shows mild DJD

## 2012-05-02 ENCOUNTER — Telehealth: Payer: Self-pay | Admitting: *Deleted

## 2012-05-02 ENCOUNTER — Telehealth: Payer: Self-pay | Admitting: Internal Medicine

## 2012-05-02 NOTE — Telephone Encounter (Signed)
Called pt and her daughter regarding appt with [facility] on 06/17/12

## 2012-05-02 NOTE — Telephone Encounter (Signed)
Spoke with daughter Jennette Kettle.  Pt and family would like a second opinion for pts R leg spasms.  Will refer to [facility]  See scanned office note Dr. Terrace Arabia.  EMG of R leg done during office visit yesterday unrevealing for cause of leg spasms.  I spoke with Dr. Terrace Arabia and she reports lumber MRI shows DJD.  Dr. Terrace Arabia referred pt to psychiatry and/or tertiary care neurology

## 2012-05-02 NOTE — Telephone Encounter (Signed)
Verbal consent given and verified by Margaretmary Dys RN to share healthcare information with daughter Jennette Kettle

## 2012-05-06 ENCOUNTER — Other Ambulatory Visit: Payer: Self-pay | Admitting: Internal Medicine

## 2012-05-06 MED ORDER — ATORVASTATIN CALCIUM 10 MG PO TABS: 10.0000 mg | ORAL_TABLET | Freq: Every day | ORAL | Status: DC

## 2012-05-06 MED ORDER — ESCITALOPRAM OXALATE 10 MG PO TABS: 10.0000 mg | ORAL_TABLET | Freq: Every day | ORAL | Status: DC

## 2012-05-06 MED ORDER — MONTELUKAST SODIUM 10 MG PO TABS: 10.0000 mg | ORAL_TABLET | Freq: Every day | ORAL | Status: DC

## 2012-05-06 NOTE — Telephone Encounter (Signed)
Needs refills

## 2012-05-06 NOTE — Telephone Encounter (Signed)
Pt will keep appt with neurologist as she is still having muscle spasms. pts pharmacy updated

## 2012-05-06 NOTE — Telephone Encounter (Signed)
Pt needs meds call into [facility] to get a refill in... Sinuglar 10 mg... Lipitor 10 mg.. Lexapro 10 mg... She states she was given a refer to Neurologist in [facility] and she feel she needs to go to an orthopeadatic to see about the right Hip Pain that she is experiencing... She feels as though that would better than seeing a neuro doc... Please call pt at [phone]

## 2012-05-06 NOTE — Telephone Encounter (Signed)
Anita Benton  Call pt and let her know that if she is stilll having tremors and jerking  in her R leg she needs the neurologist.   Orothopedic MD's do not handle leg tremors and jerking.    Which [facility] does she use??

## 2012-05-14 ENCOUNTER — Ambulatory Visit: Payer: Worker's Compensation | Admitting: Internal Medicine

## 2012-05-17 ENCOUNTER — Encounter: Payer: Self-pay | Admitting: *Deleted

## 2012-05-23 ENCOUNTER — Encounter: Payer: Self-pay | Admitting: Internal Medicine

## 2012-05-23 ENCOUNTER — Other Ambulatory Visit: Payer: Self-pay | Admitting: *Deleted

## 2012-05-23 ENCOUNTER — Ambulatory Visit (INDEPENDENT_AMBULATORY_CARE_PROVIDER_SITE_OTHER): Payer: BC Managed Care – PPO | Admitting: Internal Medicine

## 2012-05-23 VITALS — BP 133/81 | HR 76 | Temp 97.9°F | Resp 20 | Wt 207.0 lb

## 2012-05-23 DIAGNOSIS — N644 Mastodynia: Secondary | ICD-10-CM

## 2012-05-23 DIAGNOSIS — Z23 Encounter for immunization: Secondary | ICD-10-CM

## 2012-05-23 DIAGNOSIS — M25559 Pain in unspecified hip: Secondary | ICD-10-CM

## 2012-05-23 DIAGNOSIS — E785 Hyperlipidemia, unspecified: Secondary | ICD-10-CM

## 2012-05-23 DIAGNOSIS — M25551 Pain in right hip: Secondary | ICD-10-CM | POA: Insufficient documentation

## 2012-05-23 LAB — COMPREHENSIVE METABOLIC PANEL
ALT: 8 U/L (ref 0–35)
AST: 13 U/L (ref 0–37)
Albumin: 3.5 g/dL (ref 3.5–5.2)
Alkaline Phosphatase: 73 U/L (ref 39–117)
BUN: 13 mg/dL (ref 6–23)
Potassium: 3.8 mEq/L (ref 3.5–5.3)
Sodium: 141 mEq/L (ref 135–145)
Total Protein: 6.7 g/dL (ref 6.0–8.3)

## 2012-05-23 LAB — LIPID PANEL
HDL: 43 mg/dL (ref >39)
LDL Cholesterol: 115 mg/dL — ABNORMAL HIGH (ref 0–99)

## 2012-05-23 MED ORDER — TRAMADOL-ACETAMINOPHEN 37.5-325 MG PO TABS: ORAL_TABLET | ORAL | Status: DC

## 2012-05-23 NOTE — Progress Notes (Signed)
Subjective:    Patient ID: Anita Benton, female    DOB: 1956/04/19, 56 y.o.   MRN: [id number]  HPI Anita Benton is here for follow up  She reports that leg muscle spasms have resolved but she still had discomfort in her R hip.   Prior xray revealed DJD and bone spurs.  OTC Nsaids not helping  She also has been having bilateral breast pain over the last few days.  No rash or nipple discharge.  She has a mm appt due in mid November   Allergies  Allergen Reactions  . Codeine Rash  . Levofloxacin Itching and Swelling    Lip swelling  . Rice Shortness Of Breath and Rash   Past Medical History  Diagnosis Date  . Shingles 04/01/2012    "have them q year; usually in Nov; for the last 20 years"  . Asthma   . Hyperlipidemia   . Vitamin D deficiency   . Allergy   . Itching   . Keloid     "q where; I have keloid skin"  . Menopause   . Right leg DVT 1980's  . Shortness of breath 04/01/2012    "when my asthma acts up"  . Migraines 04/01/2012  . Stroke 1977; 04/01/2012    denies residual; "from BCP RX"; little weak right arm; right face little numb"  . Arthritis     "ankles; right hip"   Past Surgical History  Procedure Date  . Cesarean section 1974, 1977, 1979  . Abdominal hysterectomy 1980's  . Varicose vein surgery 1970's    bilaterally   History   Social History  . Marital Status: Divorced    Spouse Name: N/A    Number of Children: N/A  . Years of Education: N/A   Occupational History  . Not on file.   Social History Main Topics  . Smoking status: Never Smoker   . Smokeless tobacco: Never Used  . Alcohol Use: No  . Drug Use: No  . Sexually Active: Yes    Birth Control/ Protection: Surgical   Other Topics Concern  . Not on file   Social History Narrative  . No narrative on file   Family History  Problem Relation Age of Onset  . Cancer Mother     lung   Patient Active Problem List  Diagnosis  . Arthritis  . Asthma in adult  . Hyperlipidemia  . Shingles    . Vitamin d deficiency  . Allergy  . Keloid  . Menopause  . DVT, lower extremity  . Varicose veins  . Urinary tract infection, site not specified  . Nevus  . Finger, open wounds, complicated  . HTN (hypertension)  . Finger pain, right  . Gait abnormality  . Headache, hemiplegic migraine  . Herniated disc, cervical   Current Outpatient Prescriptions on File Prior to Visit  Medication Sig Dispense Refill  . albuterol (PROVENTIL HFA;VENTOLIN HFA) 108 (90 BASE) MCG/ACT inhaler Inhale 2 puffs into the lungs every 6 (six) hours as needed. For shortness of breath or wheezing      . aspirin 81 MG EC tablet Take 1 tablet (81 mg total) by mouth daily. Swallow whole.  30 tablet  12  . atorvastatin (LIPITOR) 10 MG tablet Take 1 tablet (10 mg total) by mouth daily.  30 tablet  5  . budesonide (RHINOCORT AQUA) 32 MCG/ACT nasal spray Place 1 spray into the nose daily.      . budesonide-formoterol (SYMBICORT) 160-4.5 MCG/ACT inhaler Inhale 2 puffs  into the lungs 2 (two) times daily.      Marland Kitchen escitalopram (LEXAPRO) 10 MG tablet Take 1 tablet (10 mg total) by mouth daily.  30 tablet  5  . gabapentin (NEURONTIN) 300 MG capsule Take one tablet in the morning and two tablets at night  90 capsule  3  . montelukast (SINGULAIR) 10 MG tablet Take 1 tablet (10 mg total) by mouth at bedtime.  30 tablet  5  . diphenhydrAMINE (SOMINEX) 25 MG tablet Take 50 mg by mouth at bedtime as needed. For itching.      . hydrOXYzine (ATARAX/VISTARIL) 25 MG tablet Take 25 mg by mouth 2 (two) times daily as needed.             Review of Systems    see HPI Objective:   Physical Exam Physical Exam  Nursing note and vitals reviewed.  Constitutional: She is oriented to person, place, and time. She appears well-developed and well-nourished.  HENT:  Head: Normocephalic and atraumatic.  Cardiovascular: Normal rate and regular rhythm. Exam reveals no gallop and no friction rub.  No murmur heard.  Pulmonary/Chest: Breath  sounds normal. She has no wheezes. She has no rales.  Breast  I see no rash or erythema bilaterally.  There is no nipple discharge no axillary adenopathy no discrete mass bilaterally.   Neurological: She is alert and oriented to person, place, and time.  Skin: Skin is warm and dry.  Psychiatric: She has a normal mood and affect. Her behavior is normal.  R hip  Pt complains of pain with minimal ROM       Assessment & Plan:  Hip pain:  Will give short course of ultracet  If no relief  Will refer for evaluation for possible joint injection  Bilateral breast pain:  Careful exam  Reveals no discrete mass, no dermatologic changes,  I feel it is OK to wait until next month when her screening mm is due.  Pt counseled to schedule this appt .   She voices  understanding  Hyperlipidemia  Will check labs liver today

## 2012-05-23 NOTE — Patient Instructions (Addendum)
Eye doctors Dr. Joana Reamer  [facility]  [phone]  Or Dr. Severiano Gilbert  Have mammogram report sent top ,me

## 2012-05-24 LAB — TSH: TSH: 2.179 u[IU]/mL (ref 0.350–4.500)

## 2012-05-24 MED ORDER — ATORVASTATIN CALCIUM 10 MG PO TABS: 10.0000 mg | ORAL_TABLET | Freq: Every day | ORAL | Status: DC

## 2012-05-27 ENCOUNTER — Encounter: Payer: Self-pay | Admitting: *Deleted

## 2012-05-27 ENCOUNTER — Telehealth: Payer: Self-pay | Admitting: *Deleted

## 2012-05-27 NOTE — Telephone Encounter (Signed)
Copy of labs sent to pt home address

## 2012-05-27 NOTE — Telephone Encounter (Signed)
Message copied by Mathews Robinsons on Mon May 27, 2012  4:19 PM ------      Message from: Raechel Chute D      Created: Fri May 24, 2012 10:16 AM       Ok to mail labs to pt

## 2012-05-30 ENCOUNTER — Other Ambulatory Visit: Payer: Self-pay | Admitting: Internal Medicine

## 2012-05-30 MED ORDER — ESCITALOPRAM OXALATE 10 MG PO TABS: 10.0000 mg | ORAL_TABLET | Freq: Every day | ORAL | Status: DC

## 2012-05-30 MED ORDER — MONTELUKAST SODIUM 10 MG PO TABS: 10.0000 mg | ORAL_TABLET | Freq: Every day | ORAL | Status: DC

## 2012-05-30 NOTE — Telephone Encounter (Signed)
Anita Benton  Let Rene Kocher know that if gabapentin not helping her she can stop it

## 2012-05-30 NOTE — Telephone Encounter (Signed)
See Kims note

## 2012-05-30 NOTE — Telephone Encounter (Signed)
Needs refill for Montelukast 10 mg & Escitalopran 10 mg for 3 month supply.  [facility] phone number [phone] for call in pharmacy.  Pt's phone number [phone].  Does she need to continue taking Gabatentin 300 mg? If so needs to also to be called into pharmacy.

## 2012-06-03 ENCOUNTER — Other Ambulatory Visit: Payer: Self-pay | Admitting: *Deleted

## 2012-06-13 ENCOUNTER — Encounter: Payer: BC Managed Care – PPO | Admitting: Internal Medicine

## 2012-06-13 ENCOUNTER — Telehealth: Payer: Self-pay | Admitting: *Deleted

## 2012-06-13 ENCOUNTER — Encounter: Payer: Self-pay | Admitting: Internal Medicine

## 2012-06-13 NOTE — Telephone Encounter (Signed)
Explained to pt that Dr Constance Goltz was unable to sign disibility or FMLA papers

## 2012-06-13 NOTE — Progress Notes (Signed)
  Subjective:    Patient ID: Anita Benton, female    DOB: [birth year], 56 y.o.   MRN: [id number]  HPI error  Review of Systems     Objective:   Physical Exam        Assessment & Plan:

## 2012-06-18 ENCOUNTER — Telehealth: Payer: Self-pay | Admitting: *Deleted

## 2012-06-18 NOTE — Telephone Encounter (Signed)
Tresa Endo from [employer] will send another FMLA form to office

## 2012-07-02 ENCOUNTER — Ambulatory Visit (INDEPENDENT_AMBULATORY_CARE_PROVIDER_SITE_OTHER): Payer: BC Managed Care – PPO | Admitting: Internal Medicine

## 2012-07-02 ENCOUNTER — Encounter: Payer: Self-pay | Admitting: Internal Medicine

## 2012-07-02 VITALS — BP 127/86 | HR 81 | Temp 97.8°F | Resp 20 | Wt 203.0 lb

## 2012-07-02 DIAGNOSIS — B029 Zoster without complications: Secondary | ICD-10-CM

## 2012-07-02 MED ORDER — VALACYCLOVIR HCL 1 G PO TABS: 1000.0000 mg | ORAL_TABLET | Freq: Two times a day (BID) | ORAL | Status: DC

## 2012-07-02 MED ORDER — HYDROCODONE-ACETAMINOPHEN 5-500 MG PO TABS: 1.0000 | ORAL_TABLET | Freq: Four times a day (QID) | ORAL | Status: DC | PRN

## 2012-07-02 NOTE — Progress Notes (Signed)
Subjective:    Patient ID: Anita Benton, female    DOB: 08-04-1955, 56 y.o.   MRN: [id number]  HPI [name] is here for acute visit.  She has a history of facial shingles and starte with itchy lesion on scalp near parietal area.  Some numbness prior to rash.  She is having significant discomfort  Allergies  Allergen Reactions  . Codeine Rash  . Levofloxacin Itching and Swelling    Lip swelling  . Rice Shortness Of Breath and Rash   Past Medical History  Diagnosis Date  . Shingles 04/01/2012    "have them q year; usually in Nov; for the last 20 years"  . Asthma   . Hyperlipidemia   . Vitamin D deficiency   . Allergy   . Itching   . Keloid     "q where; I have keloid skin"  . Menopause   . Right leg DVT 1980's  . Shortness of breath 04/01/2012    "when my asthma acts up"  . Migraines 04/01/2012  . Stroke 1977; 04/01/2012    denies residual; "from BCP RX"; little weak right arm; right face little numb"  . Arthritis     "ankles; right hip"   Past Surgical History  Procedure Date  . Cesarean section 1974, 1977, 1979  . Abdominal hysterectomy 1980's  . Varicose vein surgery 1970's    bilaterally   History   Social History  . Marital Status: Divorced    Spouse Name: N/A    Number of Children: N/A  . Years of Education: N/A   Occupational History  . Not on file.   Social History Main Topics  . Smoking status: Never Smoker   . Smokeless tobacco: Never Used  . Alcohol Use: No  . Drug Use: No  . Sexually Active: Yes    Birth Control/ Protection: Surgical   Other Topics Concern  . Not on file   Social History Narrative  . No narrative on file   Family History  Problem Relation Age of Onset  . Cancer Mother     lung   Patient Active Problem List  Diagnosis  . Arthritis  . Asthma in adult  . Hyperlipidemia  . Shingles  . Vitamin d deficiency  . Allergy  . Keloid  . Menopause  . DVT, lower extremity  . Varicose veins  . Urinary tract infection, site  not specified  . Nevus  . Finger, open wounds, complicated  . HTN (hypertension)  . Finger pain, right  . Gait abnormality  . Headache, hemiplegic migraine  . Herniated disc, cervical  . Right hip pain   Current Outpatient Prescriptions on File Prior to Visit  Medication Sig Dispense Refill  . albuterol (PROVENTIL HFA;VENTOLIN HFA) 108 (90 BASE) MCG/ACT inhaler Inhale 2 puffs into the lungs every 6 (six) hours as needed. For shortness of breath or wheezing      . aspirin 81 MG EC tablet Take 1 tablet (81 mg total) by mouth daily. Swallow whole.  30 tablet  12  . atorvastatin (LIPITOR) 10 MG tablet Take 1 tablet (10 mg total) by mouth daily.  90 tablet  1  . budesonide (RHINOCORT AQUA) 32 MCG/ACT nasal spray Place 1 spray into the nose daily.      . budesonide-formoterol (SYMBICORT) 160-4.5 MCG/ACT inhaler Inhale 2 puffs into the lungs 2 (two) times daily.      . diphenhydrAMINE (SOMINEX) 25 MG tablet Take 50 mg by mouth at bedtime as needed. For itching.      Marland Kitchen  escitalopram (LEXAPRO) 10 MG tablet Take 1 tablet (10 mg total) by mouth daily.  30 tablet  5  . montelukast (SINGULAIR) 10 MG tablet Take 1 tablet (10 mg total) by mouth at bedtime.  30 tablet  5  . traMADol-acetaminophen (ULTRACET) 37.5-325 MG per tablet Take one tablet every 8 hours as needed for pain  30 tablet  1      Review of Systems See HPI    Objective:   Physical Exam Physical Exam  Nursing note and vitals reviewed.  Constitutional: She is oriented to person, place, and time. She appears well-developed and well-nourished.  HENT:  Head: Normocephalic and atraumatic.  Cardiovascular: Normal rate and regular rhythm. Exam reveals no gallop and no friction rub.  No murmur heard.  Pulmonary/Chest: Breath sounds normal. She has no wheezes. She has no rales.  Neurological: She is alert and oriented to person, place, and time.  Skin: Skin is warm and dry.  She has reddened area L side of parietal scalp with a one  blister visible Psychiatric: She has a normal mood and affect. Her behavior is normal.              Assessment & Plan:  Probable herpes zoster  Will give  Valtrex 1 gm  Bid for 7 days  Pain  Secondary to above.  Pt states she has had vicodin in thepast.  Ok for short course   Advised if any involvement of skin close to eye , she will need opthalmology referral.  She is to see me Monday or sooner if worsening rash.  She voices understanding

## 2012-07-02 NOTE — Patient Instructions (Addendum)
See me Monday

## 2012-07-08 ENCOUNTER — Ambulatory Visit: Payer: BC Managed Care – PPO | Admitting: Internal Medicine

## 2012-08-05 ENCOUNTER — Encounter: Payer: Self-pay | Admitting: Internal Medicine

## 2012-08-05 ENCOUNTER — Ambulatory Visit (INDEPENDENT_AMBULATORY_CARE_PROVIDER_SITE_OTHER): Payer: BC Managed Care – PPO | Admitting: Internal Medicine

## 2012-08-05 VITALS — BP 112/80 | HR 76 | Temp 97.8°F | Resp 20 | Wt 203.0 lb

## 2012-08-05 DIAGNOSIS — R05 Cough: Secondary | ICD-10-CM

## 2012-08-05 DIAGNOSIS — J069 Acute upper respiratory infection, unspecified: Secondary | ICD-10-CM

## 2012-08-05 DIAGNOSIS — M25552 Pain in left hip: Secondary | ICD-10-CM

## 2012-08-05 DIAGNOSIS — R059 Cough, unspecified: Secondary | ICD-10-CM

## 2012-08-05 DIAGNOSIS — M25559 Pain in unspecified hip: Secondary | ICD-10-CM

## 2012-08-05 MED ORDER — AZITHROMYCIN 250 MG PO TABS: ORAL_TABLET | ORAL | Status: DC

## 2012-08-05 MED ORDER — HYDROCODONE-ACETAMINOPHEN 5-500 MG PO TABS: 1.0000 | ORAL_TABLET | Freq: Four times a day (QID) | ORAL | Status: DC | PRN

## 2012-08-05 NOTE — Progress Notes (Signed)
Subjective:    Patient ID: Anita Benton, female    DOB: 1956/02/10, 57 y.o.   MRN: [id number]  HPI Anita Benton is here for acute visit.  5 days of upper resp congestion with cough green mucous.  No fever no chest pain no sob  Also continues with hip pain .  She has upcoming appt with Dr. Pearletha Forge  Allergies  Allergen Reactions  . Codeine Rash  . Levofloxacin Itching and Swelling    Lip swelling  . Rice Shortness Of Breath and Rash   Past Medical History  Diagnosis Date  . Shingles 04/01/2012    "have them q year; usually in Nov; for the last 20 years"  . Asthma   . Hyperlipidemia   . Vitamin D deficiency   . Allergy   . Itching   . Keloid     "q where; I have keloid skin"  . Menopause   . Right leg DVT 1980's  . Shortness of breath 04/01/2012    "when my asthma acts up"  . Migraines 04/01/2012  . Stroke 1977; 04/01/2012    denies residual; "from BCP RX"; little weak right arm; right face little numb"  . Arthritis     "ankles; right hip"   Past Surgical History  Procedure Date  . Cesarean section 1974, 1977, 1979  . Abdominal hysterectomy 1980's  . Varicose vein surgery 1970's    bilaterally   History   Social History  . Marital Status: Divorced    Spouse Name: N/A    Number of Children: N/A  . Years of Education: N/A   Occupational History  . Not on file.   Social History Main Topics  . Smoking status: Never Smoker   . Smokeless tobacco: Never Used  . Alcohol Use: No  . Drug Use: No  . Sexually Active: Yes    Birth Control/ Protection: Surgical   Other Topics Concern  . Not on file   Social History Narrative  . No narrative on file   Family History  Problem Relation Age of Onset  . Cancer Mother     lung   Patient Active Problem List  Diagnosis  . Arthritis  . Asthma in adult  . Hyperlipidemia  . Shingles  . Vitamin d deficiency  . Allergy  . Keloid  . Menopause  . DVT, lower extremity  . Varicose veins  . Urinary tract infection, site not  specified  . Nevus  . Finger, open wounds, complicated  . HTN (hypertension)  . Finger pain, right  . Gait abnormality  . Headache, hemiplegic migraine  . Herniated disc, cervical  . Right hip pain  . Zoster   Current Outpatient Prescriptions on File Prior to Visit  Medication Sig Dispense Refill  . albuterol (PROVENTIL HFA;VENTOLIN HFA) 108 (90 BASE) MCG/ACT inhaler Inhale 2 puffs into the lungs every 6 (six) hours as needed. For shortness of breath or wheezing      . aspirin 81 MG EC tablet Take 1 tablet (81 mg total) by mouth daily. Swallow whole.  30 tablet  12  . atorvastatin (LIPITOR) 10 MG tablet Take 1 tablet (10 mg total) by mouth daily.  90 tablet  1  . budesonide (RHINOCORT AQUA) 32 MCG/ACT nasal spray Place 1 spray into the nose daily.      . budesonide-formoterol (SYMBICORT) 160-4.5 MCG/ACT inhaler Inhale 2 puffs into the lungs 2 (two) times daily.      . diphenhydrAMINE (SOMINEX) 25 MG tablet Take 50 mg by  mouth at bedtime as needed. For itching.      . escitalopram (LEXAPRO) 10 MG tablet Take 1 tablet (10 mg total) by mouth daily.  30 tablet  5  . montelukast (SINGULAIR) 10 MG tablet Take 1 tablet (10 mg total) by mouth at bedtime.  30 tablet  5  . valACYclovir (VALTREX) 1000 MG tablet Take 1 tablet (1,000 mg total) by mouth 2 (two) times daily.  14 tablet  1  . HYDROcodone-acetaminophen (VICODIN) 5-500 MG per tablet Take 1 tablet by mouth every 6 (six) hours as needed for pain.  30 tablet  0       Review of Systems    see HPI Objective:   Physical Exam Physical Exam  Constitutional: She is oriented to person, place, and time. She appears well-developed and well-nourished. She is cooperative.  HENT:  Head: Normocephalic and atraumatic.  Right Ear: A middle ear effusion is present.  Left Ear: A middle ear effusion is present.  Nose: Mucosal edema present.  Mouth/Throat: Oropharyngeal exudate and posterior oropharyngeal erythema present.  Serous effusion  bilaterally  Eyes: Conjunctivae and EOM are normal. Pupils are equal, round, and reactive to light.  Neck: Neck supple. Carotid bruit is not present. No mass present.  Cardiovascular: Regular rhythm, normal heart sounds, intact distal pulses and normal pulses. Exam reveals no gallop and no friction rub.  No murmur heard.  Pulmonary/Chest: Breath sounds normal. She has no wheezes. She has no rhonchi. She has no rales.  Lymphadenopathy:  She has cervical adenopathy.  Neurological: She is alert and oriented to person, place, and time.  Skin: Skin is warm and dry. No abrasion, no bruising, no ecchymosis and no rash noted. No cyanosis. Nails show no clubbing.  Psychiatric: She has a normal mood and affect. Her speech is normal and behavior is normal.               Assessment & Plan:  URI:  Will give z-pak  Delsym for cough  Call if not better  Hip pain  Has upcoming appt with sports medicine. Ok for 12 vicodin only  Cough see above

## 2012-08-05 NOTE — Patient Instructions (Addendum)
See me as needed 

## 2012-08-07 ENCOUNTER — Ambulatory Visit: Payer: BC Managed Care – PPO | Admitting: Family Medicine

## 2012-08-09 ENCOUNTER — Ambulatory Visit (INDEPENDENT_AMBULATORY_CARE_PROVIDER_SITE_OTHER): Payer: BC Managed Care – PPO | Admitting: Family Medicine

## 2012-08-09 ENCOUNTER — Encounter: Payer: Self-pay | Admitting: Family Medicine

## 2012-08-09 VITALS — BP 127/87 | HR 88 | Ht 64.0 in | Wt 206.0 lb

## 2012-08-09 DIAGNOSIS — M79605 Pain in left leg: Secondary | ICD-10-CM

## 2012-08-09 DIAGNOSIS — M545 Low back pain, unspecified: Secondary | ICD-10-CM

## 2012-08-09 MED ORDER — CYCLOBENZAPRINE HCL 10 MG PO TABS: 10.0000 mg | ORAL_TABLET | Freq: Three times a day (TID) | ORAL | Status: DC | PRN

## 2012-08-09 MED ORDER — HYDROCODONE-ACETAMINOPHEN 5-325 MG PO TABS: 1.0000 | ORAL_TABLET | Freq: Four times a day (QID) | ORAL | Status: DC | PRN

## 2012-08-09 MED ORDER — PREDNISONE (PAK) 10 MG PO TABS: ORAL_TABLET | ORAL | Status: DC

## 2012-08-09 NOTE — Patient Instructions (Addendum)
You have lumbar radiculopathy (a pinched nerve in your low back). Start prednisone dose pack. Day AFTER finishing prednisone it's ok to take aleve 2 tabs twice a day with food for pain and inflammation. Norco as needed for severe pain (no driving on this medicine). Flexeril as needed for muscle spasms (no driving on this medicine if it makes you sleepy). Stay as active as possible. Physical therapy has been shown to be helpful as well - start this as soon as possible. Strengthening of low back muscles, abdominal musculature are key for long term pain relief. If not improving, will consider further imaging (MRI). Follow up with me in 1 month.

## 2012-08-09 NOTE — Assessment & Plan Note (Signed)
history suggestive of lumbar radiculopathy but no red flags and exam otherwise benign.  Start with PT, prednisone with norco and flexeril as needed for severe pain.  F/u in 1 month.  Consider MRI lumbar spine if not improving as expected.

## 2012-08-09 NOTE — Progress Notes (Signed)
Subjective:    Patient ID: Anita Benton, female    DOB: [birth year], 57 y.o.   MRN: [id number]  PCP: Dr. Constance Goltz  HPI 57 yo F here for low back pain.  Patient reports she's had about 5 months of low back pain. Previously had symptoms going into her right leg and right-sided TIA. For about 2 months left sided low back pain has come on and worsened along with radiation now into left leg. Able to work through this but becomes fairly severe by 2pm. Takes vicodin, aleve as needed for pain. No numbness or tingling. No bowel/bladder dysfunction.  Past Medical History  Diagnosis Date  . Shingles 04/01/2012    "have them q year; usually in Nov; for the last 20 years"  . Asthma   . Hyperlipidemia   . Vitamin D deficiency   . Allergy   . Itching   . Keloid     "q where; I have keloid skin"  . Menopause   . Right leg DVT 1980's  . Shortness of breath 04/01/2012    "when my asthma acts up"  . Migraines 04/01/2012  . Stroke 1977; 04/01/2012    denies residual; "from BCP RX"; little weak right arm; right face little numb"  . Arthritis     "ankles; right hip"    Current Outpatient Prescriptions on File Prior to Visit  Medication Sig Dispense Refill  . albuterol (PROVENTIL HFA;VENTOLIN HFA) 108 (90 BASE) MCG/ACT inhaler Inhale 2 puffs into the lungs every 6 (six) hours as needed. For shortness of breath or wheezing      . atorvastatin (LIPITOR) 10 MG tablet Take 1 tablet (10 mg total) by mouth daily.  90 tablet  1  . budesonide-formoterol (SYMBICORT) 160-4.5 MCG/ACT inhaler Inhale 2 puffs into the lungs 2 (two) times daily.      Marland Kitchen escitalopram (LEXAPRO) 10 MG tablet Take 1 tablet (10 mg total) by mouth daily.  30 tablet  5  . montelukast (SINGULAIR) 10 MG tablet Take 1 tablet (10 mg total) by mouth at bedtime.  30 tablet  5  . aspirin 81 MG EC tablet Take 1 tablet (81 mg total) by mouth daily. Swallow whole.  30 tablet  12  . azithromycin (ZITHROMAX) 250 MG tablet Take as directed  6  tablet  0  . budesonide (RHINOCORT AQUA) 32 MCG/ACT nasal spray Place 1 spray into the nose daily.      . diphenhydrAMINE (SOMINEX) 25 MG tablet Take 50 mg by mouth at bedtime as needed. For itching.        Past Surgical History  Procedure Date  . Cesarean section 1974, 1977, 1979  . Abdominal hysterectomy 1980's  . Varicose vein surgery 1970's    bilaterally    Allergies  Allergen Reactions  . Codeine Rash  . Levofloxacin Itching and Swelling    Lip swelling  . Rice Shortness Of Breath and Rash    History   Social History  . Marital Status: Divorced    Spouse Name: N/A    Number of Children: N/A  . Years of Education: N/A   Occupational History  . Not on file.   Social History Main Topics  . Smoking status: Never Smoker   . Smokeless tobacco: Never Used  . Alcohol Use: No  . Drug Use: No  . Sexually Active: Yes    Birth Control/ Protection: Surgical   Other Topics Concern  . Not on file   Social History Narrative  . No narrative  on file    Family History  Problem Relation Age of Onset  . Cancer Mother     lung  . Hyperlipidemia Mother   . Hypertension Mother   . Sudden death Neg Hx   . Diabetes Neg Hx   . Heart attack Neg Hx     BP 127/87  Pulse 88  Ht 5\' 4"  (1.626 m)  Wt 206 lb (93.441 kg)  BMI 35.36 kg/m2  Review of Systems See HPI above.    Objective:   Physical Exam Gen: NAD  Back: No gross deformity, scoliosis. TTP left paraspinal lumbar region.  No midline or bony TTP. FROM without pain. Strength LEs 5/5 all muscle groups.   3+ MSRs in patellar and achilles tendons, equal bilaterally. Negative SLRs. Sensation intact to light touch bilaterally. Negative logroll bilateral hips - limited ER right leg compared to left but not painful. Negative fabers and piriformis stretches.    Assessment & Plan:  1. Low back pain - history suggestive of lumbar radiculopathy but no red flags and exam otherwise benign.  Start with PT, prednisone  with norco and flexeril as needed for severe pain.  F/u in 1 month.  Consider MRI lumbar spine if not improving as expected.

## 2012-08-14 ENCOUNTER — Ambulatory Visit: Payer: BC Managed Care – PPO | Attending: Family Medicine | Admitting: Physical Therapy

## 2012-08-14 DIAGNOSIS — IMO0001 Reserved for inherently not codable concepts without codable children: Secondary | ICD-10-CM | POA: Insufficient documentation

## 2012-08-14 DIAGNOSIS — M25559 Pain in unspecified hip: Secondary | ICD-10-CM | POA: Insufficient documentation

## 2012-08-14 DIAGNOSIS — M545 Low back pain, unspecified: Secondary | ICD-10-CM | POA: Insufficient documentation

## 2012-08-15 ENCOUNTER — Telehealth: Payer: Self-pay | Admitting: Internal Medicine

## 2012-08-15 NOTE — Telephone Encounter (Signed)
Pt called in at 1211 on 08/15/2012 stating her middle right finger ( the one she had surgery on) has flared up... She states Dr. Reece Levy was going to refer her to another doctor and she knows this is with worker's comp.. But she needs some kind of relief... Please call pt at [phone]... Ad

## 2012-08-16 ENCOUNTER — Emergency Department (HOSPITAL_BASED_OUTPATIENT_CLINIC_OR_DEPARTMENT_OTHER)
Admission: EM | Admit: 2012-08-16 | Discharge: 2012-08-16 | Disposition: A | Discharge status: Home / Self Care | Payer: Worker's Compensation | Attending: Emergency Medicine | Admitting: Emergency Medicine

## 2012-08-16 ENCOUNTER — Ambulatory Visit: Payer: BC Managed Care – PPO | Admitting: Rehabilitation

## 2012-08-16 ENCOUNTER — Encounter (HOSPITAL_BASED_OUTPATIENT_CLINIC_OR_DEPARTMENT_OTHER): Payer: Self-pay

## 2012-08-16 DIAGNOSIS — Z86718 Personal history of other venous thrombosis and embolism: Secondary | ICD-10-CM | POA: Insufficient documentation

## 2012-08-16 DIAGNOSIS — E785 Hyperlipidemia, unspecified: Secondary | ICD-10-CM | POA: Insufficient documentation

## 2012-08-16 DIAGNOSIS — Z780 Asymptomatic menopausal state: Secondary | ICD-10-CM | POA: Insufficient documentation

## 2012-08-16 DIAGNOSIS — Z8673 Personal history of transient ischemic attack (TIA), and cerebral infarction without residual deficits: Secondary | ICD-10-CM | POA: Insufficient documentation

## 2012-08-16 DIAGNOSIS — R21 Rash and other nonspecific skin eruption: Secondary | ICD-10-CM | POA: Insufficient documentation

## 2012-08-16 DIAGNOSIS — J45909 Unspecified asthma, uncomplicated: Secondary | ICD-10-CM | POA: Insufficient documentation

## 2012-08-16 DIAGNOSIS — Z8739 Personal history of other diseases of the musculoskeletal system and connective tissue: Secondary | ICD-10-CM | POA: Insufficient documentation

## 2012-08-16 DIAGNOSIS — Z872 Personal history of diseases of the skin and subcutaneous tissue: Secondary | ICD-10-CM | POA: Insufficient documentation

## 2012-08-16 DIAGNOSIS — Z8669 Personal history of other diseases of the nervous system and sense organs: Secondary | ICD-10-CM | POA: Insufficient documentation

## 2012-08-16 DIAGNOSIS — Z7982 Long term (current) use of aspirin: Secondary | ICD-10-CM | POA: Insufficient documentation

## 2012-08-16 DIAGNOSIS — Z79899 Other long term (current) drug therapy: Secondary | ICD-10-CM | POA: Insufficient documentation

## 2012-08-16 DIAGNOSIS — Z8709 Personal history of other diseases of the respiratory system: Secondary | ICD-10-CM | POA: Insufficient documentation

## 2012-08-16 DIAGNOSIS — Z8619 Personal history of other infectious and parasitic diseases: Secondary | ICD-10-CM | POA: Insufficient documentation

## 2012-08-16 DIAGNOSIS — B0089 Other herpesviral infection: Secondary | ICD-10-CM | POA: Insufficient documentation

## 2012-08-16 DIAGNOSIS — E559 Vitamin D deficiency, unspecified: Secondary | ICD-10-CM | POA: Insufficient documentation

## 2012-08-16 DIAGNOSIS — J309 Allergic rhinitis, unspecified: Secondary | ICD-10-CM | POA: Insufficient documentation

## 2012-08-16 MED ORDER — VALACYCLOVIR HCL 1 G PO TABS: 1000.0000 mg | ORAL_TABLET | Freq: Three times a day (TID) | ORAL | Status: AC

## 2012-08-16 MED ORDER — HYDROCODONE-ACETAMINOPHEN 5-325 MG PO TABS: 1.0000 | ORAL_TABLET | ORAL | Status: DC | PRN

## 2012-08-16 MED ORDER — HYDROCODONE-ACETAMINOPHEN 5-325 MG PO TABS
1.0000 | ORAL_TABLET | Freq: Once | ORAL | Status: AC
  Administered 2012-08-16: 1 via ORAL
  Filled 2012-08-16: qty 1

## 2012-08-16 NOTE — ED Provider Notes (Signed)
History     CSN: [id number]  Arrival date & time 08/16/12  0903   First MD Initiated Contact with Patient 08/16/12 [phone]      Chief Complaint  Patient presents with  . Finger Injury    (Consider location/radiation/quality/duration/timing/severity/associated sxs/prior treatment) HPI Pt presents with 2 days of recurrent pain to middle finger of R hand. Pt states blisters have formed and that it is exquisitely tender. No fever chills or constitutional symptoms. Progression of blisters in the past is to pustule and then scabs over. She has a history of shingle and >1 year ago injury to the same finger.  Past Medical History  Diagnosis Date  . Shingles 04/01/2012    "have them q year; usually in Nov; for the last 20 years"  . Asthma   . Hyperlipidemia   . Vitamin D deficiency   . Allergy   . Itching   . Keloid     "q where; I have keloid skin"  . Menopause   . Right leg DVT 1980's  . Shortness of breath 04/01/2012    "when my asthma acts up"  . Migraines 04/01/2012  . Stroke 1977; 04/01/2012    denies residual; "from BCP RX"; little weak right arm; right face little numb"  . Arthritis     "ankles; right hip"    Past Surgical History  Procedure Date  . Cesarean section 1974, 1977, 1979  . Abdominal hysterectomy 1980's  . Varicose vein surgery 1970's    bilaterally    Family History  Problem Relation Age of Onset  . Cancer Mother     lung  . Hyperlipidemia Mother   . Hypertension Mother   . Sudden death Neg Hx   . Diabetes Neg Hx   . Heart attack Neg Hx     History  Substance Use Topics  . Smoking status: Never Smoker   . Smokeless tobacco: Never Used  . Alcohol Use: No    OB History    Grav Para Term Preterm Abortions TAB SAB Ect Mult Living   4 3   1 1           Review of Systems  Constitutional: Negative for fever, chills, diaphoresis and fatigue.  Skin: Positive for rash.  Neurological: Negative for weakness and numbness.    Allergies  Codeine;  Levofloxacin; and Rice  Home Medications   Current Outpatient Rx  Name  Route  Sig  Dispense  Refill  . ALBUTEROL SULFATE HFA 108 (90 BASE) MCG/ACT IN AERS   Inhalation   Inhale 2 puffs into the lungs every 6 (six) hours as needed. For shortness of breath or wheezing         . ASPIRIN 81 MG PO TBEC   Oral   Take 1 tablet (81 mg total) by mouth daily. Swallow whole.   30 tablet   12   . ATORVASTATIN CALCIUM 10 MG PO TABS   Oral   Take 1 tablet (10 mg total) by mouth daily.   90 tablet   1   . BUDESONIDE 32 MCG/ACT NA SUSP   Nasal   Place 1 spray into the nose daily.         . BUDESONIDE-FORMOTEROL FUMARATE 160-4.5 MCG/ACT IN AERO   Inhalation   Inhale 2 puffs into the lungs 2 (two) times daily.         . CYCLOBENZAPRINE HCL 10 MG PO TABS   Oral   Take 1 tablet (10 mg total) by mouth  every 8 (eight) hours as needed for muscle spasms.   60 tablet   1   . DIPHENHYDRAMINE HCL (SLEEP) 25 MG PO TABS   Oral   Take 50 mg by mouth at bedtime as needed. For itching.         Marland Kitchen ESCITALOPRAM OXALATE 10 MG PO TABS   Oral   Take 1 tablet (10 mg total) by mouth daily.   30 tablet   5   . HYDROCODONE-ACETAMINOPHEN 5-325 MG PO TABS   Oral   Take 1 tablet by mouth every 6 (six) hours as needed for pain.   60 tablet   0   . HYDROCODONE-ACETAMINOPHEN 5-325 MG PO TABS   Oral   Take 1 tablet by mouth every 4 (four) hours as needed for pain.   15 tablet   0   . MONTELUKAST SODIUM 10 MG PO TABS   Oral   Take 1 tablet (10 mg total) by mouth at bedtime.   30 tablet   5   . PREDNISONE (PAK) 10 MG PO TABS      6 tabs po day 1, 5 tabs po day 2, 4 tabs po day 3, 3 tabs po day 4, 2 tabs po day 5, 1 tab po day 6   21 tablet   0   . VALACYCLOVIR HCL 1 G PO TABS   Oral   Take 1 tablet (1,000 mg total) by mouth 3 (three) times daily.   21 tablet   0     BP 128/88  Pulse 79  Temp 98.9 F (37.2 C) (Oral)  Resp 16  Ht 5\' 4"  (1.626 m)  Wt 205 lb (92.987 kg)  BMI  35.19 kg/m2  SpO2 100%  Physical Exam  Nursing note and vitals reviewed. Constitutional: She is oriented to person, place, and time. She appears well-developed and well-nourished. No distress.  HENT:  Head: Normocephalic and atraumatic.  Cardiovascular: Normal rate.   Pulmonary/Chest: Effort normal.  Abdominal: Soft.  Musculoskeletal: Normal range of motion. She exhibits tenderness (R middle finger with vesicles on palmar distill tip with mild surrounding erythema consistent with herpetic disease. no obvious bacterioal superinfection. no flexor tendon tenderness. Marland Kitchen ). She exhibits no edema.  Neurological: She is alert and oriented to person, place, and time.  Skin: Skin is warm and dry. No rash noted. No erythema.  Psychiatric: She has a normal mood and affect. Her behavior is normal.    ED Course  Procedures (including critical care time)  Labs Reviewed - No data to display No results found.   1. Herpetic whitlow       MDM  History and presentation consistent with herpetic whitlow. Will treat and pt encouraged to return immediately for worsening symptoms and f/u with with her hand specialist.         Loren Racer, MD 08/16/12 1105

## 2012-08-16 NOTE — ED Notes (Signed)
Pt reports injury to 3rd digit on right hand 1 year ago, received surgery and has had recurrent infections. Pt reports pain in affected finger.

## 2012-08-20 ENCOUNTER — Ambulatory Visit: Payer: BC Managed Care – PPO | Admitting: Physical Therapy

## 2012-08-22 ENCOUNTER — Ambulatory Visit: Payer: BC Managed Care – PPO | Admitting: Rehabilitation

## 2012-08-26 ENCOUNTER — Ambulatory Visit: Payer: BC Managed Care – PPO | Admitting: Physical Therapy

## 2012-08-27 ENCOUNTER — Ambulatory Visit: Payer: BC Managed Care – PPO | Admitting: Rehabilitation

## 2012-08-29 ENCOUNTER — Ambulatory Visit: Payer: BC Managed Care – PPO | Admitting: Rehabilitation

## 2012-09-09 ENCOUNTER — Encounter: Payer: Self-pay | Admitting: Family Medicine

## 2012-09-09 ENCOUNTER — Ambulatory Visit (INDEPENDENT_AMBULATORY_CARE_PROVIDER_SITE_OTHER): Payer: BC Managed Care – PPO | Admitting: Family Medicine

## 2012-09-09 ENCOUNTER — Other Ambulatory Visit: Payer: Self-pay | Admitting: *Deleted

## 2012-09-09 VITALS — BP 117/77 | HR 98 | Ht 64.0 in | Wt 196.0 lb

## 2012-09-09 DIAGNOSIS — M545 Low back pain, unspecified: Secondary | ICD-10-CM

## 2012-09-09 MED ORDER — ESCITALOPRAM OXALATE 10 MG PO TABS: 10.0000 mg | ORAL_TABLET | Freq: Every day | ORAL | Status: DC

## 2012-09-09 MED ORDER — MONTELUKAST SODIUM 10 MG PO TABS: 10.0000 mg | ORAL_TABLET | Freq: Every day | ORAL | Status: DC

## 2012-09-09 MED ORDER — ATORVASTATIN CALCIUM 10 MG PO TABS: 10.0000 mg | ORAL_TABLET | Freq: Every day | ORAL | Status: DC

## 2012-09-09 NOTE — Telephone Encounter (Signed)
Refill request

## 2012-09-10 ENCOUNTER — Encounter: Payer: Self-pay | Admitting: Family Medicine

## 2012-09-10 NOTE — Progress Notes (Signed)
Subjective:    Patient ID: Anita Benton, female    DOB: 07-25-1956, 57 y.o.   MRN: [id number]  PCP: Dr. Constance Goltz  HPI  57 yo F here for f/u low back pain.  1/10: Patient reports she's had about 5 months of low back pain. Previously had symptoms going into her right leg and right-sided TIA. For about 2 months left sided low back pain has come on and worsened along with radiation now into left leg. Able to work through this but becomes fairly severe by 2pm. Takes vicodin, aleve as needed for pain. No numbness or tingling. No bowel/bladder dysfunction.  2/10: Patient feels much better even after a few PT visits. She had an infection of her finger and also having tooth pain (due to have surgery on this) so had to put PT on hold for a couple weeks. Doing home exercises. Lower back will have some pain after work. Finished prednisone. Takes norco occasionally if having a bad night. Done with flexeril. Walked yesterday for exercise for first time in a while. Still some radiation into left leg but less of this. No bowel/bladder dysfunction. No numbness/tingling.  Past Medical History  Diagnosis Date  . Shingles 04/01/2012    "have them q year; usually in Nov; for the last 20 years"  . Asthma   . Hyperlipidemia   . Vitamin D deficiency   . Allergy   . Itching   . Keloid     "q where; I have keloid skin"  . Menopause   . Right leg DVT 1980's  . Shortness of breath 04/01/2012    "when my asthma acts up"  . Migraines 04/01/2012  . Stroke 1977; 04/01/2012    denies residual; "from BCP RX"; little weak right arm; right face little numb"  . Arthritis     "ankles; right hip"    Current Outpatient Prescriptions on File Prior to Visit  Medication Sig Dispense Refill  . albuterol (PROVENTIL HFA;VENTOLIN HFA) 108 (90 BASE) MCG/ACT inhaler Inhale 2 puffs into the lungs every 6 (six) hours as needed. For shortness of breath or wheezing      . aspirin 81 MG EC tablet Take 1 tablet  (81 mg total) by mouth daily. Swallow whole.  30 tablet  12  . budesonide (RHINOCORT AQUA) 32 MCG/ACT nasal spray Place 1 spray into the nose daily.      . budesonide-formoterol (SYMBICORT) 160-4.5 MCG/ACT inhaler Inhale 2 puffs into the lungs 2 (two) times daily.      . cyclobenzaprine (FLEXERIL) 10 MG tablet Take 1 tablet (10 mg total) by mouth every 8 (eight) hours as needed for muscle spasms.  60 tablet  1  . diphenhydrAMINE (SOMINEX) 25 MG tablet Take 50 mg by mouth at bedtime as needed. For itching.      Marland Kitchen HYDROcodone-acetaminophen (NORCO) 5-325 MG per tablet Take 1 tablet by mouth every 6 (six) hours as needed for pain.  60 tablet  0   No current facility-administered medications on file prior to visit.    Past Surgical History  Procedure Laterality Date  . Cesarean section  1974, 1977, 1979  . Abdominal hysterectomy  1980's  . Varicose vein surgery  1970's    bilaterally    Allergies  Allergen Reactions  . Codeine Rash  . Levofloxacin Itching and Swelling    Lip swelling  . Rice Shortness Of Breath and Rash    History   Social History  . Marital Status: Divorced    Spouse  Name: N/A    Number of Children: N/A  . Years of Education: N/A   Occupational History  . Not on file.   Social History Main Topics  . Smoking status: Never Smoker   . Smokeless tobacco: Never Used  . Alcohol Use: No  . Drug Use: No  . Sexually Active: Yes    Birth Control/ Protection: Surgical   Other Topics Concern  . Not on file   Social History Narrative  . No narrative on file    Family History  Problem Relation Age of Onset  . Cancer Mother     lung  . Hyperlipidemia Mother   . Hypertension Mother   . Sudden death Neg Hx   . Diabetes Neg Hx   . Heart attack Neg Hx     BP 117/77  Pulse 98  Ht 5\' 4"  (1.626 m)  Wt 196 lb (88.905 kg)  BMI 33.63 kg/m2  Review of Systems  See HPI above.    Objective:   Physical Exam  Gen: NAD  Back: No gross deformity,  scoliosis. Very mild TTP left paraspinal lumbar region.  No midline or bony TTP. FROM without pain. Strength LEs 5/5 all muscle groups.   3+ MSRs in patellar and achilles tendons, equal bilaterally. Negative SLRs. Sensation intact to light touch bilaterally. Negative logroll bilateral hips - limited ER right leg compared to left but not painful.    Assessment & Plan:  1. Low back pain - Good clinical improvement of her lumbar radiculopathy with conservative measures.  Continue with formal PT.  Norco, flexeril as needed.  F/u in 6 weeks or as needed.  Consider MRI lumbar spine if not improving as expected.

## 2012-09-10 NOTE — Assessment & Plan Note (Signed)
Good clinical improvement of her lumbar radiculopathy with conservative measures.  Continue with formal PT.  Norco, flexeril as needed.  F/u in 6 weeks or as needed.  Consider MRI lumbar spine if not improving as expected.

## 2012-09-23 ENCOUNTER — Ambulatory Visit: Payer: BC Managed Care – PPO | Attending: Family Medicine | Admitting: Rehabilitation

## 2012-09-23 DIAGNOSIS — M545 Low back pain, unspecified: Secondary | ICD-10-CM | POA: Insufficient documentation

## 2012-09-23 DIAGNOSIS — M25559 Pain in unspecified hip: Secondary | ICD-10-CM | POA: Insufficient documentation

## 2012-09-23 DIAGNOSIS — IMO0001 Reserved for inherently not codable concepts without codable children: Secondary | ICD-10-CM | POA: Insufficient documentation

## 2012-09-27 ENCOUNTER — Ambulatory Visit: Payer: BC Managed Care – PPO | Admitting: Physical Therapy

## 2012-09-30 ENCOUNTER — Ambulatory Visit: Payer: BC Managed Care – PPO | Admitting: Rehabilitation

## 2012-10-02 ENCOUNTER — Ambulatory Visit: Payer: BC Managed Care – PPO | Attending: Family Medicine | Admitting: Rehabilitation

## 2012-10-02 DIAGNOSIS — M545 Low back pain, unspecified: Secondary | ICD-10-CM | POA: Insufficient documentation

## 2012-10-02 DIAGNOSIS — IMO0001 Reserved for inherently not codable concepts without codable children: Secondary | ICD-10-CM | POA: Insufficient documentation

## 2012-10-02 DIAGNOSIS — M25559 Pain in unspecified hip: Secondary | ICD-10-CM | POA: Insufficient documentation

## 2012-10-07 ENCOUNTER — Ambulatory Visit: Payer: BC Managed Care – PPO | Admitting: Physical Therapy

## 2012-10-09 ENCOUNTER — Ambulatory Visit: Payer: BC Managed Care – PPO | Admitting: Rehabilitation

## 2012-10-21 ENCOUNTER — Encounter: Payer: Self-pay | Admitting: Family Medicine

## 2012-10-21 ENCOUNTER — Ambulatory Visit (INDEPENDENT_AMBULATORY_CARE_PROVIDER_SITE_OTHER): Payer: BC Managed Care – PPO | Admitting: Family Medicine

## 2012-10-21 VITALS — BP 119/80 | HR 87 | Ht 64.0 in | Wt 200.0 lb

## 2012-10-21 DIAGNOSIS — M545 Low back pain, unspecified: Secondary | ICD-10-CM

## 2012-10-21 DIAGNOSIS — M25551 Pain in right hip: Secondary | ICD-10-CM

## 2012-10-21 DIAGNOSIS — M7989 Other specified soft tissue disorders: Secondary | ICD-10-CM

## 2012-10-21 DIAGNOSIS — M25559 Pain in unspecified hip: Secondary | ICD-10-CM

## 2012-10-21 NOTE — Progress Notes (Signed)
Subjective:    Patient ID: Anita Benton, female    DOB: [birth year], 57 y.o.   MRN: [id number]  PCP: Dr. Constance Goltz  HPI  57 yo F here for f/u low back pain.  1/10: Patient reports she's had about 5 months of low back pain. Previously had symptoms going into her right leg and right-sided TIA. For about 2 months left sided low back pain has come on and worsened along with radiation now into left leg. Able to work through this but becomes fairly severe by 2pm. Takes vicodin, aleve as needed for pain. No numbness or tingling. No bowel/bladder dysfunction.  2/10: Patient feels much better even after a few PT visits. She had an infection of her finger and also having tooth pain (due to have surgery on this) so had to put PT on hold for a couple weeks. Doing home exercises. Lower back will have some pain after work. Finished prednisone. Takes norco occasionally if having a bad night. Done with flexeril. Walked yesterday for exercise for first time in a while. Still some radiation into left leg but less of this. No bowel/bladder dysfunction. No numbness/tingling.  3/24: Patient is pleased with progress from back pain. Continues with home exercise program. Right hip is bothering her more now but improves with aleve so would like to wait on intervention for this. Does not take any other medications for pain. No radiation of pain into legs now. No bowel/bladder dysfunction. No numbness/tingling. Completed PT. Also noted an area of swelling on inside part of right foot - worse at end of the day and painful.  Past Medical History  Diagnosis Date  . Shingles 04/01/2012    "have them q year; usually in Nov; for the last 20 years"  . Asthma   . Hyperlipidemia   . Vitamin D deficiency   . Allergy   . Itching   . Keloid     "q where; I have keloid skin"  . Menopause   . Right leg DVT 1980's  . Shortness of breath 04/01/2012    "when my asthma acts up"  . Migraines 04/01/2012   . Stroke 1977; 04/01/2012    denies residual; "from BCP RX"; little weak right arm; right face little numb"  . Arthritis     "ankles; right hip"    Current Outpatient Prescriptions on File Prior to Visit  Medication Sig Dispense Refill  . albuterol (PROVENTIL HFA;VENTOLIN HFA) 108 (90 BASE) MCG/ACT inhaler Inhale 2 puffs into the lungs every 6 (six) hours as needed. For shortness of breath or wheezing      . aspirin 81 MG EC tablet Take 1 tablet (81 mg total) by mouth daily. Swallow whole.  30 tablet  12  . atorvastatin (LIPITOR) 10 MG tablet Take 1 tablet (10 mg total) by mouth daily.  90 tablet  1  . budesonide (RHINOCORT AQUA) 32 MCG/ACT nasal spray Place 1 spray into the nose daily.      . budesonide-formoterol (SYMBICORT) 160-4.5 MCG/ACT inhaler Inhale 2 puffs into the lungs 2 (two) times daily.      . diphenhydrAMINE (SOMINEX) 25 MG tablet Take 50 mg by mouth at bedtime as needed. For itching.      . escitalopram (LEXAPRO) 10 MG tablet Take 1 tablet (10 mg total) by mouth daily.  30 tablet  5  . HYDROcodone-acetaminophen (NORCO) 5-325 MG per tablet Take 1 tablet by mouth every 6 (six) hours as needed for pain.  60 tablet  0  .  montelukast (SINGULAIR) 10 MG tablet Take 1 tablet (10 mg total) by mouth at bedtime.  30 tablet  5  . penicillin v potassium (VEETID) 500 MG tablet        No current facility-administered medications on file prior to visit.    Past Surgical History  Procedure Laterality Date  . Cesarean section  1974, 1977, 1979  . Abdominal hysterectomy  1980's  . Varicose vein surgery  1970's    bilaterally    Allergies  Allergen Reactions  . Codeine Rash  . Levofloxacin Itching and Swelling    Lip swelling  . Rice Shortness Of Breath and Rash    History   Social History  . Marital Status: Divorced    Spouse Name: N/A    Number of Children: N/A  . Years of Education: N/A   Occupational History  . Not on file.   Social History Main Topics  . Smoking  status: Never Smoker   . Smokeless tobacco: Never Used  . Alcohol Use: No  . Drug Use: No  . Sexually Active: Yes    Birth Control/ Protection: Surgical   Other Topics Concern  . Not on file   Social History Narrative  . No narrative on file    Family History  Problem Relation Age of Onset  . Cancer Mother     lung  . Hyperlipidemia Mother   . Hypertension Mother   . Sudden death Neg Hx   . Diabetes Neg Hx   . Heart attack Neg Hx     BP 119/80  Pulse 87  Ht 5\' 4"  (1.626 m)  Wt 200 lb (90.719 kg)  BMI 34.31 kg/m2  Review of Systems  See HPI above.    Objective:   Physical Exam  Gen: NAD  Back: No gross deformity, scoliosis. Mild TTP bilateral paraspinal regions.  No midline or bony TTP. FROM without pain. Strength LEs 5/5 all muscle groups.   3+ MSRs in patellar and achilles tendons, equal bilaterally. Negative SLRs. Sensation intact to light touch bilaterally. Mild positive logroll right hip.  Negative left.  R foot: Mild swelling but no palpable mass medial aspect of foot inferior to navicular but not within plantar fascia. No current tenderness here. No bruising, other deformity. No pain with ankle motions and 5/5 strength.    Assessment & Plan:  1. Right hip pain - Primary issue now with hip pain, positive logroll.  She wants to continue with conservative measures for now - tylenol, aleve, glucosamine, capsaicin.  If not improving advised to give [place] a call and can refer for an intraarticular right hip injection.  2. Low back pain - Good clinical improvement of her lumbar radiculopathy with conservative measures. More low back pain today but primary issue is hip pain.  Continue with home exercises, aleve.    3. Right foot swelling - no nodule, cyst, other abnormality noted on ultrasound.  Area of question shows only soft tissue underneath this.  Reassured.  Icing, arch binders, arch support, elevation, aleve.

## 2012-10-21 NOTE — Patient Instructions (Addendum)
If your pain in your hip gets bad enough we can refer you for a cortisone shot - just call us if this occurs. Take tylenol 500mg  1-2 tabs three times a day for pain. Aleve 1-2 tabs twice a day with food Glucosamine sulfate 750mg  twice a day is a supplement that has been shown to help moderate to severe arthritis. Capsaicin topically up to four times a day may also help with pain. Cortisone injections are an option. It's important that you continue to stay active. If you are overweight, try to lose weight through diet and exercise. Shoe inserts with good arch support may be helpful. Heat or ice 15 minutes at a time 3-4 times a day as needed to help with pain. Water aerobics and cycling with low resistance are the best two types of exercise for arthritis.  For the area on your foot I would recommend icing 15 minutes at a time 3-4 times a day. Icing 15 minutes at a time 3-4 times a day. Elevate above your heart as much as possible. Arch supports ([employer] Jari [employer] inserts, active series) may be helpful as well. Continue aleve for this as well. Stay off your feet when you can.  Otherwise follow-up with me as needed.

## 2012-10-21 NOTE — Assessment & Plan Note (Signed)
Good clinical improvement of her lumbar radiculopathy with conservative measures. More low back pain today but primary issue is hip pain.  Continue with home exercises, aleve.

## 2012-10-21 NOTE — Assessment & Plan Note (Signed)
no nodule, cyst, other abnormality noted on ultrasound.  Area of question shows only soft tissue underneath this.  Reassured.  Icing, arch binders, arch support, elevation, aleve.

## 2012-10-21 NOTE — Assessment & Plan Note (Signed)
Primary issue now with hip pain, positive logroll.  She wants to continue with conservative measures for now - tylenol, aleve, glucosamine, capsaicin.  If not improving advised to give [place] a call and can refer for an intraarticular right hip injection.

## 2013-03-26 ENCOUNTER — Ambulatory Visit: Payer: Self-pay | Admitting: Internal Medicine

## 2013-03-26 ENCOUNTER — Telehealth: Payer: Self-pay | Admitting: *Deleted

## 2013-03-26 NOTE — Telephone Encounter (Signed)
Advised pt to be seen at [facility] in [place]. Will call pt in the am  To check on her

## 2013-04-16 ENCOUNTER — Ambulatory Visit (INDEPENDENT_AMBULATORY_CARE_PROVIDER_SITE_OTHER): Payer: BC Managed Care – PPO | Admitting: Internal Medicine

## 2013-04-16 ENCOUNTER — Ambulatory Visit (HOSPITAL_BASED_OUTPATIENT_CLINIC_OR_DEPARTMENT_OTHER)
Admission: RE | Admit: 2013-04-16 | Discharge: 2013-04-16 | Disposition: A | Discharge status: Home / Self Care | Payer: BC Managed Care – PPO | Source: Ambulatory Visit | Attending: Internal Medicine | Admitting: Internal Medicine

## 2013-04-16 ENCOUNTER — Encounter: Payer: Self-pay | Admitting: Internal Medicine

## 2013-04-16 VITALS — BP 133/91 | HR 77 | Temp 97.4°F | Resp 18 | Wt 199.0 lb

## 2013-04-16 DIAGNOSIS — R600 Localized edema: Secondary | ICD-10-CM

## 2013-04-16 DIAGNOSIS — Z86718 Personal history of other venous thrombosis and embolism: Secondary | ICD-10-CM | POA: Insufficient documentation

## 2013-04-16 DIAGNOSIS — R609 Edema, unspecified: Secondary | ICD-10-CM

## 2013-04-16 DIAGNOSIS — Z23 Encounter for immunization: Secondary | ICD-10-CM

## 2013-04-16 DIAGNOSIS — M7989 Other specified soft tissue disorders: Secondary | ICD-10-CM | POA: Insufficient documentation

## 2013-04-16 MED ORDER — POTASSIUM CHLORIDE CRYS ER 20 MEQ PO TBCR: 20.0000 meq | EXTENDED_RELEASE_TABLET | Freq: Every day | ORAL | Status: DC

## 2013-04-16 MED ORDER — HYDROCHLOROTHIAZIDE 25 MG PO TABS: 25.0000 mg | ORAL_TABLET | Freq: Every day | ORAL | Status: DC

## 2013-04-16 NOTE — Progress Notes (Signed)
Subjective:    Patient ID: Anita Benton, female    DOB: 1956/07/01, 57 y.o.   MRN: [id number]  HPI Anita Benton is here for acute visit.  She has had several days of bilateral LE edema  L > R. With calf pain on the left  She reports she did have bilateral vein stripping procedure and her legs will swell at times.   She reports seh does have a history of postoperative DVT  Allergies  Allergen Reactions  . Codeine Rash  . Levofloxacin Itching and Swelling    Lip swelling  . Rice Shortness Of Breath and Rash   Past Medical History  Diagnosis Date  . Shingles 04/01/2012    "have them q year; usually in Nov; for the last 20 years"  . Asthma   . Hyperlipidemia   . Vitamin D deficiency   . Allergy   . Itching   . Keloid     "q where; I have keloid skin"  . Menopause   . Right leg DVT 1980's  . Shortness of breath 04/01/2012    "when my asthma acts up"  . Migraines 04/01/2012  . Stroke 1977; 04/01/2012    denies residual; "from BCP RX"; little weak right arm; right face little numb"  . Arthritis     "ankles; right hip"   Past Surgical History  Procedure Laterality Date  . Cesarean section  1974, 1977, 1979  . Abdominal hysterectomy  1980's  . Varicose vein surgery  1970's    bilaterally   History   Social History  . Marital Status: Divorced    Spouse Name: N/A    Number of Children: N/A  . Years of Education: N/A   Occupational History  . Not on file.   Social History Main Topics  . Smoking status: Never Smoker   . Smokeless tobacco: Never Used  . Alcohol Use: No  . Drug Use: No  . Sexual Activity: Yes    Birth Control/ Protection: Surgical   Other Topics Concern  . Not on file   Social History Narrative  . No narrative on file   Family History  Problem Relation Age of Onset  . Cancer Mother     lung  . Hyperlipidemia Mother   . Hypertension Mother   . Sudden death Neg Hx   . Diabetes Neg Hx   . Heart attack Neg Hx    Patient Active Problem List   Diagnosis Date Noted  . Foot swelling 10/21/2012  . Low back pain 08/09/2012  . URI, acute 08/05/2012  . Cough 08/05/2012  . Left hip pain 08/05/2012  . Zoster 07/02/2012  . Right hip pain 05/23/2012  . Herniated disc, cervical 04/09/2012  . Headache, hemiplegic migraine 04/04/2012  . Gait abnormality 04/03/2012  . HTN (hypertension) 04/01/2012  . Finger pain, right 04/01/2012  . Finger, open wounds, complicated 08/31/2011  . Varicose veins 06/27/2011  . Urinary tract infection, site not specified 06/27/2011  . Nevus 06/27/2011  . DVT, lower extremity 06/13/2011  . Arthritis   . Asthma in adult   . Hyperlipidemia   . Shingles   . Vitamin d deficiency   . Allergy   . Keloid   . Menopause    Current Outpatient Prescriptions on File Prior to Visit  Medication Sig Dispense Refill  . albuterol (PROVENTIL HFA;VENTOLIN HFA) 108 (90 BASE) MCG/ACT inhaler Inhale 2 puffs into the lungs every 6 (six) hours as needed. For shortness of breath or wheezing      .  atorvastatin (LIPITOR) 10 MG tablet Take 1 tablet (10 mg total) by mouth daily.  90 tablet  1  . budesonide (RHINOCORT AQUA) 32 MCG/ACT nasal spray Place 1 spray into the nose daily.      . diphenhydrAMINE (SOMINEX) 25 MG tablet Take 50 mg by mouth at bedtime as needed. For itching.      . escitalopram (LEXAPRO) 10 MG tablet Take 1 tablet (10 mg total) by mouth daily.  30 tablet  5  . montelukast (SINGULAIR) 10 MG tablet Take 1 tablet (10 mg total) by mouth at bedtime.  30 tablet  5  . budesonide-formoterol (SYMBICORT) 160-4.5 MCG/ACT inhaler Inhale 2 puffs into the lungs 2 (two) times daily.       No current facility-administered medications on file prior to visit.         Review of Systems See HPI     Objective:   Physical Exam Physical Exam  Nursing note and vitals reviewed.  Constitutional: She is oriented to person, place, and time. She appears well-developed and well-nourished.  HENT:  Head: Normocephalic and  atraumatic.  Cardiovascular: Normal rate and regular rhythm. Exam reveals no gallop and no friction rub.  No murmur heard.  Pulmonary/Chest: Breath sounds normal. She has no wheezes. She has no rales.  Neurological: She is alert and oriented to person, place, and time.  Skin: Skin is warm and dry.  Ext bilateral leg edema.  L> R  No venous chords homans neg Psychiatric: She has a normal mood and affect. Her behavior is normal.              Assessment & Plan:  LE edema  With history of DVT will get venous ultrasound  Addendum  Ultrasound neg for DVT  Will give HCTZ 25 mg one daily for the next [redacted] weeks along with K-DUR  She is to see me in two weeks.  Will need ECHO eval at that time

## 2013-04-29 ENCOUNTER — Encounter: Payer: Self-pay | Admitting: Internal Medicine

## 2013-04-29 ENCOUNTER — Ambulatory Visit (INDEPENDENT_AMBULATORY_CARE_PROVIDER_SITE_OTHER): Payer: BC Managed Care – PPO | Admitting: Internal Medicine

## 2013-04-29 VITALS — BP 128/78 | HR 69 | Temp 97.3°F | Resp 18 | Wt 199.0 lb

## 2013-04-29 DIAGNOSIS — I8390 Asymptomatic varicose veins of unspecified lower extremity: Secondary | ICD-10-CM

## 2013-04-29 DIAGNOSIS — R609 Edema, unspecified: Secondary | ICD-10-CM | POA: Insufficient documentation

## 2013-04-29 NOTE — Progress Notes (Signed)
Subjective:    Patient ID: Anita Benton, female    DOB: 01/10/1956, 57 y.o.   MRN: [id number]  HPI  Anita Benton is here for follow up  .  Venous doppler neg for DVT  Edema  Resolved with HCTZ    She will finish her 2 week course soon  No SOb no chest pain  Allergies  Allergen Reactions  . Codeine Rash  . Levofloxacin Itching and Swelling    Lip swelling  . Rice Shortness Of Breath and Rash   Past Medical History  Diagnosis Date  . Shingles 04/01/2012    "have them q year; usually in Nov; for the last 20 years"  . Asthma   . Hyperlipidemia   . Vitamin D deficiency   . Allergy   . Itching   . Keloid     "q where; I have keloid skin"  . Menopause   . Right leg DVT 1980's  . Shortness of breath 04/01/2012    "when my asthma acts up"  . Migraines 04/01/2012  . Stroke 1977; 04/01/2012    denies residual; "from BCP RX"; little weak right arm; right face little numb"  . Arthritis     "ankles; right hip"   Past Surgical History  Procedure Laterality Date  . Cesarean section  1974, 1977, 1979  . Abdominal hysterectomy  1980's  . Varicose vein surgery  1970's    bilaterally   History   Social History  . Marital Status: Divorced    Spouse Name: N/A    Number of Children: N/A  . Years of Education: N/A   Occupational History  . Not on file.   Social History Main Topics  . Smoking status: Never Smoker   . Smokeless tobacco: Never Used  . Alcohol Use: No  . Drug Use: No  . Sexual Activity: Yes    Birth Control/ Protection: Surgical   Other Topics Concern  . Not on file   Social History Narrative  . No narrative on file   Family History  Problem Relation Age of Onset  . Cancer Mother     lung  . Hyperlipidemia Mother   . Hypertension Mother   . Sudden death Neg Hx   . Diabetes Neg Hx   . Heart attack Neg Hx    Patient Active Problem List   Diagnosis Date Noted  . Edema 04/29/2013  . Foot swelling 10/21/2012  . Low back pain 08/09/2012  . URI, acute  08/05/2012  . Cough 08/05/2012  . Left hip pain 08/05/2012  . Zoster 07/02/2012  . Right hip pain 05/23/2012  . Herniated disc, cervical 04/09/2012  . Headache, hemiplegic migraine 04/04/2012  . Gait abnormality 04/03/2012  . HTN (hypertension) 04/01/2012  . Finger pain, right 04/01/2012  . Finger, open wounds, complicated 08/31/2011  . Varicose veins 06/27/2011  . Urinary tract infection, site not specified 06/27/2011  . Nevus 06/27/2011  . DVT, lower extremity 06/13/2011  . Arthritis   . Asthma in adult   . Hyperlipidemia   . Shingles   . Vitamin d deficiency   . Allergy   . Keloid   . Menopause    Current Outpatient Prescriptions on File Prior to Visit  Medication Sig Dispense Refill  . albuterol (PROVENTIL HFA;VENTOLIN HFA) 108 (90 BASE) MCG/ACT inhaler Inhale 2 puffs into the lungs every 6 (six) hours as needed. For shortness of breath or wheezing      . budesonide (RHINOCORT AQUA) 32 MCG/ACT nasal spray Place 1  spray into the nose daily.      . diphenhydrAMINE (SOMINEX) 25 MG tablet Take 50 mg by mouth at bedtime as needed. For itching.      . escitalopram (LEXAPRO) 10 MG tablet Take 1 tablet (10 mg total) by mouth daily.  30 tablet  5  . hydrochlorothiazide (HYDRODIURIL) 25 MG tablet Take 1 tablet (25 mg total) by mouth daily.  30 tablet  0  . montelukast (SINGULAIR) 10 MG tablet Take 1 tablet (10 mg total) by mouth at bedtime.  30 tablet  5  . potassium chloride SA (K-DUR,KLOR-CON) 20 MEQ tablet Take 1 tablet (20 mEq total) by mouth daily.  30 tablet  1  . atorvastatin (LIPITOR) 10 MG tablet Take 1 tablet (10 mg total) by mouth daily.  90 tablet  1  . budesonide-formoterol (SYMBICORT) 160-4.5 MCG/ACT inhaler Inhale 2 puffs into the lungs 2 (two) times daily.       No current facility-administered medications on file prior to visit.      Review of Systems See HPI    Objective:   Physical Exam Physical Exam  Nursing note and vitals reviewed.  Constitutional: She  is oriented to person, place, and time. She appears well-developed and well-nourished.  HENT:  Head: Normocephalic and atraumatic.  Cardiovascular: Normal rate and regular rhythm. Exam reveals no gallop and no friction rub.  No murmur heard.  Pulmonary/Chest: Breath sounds normal. She has no wheezes. She has no rales.  Neurological: She is alert and oriented to person, place, and time.  Skin: Skin is warm and dry.  Ext  No edema Psychiatric: She has a normal mood and affect. Her behavior is normal.        Assessment & Plan:  Bilateral LE edema  Will schedule 2-D echo .  Will stop HCTZ for now and pt advised to see me in office if edema returns  Venous varicosities  [place] CPE with me

## 2013-04-29 NOTE — Patient Instructions (Addendum)
Schedule  2-d echo at [facility]   "lower extremity edema"  Check EF

## 2013-05-02 ENCOUNTER — Ambulatory Visit (HOSPITAL_COMMUNITY)
Admission: RE | Admit: 2013-05-02 | Discharge: 2013-05-02 | Disposition: A | Discharge status: Home / Self Care | Payer: BC Managed Care – PPO | Source: Ambulatory Visit | Attending: Internal Medicine | Admitting: Internal Medicine

## 2013-05-02 DIAGNOSIS — I10 Essential (primary) hypertension: Secondary | ICD-10-CM | POA: Insufficient documentation

## 2013-05-02 DIAGNOSIS — I517 Cardiomegaly: Secondary | ICD-10-CM

## 2013-05-02 DIAGNOSIS — M545 Low back pain, unspecified: Secondary | ICD-10-CM | POA: Insufficient documentation

## 2013-05-02 DIAGNOSIS — R609 Edema, unspecified: Secondary | ICD-10-CM | POA: Insufficient documentation

## 2013-05-02 DIAGNOSIS — R0989 Other specified symptoms and signs involving the circulatory and respiratory systems: Secondary | ICD-10-CM | POA: Insufficient documentation

## 2013-05-02 DIAGNOSIS — R0609 Other forms of dyspnea: Secondary | ICD-10-CM | POA: Insufficient documentation

## 2013-05-02 DIAGNOSIS — E785 Hyperlipidemia, unspecified: Secondary | ICD-10-CM | POA: Insufficient documentation

## 2013-05-02 DIAGNOSIS — J45909 Unspecified asthma, uncomplicated: Secondary | ICD-10-CM | POA: Insufficient documentation

## 2013-05-02 NOTE — Progress Notes (Signed)
  Echocardiogram 2D Echocardiogram has been performed.  Raquan Iannone 05/02/2013, 11:12 AM

## 2013-05-05 ENCOUNTER — Telehealth: Payer: Self-pay | Admitting: *Deleted

## 2013-05-05 NOTE — Telephone Encounter (Signed)
Unable to leave message Message stating that pt is not taking calls at this time

## 2013-05-07 ENCOUNTER — Telehealth: Payer: Self-pay | Admitting: *Deleted

## 2013-05-14 NOTE — Telephone Encounter (Signed)
error 

## 2013-06-05 ENCOUNTER — Telehealth: Payer: Self-pay | Admitting: *Deleted

## 2013-06-05 NOTE — Telephone Encounter (Signed)
Pt called stating that her finger is "cutting up again"

## 2013-08-27 ENCOUNTER — Encounter: Payer: Self-pay | Admitting: Internal Medicine

## 2013-09-03 ENCOUNTER — Encounter: Payer: Self-pay | Admitting: Internal Medicine

## 2013-10-13 ENCOUNTER — Encounter: Payer: Self-pay | Admitting: Internal Medicine

## 2013-10-13 ENCOUNTER — Ambulatory Visit (INDEPENDENT_AMBULATORY_CARE_PROVIDER_SITE_OTHER): Payer: BC Managed Care – PPO | Admitting: Internal Medicine

## 2013-10-13 VITALS — BP 128/86 | HR 76 | Temp 97.7°F | Resp 18 | Wt 211.0 lb

## 2013-10-13 DIAGNOSIS — M25551 Pain in right hip: Secondary | ICD-10-CM

## 2013-10-13 DIAGNOSIS — I10 Essential (primary) hypertension: Secondary | ICD-10-CM

## 2013-10-13 DIAGNOSIS — M25559 Pain in unspecified hip: Secondary | ICD-10-CM

## 2013-10-13 DIAGNOSIS — E785 Hyperlipidemia, unspecified: Secondary | ICD-10-CM

## 2013-10-13 DIAGNOSIS — Z139 Encounter for screening, unspecified: Secondary | ICD-10-CM

## 2013-10-13 LAB — COMPREHENSIVE METABOLIC PANEL
ALK PHOS: 64 U/L (ref 39–117)
ALT: 12 U/L (ref 0–35)
AST: 16 U/L (ref 0–37)
Albumin: 3.8 g/dL (ref 3.5–5.2)
BILIRUBIN TOTAL: 0.4 mg/dL (ref 0.2–1.2)
BUN: 17 mg/dL (ref 6–23)
CO2: 27 meq/L (ref 19–32)
CREATININE: 0.7 mg/dL (ref 0.50–1.10)
Calcium: 9.1 mg/dL (ref 8.4–10.5)
Chloride: 105 mEq/L (ref 96–112)
Glucose, Bld: 91 mg/dL (ref 70–99)
Potassium: 3.8 mEq/L (ref 3.5–5.3)
Sodium: 141 mEq/L (ref 135–145)
TOTAL PROTEIN: 6.9 g/dL (ref 6.0–8.3)

## 2013-10-13 LAB — LIPID PANEL
CHOLESTEROL: 238 mg/dL — AB (ref 0–200)
HDL: 55 mg/dL (ref >39)
LDL CALC: 150 mg/dL — AB (ref 0–99)
TRIGLYCERIDES: 167 mg/dL — AB (ref <150)
Total CHOL/HDL Ratio: 4.3 Ratio
VLDL: 33 mg/dL (ref 0–40)

## 2013-10-13 LAB — CBC WITH DIFFERENTIAL/PLATELET
BASOS PCT: 0 % (ref 0–1)
Basophils Absolute: 0 10*3/uL (ref 0.0–0.1)
EOS PCT: 1 % (ref 0–5)
Eosinophils Absolute: 0.1 10*3/uL (ref 0.0–0.7)
HCT: 40.1 % (ref 36.0–46.0)
Hemoglobin: 13.9 g/dL (ref 12.0–15.0)
LYMPHS ABS: 2.3 10*3/uL (ref 0.7–4.0)
Lymphocytes Relative: 35 % (ref 12–46)
MCH: 29.6 pg (ref 26.0–34.0)
MCHC: 34.7 g/dL (ref 30.0–36.0)
MCV: 85.3 fL (ref 78.0–100.0)
MONO ABS: 0.4 10*3/uL (ref 0.1–1.0)
MONOS PCT: 6 % (ref 3–12)
NEUTROS PCT: 58 % (ref 43–77)
Neutro Abs: 3.8 10*3/uL (ref 1.7–7.7)
Platelets: 327 10*3/uL (ref 150–400)
RBC: 4.7 MIL/uL (ref 3.87–5.11)
RDW: 13.5 % (ref 11.5–15.5)
WBC: 6.5 10*3/uL (ref 4.0–10.5)

## 2013-10-13 MED ORDER — METHYLPREDNISOLONE ACETATE 80 MG/ML IJ SUSP
80.0000 mg | Freq: Once | INTRAMUSCULAR | Status: AC
  Administered 2013-10-13: 80 mg via INTRAMUSCULAR

## 2013-10-13 MED ORDER — POTASSIUM CHLORIDE CRYS ER 20 MEQ PO TBCR
20.0000 meq | EXTENDED_RELEASE_TABLET | Freq: Every day | ORAL | Status: DC
Start: 2013-10-13 — End: 2015-02-09

## 2013-10-13 MED ORDER — ESCITALOPRAM OXALATE 10 MG PO TABS: 10.0000 mg | ORAL_TABLET | Freq: Every day | ORAL | Status: DC

## 2013-10-13 MED ORDER — MONTELUKAST SODIUM 10 MG PO TABS: 10.0000 mg | ORAL_TABLET | Freq: Every day | ORAL | Status: DC

## 2013-10-13 MED ORDER — ATORVASTATIN CALCIUM 10 MG PO TABS
10.0000 mg | ORAL_TABLET | Freq: Every day | ORAL | Status: DC
Start: 2013-10-13 — End: 2015-02-09

## 2013-10-13 MED ORDER — HYDROCHLOROTHIAZIDE 25 MG PO TABS: 25.0000 mg | ORAL_TABLET | Freq: Every day | ORAL | Status: DC

## 2013-10-13 NOTE — Addendum Note (Signed)
Addended by: Conley Rolls on: 10/13/2013 09:28 AM   Modules accepted: Orders

## 2013-10-13 NOTE — Progress Notes (Signed)
Subjective:    Patient ID: Anita Benton, female    DOB: 1955-09-25, 58 y.o.   MRN: [id number]  HPI  Anita Benton is here for acute visit.   I have not seen her in quite some time.  She has been having worsening R hip pain to movement.  She has not been taking anything for this.  Plain films 2013 show DJD and bone spurring.   NO new injury or trauma to R hip  Dr. 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   She is undergoing PT at his office.  She has a RX for [employer] but she has not been using it.    She is past due for labs,  Screening mm  She has been out of her lipitor and BP meds     Allergies  Allergen Reactions  . Codeine Rash  . Levofloxacin Itching and Swelling    Lip swelling  . Rice Shortness Of Breath and Rash   Past Medical History  Diagnosis Date  . Shingles 04/01/2012    "have them q year; usually in Nov; for the last 20 years"  . Asthma   . Hyperlipidemia   . Vitamin D deficiency   . Allergy   . Itching   . Keloid     "q where; I have keloid skin"  . Menopause   . Right leg DVT 1980's  . Shortness of breath 04/01/2012    "when my asthma acts up"  . Migraines 04/01/2012  . Stroke 1977; 04/01/2012    denies residual; "from BCP RX"; little weak right arm; right face little numb"  . Arthritis     "ankles; right hip"   Past Surgical History  Procedure Laterality Date  . Cesarean section  1974, 1977, 1979  . Abdominal hysterectomy  1980's  . Varicose vein surgery  1970's    bilaterally  . Finger surgery     History   Social History  . Marital Status: Divorced    Spouse Name: Anita Benton    Number of Children: Anita Benton  . Years of Education: Anita Benton   Occupational History  . Not on file.   Social History Main Topics  . Smoking status: Never Smoker   . Smokeless tobacco: Never Used  . Alcohol Use: No  . Drug Use: No  . Sexual Activity: Yes    Birth Control/ Protection: Surgical   Other Topics Concern  . Not on file    Social History Narrative  . No narrative on file   Family History  Problem Relation Age of Onset  . Cancer Mother     lung  . Hyperlipidemia Mother   . Hypertension Mother   . Sudden death Neg Hx   . Diabetes Neg Hx   . Heart attack Neg Hx    Patient Active Problem List   Diagnosis Date Noted  . Edema 04/29/2013  . Foot swelling 10/21/2012  . Low back pain 08/09/2012  . URI, acute 08/05/2012  . Cough 08/05/2012  . Left hip pain 08/05/2012  . Zoster 07/02/2012  . Right hip pain 05/23/2012  . Herniated disc, cervical 04/09/2012  . Headache, hemiplegic migraine 04/04/2012  . Gait abnormality 04/03/2012  . HTN (hypertension) 04/01/2012  . Finger pain, right 04/01/2012  . Finger, open wounds, complicated 21/19/4174  . Varicose veins 06/27/2011  . Urinary tract infection, site not specified 06/27/2011  . Nevus 06/27/2011  . DVT, lower extremity 06/13/2011  . Arthritis   .  Asthma in adult   . Hyperlipidemia   . Shingles   . Vitamin d deficiency   . Allergy   . Keloid   . Menopause    Current Outpatient Prescriptions on File Prior to Visit  Medication Sig Dispense Refill  . diphenhydrAMINE (SOMINEX) 25 MG tablet Take 50 mg by mouth at bedtime as needed. For itching.      Marland Kitchen albuterol (PROVENTIL HFA;VENTOLIN HFA) 108 (90 BASE) MCG/ACT inhaler Inhale 2 puffs into the lungs every 6 (six) hours as needed. For shortness of breath or wheezing      . atorvastatin (LIPITOR) 10 MG tablet Take 1 tablet (10 mg total) by mouth daily.  90 tablet  1  . budesonide-formoterol (SYMBICORT) 160-4.5 MCG/ACT inhaler Inhale 2 puffs into the lungs 2 (two) times daily.      Marland Kitchen escitalopram (LEXAPRO) 10 MG tablet Take 1 tablet (10 mg total) by mouth daily.  30 tablet  5  . hydrochlorothiazide (HYDRODIURIL) 25 MG tablet Take 1 tablet (25 mg total) by mouth daily.  30 tablet  0  . montelukast (SINGULAIR) 10 MG tablet Take 1 tablet (10 mg total) by mouth at bedtime.  30 tablet  5  . potassium  chloride SA (K-DUR,KLOR-CON) 20 MEQ tablet Take 1 tablet (20 mEq total) by mouth daily.  30 tablet  1   No current facility-administered medications on file prior to visit.      Review of Systems    see HPI Objective:   Physical Exam Physical Exam  Nursing note and vitals reviewed.  Constitutional: She is oriented to person, place, and time. She appears well-developed and well-nourished.  HENT:  Head: Normocephalic and atraumatic.  Cardiovascular: Normal rate and regular rhythm. Exam reveals no gallop and no friction rub.  No murmur heard.  Pulmonary/Chest: Breath sounds normal. She has no wheezes. She has no rales.  Neurological: She is alert and oriented to person, place, and time.  M/S  No shortening of R leg Subjective pain with any ROM of R hip  I/E rotation  Abduction,  F/E Skin: Skin is warm and dry.  Psychiatric: She has a normal mood and affect. Her behavior is normal.        Assessment & Plan:  R hip pain     Will give Depomedrol 80 mg in office today  .  OK to take Mobic 15 mg bid which she already has.   She can follow up with Dr. Barbaraann Barthel for further evaluation and possible steroid joint injection  HTN will get all labs and reorder  HCTZ and her K  Hyperlipidemia rehceck today  rx lipitor  Will order  MM,  Keep follow up appt for CPE with me

## 2013-10-13 NOTE — Patient Instructions (Signed)
To have labs done today  Stop by xray for mammogram    Take Mobic one every 12 hours as needed  Stop by Dr. Ericka Pontiff office to scheudule follow up appt

## 2013-10-14 ENCOUNTER — Encounter: Payer: Self-pay | Admitting: *Deleted

## 2013-10-14 LAB — TSH: TSH: 2.161 u[IU]/mL (ref 0.350–4.500)

## 2013-10-14 LAB — VITAMIN D 25 HYDROXY (VIT D DEFICIENCY, FRACTURES): Vit D, 25-Hydroxy: 41 ng/mL (ref 30–89)

## 2013-10-20 ENCOUNTER — Ambulatory Visit (INDEPENDENT_AMBULATORY_CARE_PROVIDER_SITE_OTHER): Payer: BC Managed Care – PPO | Admitting: Family Medicine

## 2013-10-20 ENCOUNTER — Ambulatory Visit (HOSPITAL_BASED_OUTPATIENT_CLINIC_OR_DEPARTMENT_OTHER): Payer: Self-pay

## 2013-10-20 ENCOUNTER — Encounter: Payer: Self-pay | Admitting: Family Medicine

## 2013-10-20 VITALS — BP 115/79 | HR 77 | Ht 64.0 in | Wt 211.0 lb

## 2013-10-20 DIAGNOSIS — M545 Low back pain, unspecified: Secondary | ICD-10-CM

## 2013-10-20 MED ORDER — PREDNISONE (PAK) 10 MG PO TABS: ORAL_TABLET | ORAL | Status: DC

## 2013-10-20 MED ORDER — HYDROCODONE-ACETAMINOPHEN 5-325 MG PO TABS: 1.0000 | ORAL_TABLET | Freq: Four times a day (QID) | ORAL | Status: DC | PRN

## 2013-10-20 MED ORDER — CYCLOBENZAPRINE HCL 5 MG PO TABS: 5.0000 mg | ORAL_TABLET | Freq: Three times a day (TID) | ORAL | Status: DC | PRN

## 2013-10-20 NOTE — Patient Instructions (Signed)
While you have some elements of hip pain most of your history and exam suggest lumbar radiculopathy (a pinched nerve in your low back going into your leg). Stop the mobic for now. A prednisone dose pack is the best option for immediate relief and may be prescribed. Day after finishing prednisone restart mobic with food for pain and inflammation. Norco as needed for severe pain (no driving on this medicine). Flexeril as needed for muscle spasms (no driving on this medicine if it makes you sleepy). Stay as active as possible. Physical therapy has been shown to be helpful as well if the medicines help give you some improvement. Strengthening of low back muscles, abdominal musculature are key for long term pain relief. If not improving, will consider further imaging (x-rays, MRI). Call me the end of this week to let me know how you're doing.

## 2013-10-21 ENCOUNTER — Ambulatory Visit: Payer: Self-pay | Admitting: Internal Medicine

## 2013-10-22 ENCOUNTER — Encounter: Payer: Self-pay | Admitting: Family Medicine

## 2013-10-22 NOTE — Assessment & Plan Note (Signed)
Some elements of hip pain(pain with logroll though this is posterior) but most history, symptoms suggest radiculopathy as primary cause.  Start with prednisone dose pack - longer course usually more helpful than IM injection.  Restart mobic after this.  Norco, flexeril as needed.   Consider physical therapy, imaging depending on her improvement.

## 2013-10-22 NOTE — Progress Notes (Signed)
Patient ID: Anita Benton, female   DOB: 17-Oct-1955, 58 y.o.   MRN: [id number]  Subjective:    Patient ID: Oren Binet, female    DOB: [birth year], 58 y.o.   MRN: [id number]  PCP: Dr. Coralyn Mark  Hip Pain    58 yo F here for f/u low back pain.  1/10: Patient reports she's had about 5 months of low back pain. Previously had symptoms going into her right leg and right-sided TIA. For about 2 months left sided low back pain has come on and worsened along with radiation now into left leg. Able to work through this but becomes fairly severe by 2pm. Takes vicodin, aleve as needed for pain. No numbness or tingling. No bowel/bladder dysfunction.  2/10: Patient feels much better even after a few PT visits. She had an infection of her finger and also having tooth pain (due to have surgery on this) so had to put PT on hold for a couple weeks. Doing home exercises. Lower back will have some pain after work. Finished prednisone. Takes norco occasionally if having a bad night. Done with flexeril. Walked yesterday for exercise for first time in a while. Still some radiation into left leg but less of this. No bowel/bladder dysfunction. No numbness/tingling.  10/21/12: Patient is pleased with progress from back pain. Continues with home exercise program. Right hip is bothering her more now but improves with aleve so would like to wait on intervention for this. Does not take any other medications for pain. No radiation of pain into legs now. No bowel/bladder dysfunction. No numbness/tingling. Completed PT. Also noted an area of swelling on inside part of right foot - worse at end of the day and painful.  10/20/13: Patient returns stating she had done well up until about 3 months ago. Pain in posterolateral right hip with some numbness down to shin area. Given an IM injection on Monday without much relief (depomedrol). No swelling. Feels like her right leg gives out. Taking  mobic. No bowel/bladder dysfunction. No groin pain.  Past Medical History  Diagnosis Date  . Shingles 04/01/2012    "have them q year; usually in Nov; for the last 20 years"  . Asthma   . Hyperlipidemia   . Vitamin D deficiency   . Allergy   . Itching   . Keloid     "q where; I have keloid skin"  . Menopause   . Right leg DVT 1980's  . Shortness of breath 04/01/2012    "when my asthma acts up"  . Migraines 04/01/2012  . Stroke 1977; 04/01/2012    denies residual; "from BCP RX"; little weak right arm; right face little numb"  . Arthritis     "ankles; right hip"    Current Outpatient Prescriptions on File Prior to Visit  Medication Sig Dispense Refill  . albuterol (PROVENTIL HFA;VENTOLIN HFA) 108 (90 BASE) MCG/ACT inhaler Inhale 2 puffs into the lungs every 6 (six) hours as needed. For shortness of breath or wheezing      . atorvastatin (LIPITOR) 10 MG tablet Take 1 tablet (10 mg total) by mouth daily.  90 tablet  0  . budesonide-formoterol (SYMBICORT) 160-4.5 MCG/ACT inhaler Inhale 2 puffs into the lungs 2 (two) times daily.      . diphenhydrAMINE (SOMINEX) 25 MG tablet Take 50 mg by mouth at bedtime as needed. For itching.      . escitalopram (LEXAPRO) 10 MG tablet Take 1 tablet (10 mg total) by mouth daily.  30 tablet  5  . Fish Oil-Cholecalciferol (FISH OIL + D3 PO) Take 1 tablet by mouth daily.      . hydrochlorothiazide (HYDRODIURIL) 25 MG tablet Take 1 tablet (25 mg total) by mouth daily.  30 tablet  2  . meloxicam (MOBIC) 15 MG tablet Take 15 mg by mouth daily.      . montelukast (SINGULAIR) 10 MG tablet Take 1 tablet (10 mg total) by mouth at bedtime.  30 tablet  5  . MULTIPLE VITAMIN PO Take 1 tablet by mouth.      . potassium chloride SA (K-DUR,KLOR-CON) 20 MEQ tablet Take 1 tablet (20 mEq total) by mouth daily.  30 tablet  2   No current facility-administered medications on file prior to visit.    Past Surgical History  Procedure Laterality Date  . Cesarean section   1974, 1977, 1979  . Abdominal hysterectomy  1980's  . Varicose vein surgery  1970's    bilaterally  . Finger surgery      Allergies  Allergen Reactions  . Codeine Rash  . Levofloxacin Itching and Swelling    Lip swelling  . Rice Shortness Of Breath and Rash    History   Social History  . Marital Status: Divorced    Spouse Name: N/A    Number of Children: N/A  . Years of Education: N/A   Occupational History  . Not on file.   Social History Main Topics  . Smoking status: Never Smoker   . Smokeless tobacco: Never Used  . Alcohol Use: No  . Drug Use: No  . Sexual Activity: Yes    Birth Control/ Protection: Surgical   Other Topics Concern  . Not on file   Social History Narrative  . No narrative on file    Family History  Problem Relation Age of Onset  . Cancer Mother     lung  . Hyperlipidemia Mother   . Hypertension Mother   . Sudden death Neg Hx   . Diabetes Neg Hx   . Heart attack Neg Hx     BP 115/79  Pulse 77  Ht 5\' 4"  (1.626 m)  Wt 211 lb (95.709 kg)  BMI 36.20 kg/m2  Review of Systems See HPI above.    Objective:   Physical Exam Gen: NAD  Back/R hip: No gross deformity, scoliosis. Mild TTP right paraspinal region, buttock.  No midline or bony TTP. FROM without pain. Strength LEs 5/5 all muscle groups.   3+ MSRs in patellar and achilles tendons, equal bilaterally. Mild pain with right SLR. Sensation intact to light touch bilaterally. No groin pain with logroll - posterior right hip/buttock.  Negative left.     Assessment & Plan:  1. Low back/right leg pain - Some elements of hip pain(pain with logroll though this is posterior) but most history, symptoms suggest radiculopathy as primary cause.  Start with prednisone dose pack - longer course usually more helpful than IM injection.  Restart mobic after this.  Norco, flexeril as needed.   Consider physical therapy, imaging depending on her improvement.

## 2013-10-24 ENCOUNTER — Telehealth: Payer: Self-pay | Admitting: Family Medicine

## 2013-10-24 DIAGNOSIS — M545 Low back pain, unspecified: Secondary | ICD-10-CM

## 2013-10-27 NOTE — Telephone Encounter (Signed)
Ok we will go ahead with lumbar spine x-rays then MRI to assess for disc herniation.  Please ask her to come in at her convenience for x-rays.  Once we have those, I'll send Anita Benton a message to arrange MRI.  Thanks!

## 2013-12-04 ENCOUNTER — Encounter: Payer: Self-pay | Admitting: Internal Medicine

## 2013-12-04 ENCOUNTER — Ambulatory Visit (INDEPENDENT_AMBULATORY_CARE_PROVIDER_SITE_OTHER): Payer: BC Managed Care – PPO | Admitting: Internal Medicine

## 2013-12-04 ENCOUNTER — Telehealth: Payer: Self-pay | Admitting: *Deleted

## 2013-12-04 VITALS — BP 122/86 | HR 85 | Temp 98.0°F | Resp 18 | Wt 206.0 lb

## 2013-12-04 DIAGNOSIS — J329 Chronic sinusitis, unspecified: Secondary | ICD-10-CM

## 2013-12-04 DIAGNOSIS — R05 Cough: Secondary | ICD-10-CM

## 2013-12-04 DIAGNOSIS — R059 Cough, unspecified: Secondary | ICD-10-CM

## 2013-12-04 MED ORDER — DOXYCYCLINE HYCLATE 100 MG PO TABS: 100.0000 mg | ORAL_TABLET | Freq: Two times a day (BID) | ORAL | Status: DC

## 2013-12-04 MED ORDER — BENZONATATE 100 MG PO CAPS: 100.0000 mg | ORAL_CAPSULE | Freq: Two times a day (BID) | ORAL | Status: DC | PRN

## 2013-12-04 NOTE — Telephone Encounter (Signed)
Work in appt made.

## 2013-12-04 NOTE — Progress Notes (Signed)
Subjective:    Patient ID: Anita Benton, female    DOB: 1955/08/30, 58 y.o.   MRN: [id number]  HPI Anita Benton is here for acute visit.  4 days sinus pain, nasal congestion  Green nasal discharge  Dry cough.  No fever no chest pain no SOB  Lots of facial maxillary pain  Allergies  Allergen Reactions  . Codeine Rash  . Levofloxacin Itching and Swelling    Lip swelling  . Rice Shortness Of Breath and Rash   Past Medical History  Diagnosis Date  . Shingles 04/01/2012    "have them q year; usually in Nov; for the last 20 years"  . Asthma   . Hyperlipidemia   . Vitamin D deficiency   . Allergy   . Itching   . Keloid     "q where; I have keloid skin"  . Menopause   . Right leg DVT 1980's  . Shortness of breath 04/01/2012    "when my asthma acts up"  . Migraines 04/01/2012  . Stroke 1977; 04/01/2012    denies residual; "from BCP RX"; little weak right arm; right face little numb"  . Arthritis     "ankles; right hip"   Past Surgical History  Procedure Laterality Date  . Cesarean section  1974, 1977, 1979  . Abdominal hysterectomy  1980's  . Varicose vein surgery  1970's    bilaterally  . Finger surgery     History   Social History  . Marital Status: Divorced    Spouse Name: N/A    Number of Children: N/A  . Years of Education: N/A   Occupational History  . Not on file.   Social History Main Topics  . Smoking status: Never Smoker   . Smokeless tobacco: Never Used  . Alcohol Use: No  . Drug Use: No  . Sexual Activity: Yes    Birth Control/ Protection: Surgical   Other Topics Concern  . Not on file   Social History Narrative  . No narrative on file   Family History  Problem Relation Age of Onset  . Cancer Mother     lung  . Hyperlipidemia Mother   . Hypertension Mother   . Sudden death Neg Hx   . Diabetes Neg Hx   . Heart attack Neg Hx    Patient Active Problem List   Diagnosis Date Noted  . Edema 04/29/2013  . Foot swelling 10/21/2012  . Low back  pain 08/09/2012  . URI, acute 08/05/2012  . Cough 08/05/2012  . Left hip pain 08/05/2012  . Zoster 07/02/2012  . Right hip pain 05/23/2012  . Herniated disc, cervical 04/09/2012  . Headache, hemiplegic migraine 04/04/2012  . Gait abnormality 04/03/2012  . HTN (hypertension) 04/01/2012  . Finger pain, right 04/01/2012  . Finger, open wounds, complicated 78/29/5621  . Varicose veins 06/27/2011  . Urinary tract infection, site not specified 06/27/2011  . Nevus 06/27/2011  . DVT, lower extremity 06/13/2011  . Arthritis   . Asthma in adult   . Hyperlipidemia   . Shingles   . Vitamin d deficiency   . Allergy   . Keloid   . Menopause    Current Outpatient Prescriptions on File Prior to Visit  Medication Sig Dispense Refill  . albuterol (PROVENTIL HFA;VENTOLIN HFA) 108 (90 BASE) MCG/ACT inhaler Inhale 2 puffs into the lungs every 6 (six) hours as needed. For shortness of breath or wheezing      . atorvastatin (LIPITOR) 10 MG tablet Take  1 tablet (10 mg total) by mouth daily.  90 tablet  0  . budesonide-formoterol (SYMBICORT) 160-4.5 MCG/ACT inhaler Inhale 2 puffs into the lungs 2 (two) times daily.      . cyclobenzaprine (FLEXERIL) 5 MG tablet Take 1 tablet (5 mg total) by mouth every 8 (eight) hours as needed for muscle spasms.  60 tablet  1  . diphenhydrAMINE (SOMINEX) 25 MG tablet Take 50 mg by mouth at bedtime as needed. For itching.      . escitalopram (LEXAPRO) 10 MG tablet Take 1 tablet (10 mg total) by mouth daily.  30 tablet  5  . Fish Oil-Cholecalciferol (FISH OIL + D3 PO) Take 1 tablet by mouth daily.      . hydrochlorothiazide (HYDRODIURIL) 25 MG tablet Take 1 tablet (25 mg total) by mouth daily.  30 tablet  2  . HYDROcodone-acetaminophen (NORCO) 5-325 MG per tablet Take 1 tablet by mouth every 6 (six) hours as needed for moderate pain.  60 tablet  0  . meloxicam (MOBIC) 15 MG tablet Take 15 mg by mouth daily.      . montelukast (SINGULAIR) 10 MG tablet Take 1 tablet (10 mg  total) by mouth at bedtime.  30 tablet  5  . MULTIPLE VITAMIN PO Take 1 tablet by mouth.      . potassium chloride SA (K-DUR,KLOR-CON) 20 MEQ tablet Take 1 tablet (20 mEq total) by mouth daily.  30 tablet  2  . predniSONE (STERAPRED UNI-PAK) 10 MG tablet 6 tabs po day 1, 5 tabs po day 2, 4 tabs po day 3, 3 tabs po day 4, 2 tabs po day 5, 1 tab po day 6  21 tablet  0   No current facility-administered medications on file prior to visit.      Review of Systems See HPI    Objective:   Physical Exam Physical Exam  Constitutional: She is oriented to person, place, and time. She appears well-developed and well-nourished. She is cooperative.  HENT:  Head: Normocephalic and atraumatic.  Right Ear: A middle ear effusion is present.  Left Ear: A middle ear effusion is present.  Nose: Mucosal edema present. Right sinus exhibits maxillary sinus tenderness. Left sinus exhibits maxillary sinus tenderness.  Mouth/Throat: Posterior oropharyngeal erythema present.  Serous effusion bilaterally  Eyes: Conjunctivae and EOM are normal. Pupils are equal, round, and reactive to light.  Neck: Neck supple. Carotid bruit is not present. No mass present.  Cardiovascular: Regular rhythm, normal heart sounds, intact distal pulses and normal pulses. Exam reveals no gallop and no friction rub.  No murmur heard.  Pulmonary/Chest: Breath sounds normal. She has no wheezes. She has no rhonchi. She has no rales.  Neurological: She is alert and oriented to person, place, and time.  Skin: Skin is warm and dry. No abrasion, no bruising, no ecchymosis and no rash noted. No cyanosis. Nails show no clubbing.  Psychiatric: She has a normal mood and affect. Her speech is normal and behavior is normal.             Assessment & Plan:  Sinusitis   Will give  Doxycycline   Cough Tessalon perles   See me if not better

## 2013-12-15 ENCOUNTER — Encounter: Payer: Self-pay | Admitting: Internal Medicine

## 2014-02-09 ENCOUNTER — Ambulatory Visit (INDEPENDENT_AMBULATORY_CARE_PROVIDER_SITE_OTHER): Payer: Self-pay | Admitting: Internal Medicine

## 2014-02-09 ENCOUNTER — Encounter: Payer: Self-pay | Admitting: Internal Medicine

## 2014-02-09 VITALS — BP 123/75 | HR 92 | Resp 16 | Ht 64.0 in | Wt 205.0 lb

## 2014-02-09 DIAGNOSIS — B029 Zoster without complications: Secondary | ICD-10-CM

## 2014-02-09 DIAGNOSIS — Z23 Encounter for immunization: Secondary | ICD-10-CM

## 2014-02-09 DIAGNOSIS — Z139 Encounter for screening, unspecified: Secondary | ICD-10-CM

## 2014-02-09 DIAGNOSIS — R519 Headache, unspecified: Secondary | ICD-10-CM

## 2014-02-09 DIAGNOSIS — R51 Headache: Secondary | ICD-10-CM

## 2014-02-09 MED ORDER — VALACYCLOVIR HCL 1 G PO TABS: ORAL_TABLET | ORAL | Status: DC

## 2014-02-09 NOTE — Patient Instructions (Signed)
Schedule 30 min CPE within 4 weeks   Advise patient to get documentation of her tetanus vaccine and bring with her for next visit  Go to lab today

## 2014-02-09 NOTE — Progress Notes (Signed)
Subjective:    Patient ID: Anita Benton, female    DOB: [birth year], 58 y.o.   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   She will need hepatitis B  Series.  She denies a positive PPD in past.  Had Td with finger injury.  Allergies  Allergen Reactions  . Codeine Rash  . Levofloxacin Itching and Swelling    Lip swelling  . Rice Shortness Of Breath and Rash   Past Medical History  Diagnosis Date  . Shingles 04/01/2012    "have them q year; usually in Nov; for the last 20 years"  . Asthma   . Hyperlipidemia   . Vitamin D deficiency   . Allergy   . Itching   . Keloid     "q where; I have keloid skin"  . Menopause   . Right leg DVT 1980's  . Shortness of breath 04/01/2012    "when my asthma acts up"  . Migraines 04/01/2012  . Stroke 1977; 04/01/2012    denies residual; "from BCP RX"; little weak right arm; right face little numb"  . Arthritis     "ankles; right hip"   Past Surgical History  Procedure Laterality Date  . Cesarean section  1974, 1977, 1979  . Abdominal hysterectomy  1980's  . Varicose vein surgery  1970's    bilaterally  . Finger surgery     History   Social History  . Marital Status: Divorced    Spouse Name: N/A    Number of Children: N/A  . Years of Education: N/A   Occupational History  . Not on file.   Social History Main Topics  . Smoking status: Never Smoker   . Smokeless tobacco: Never Used  . Alcohol Use: No  . Drug Use: No  . Sexual Activity: Yes    Birth Control/ Protection: Surgical   Other Topics Concern  . Not on file   Social History Narrative  . No narrative on file   Family History  Problem Relation Age of Onset  . Cancer Mother     lung  . Hyperlipidemia Mother   . Hypertension Mother   . Sudden death Neg  Hx   . Diabetes Neg Hx   . Heart attack Neg Hx    Patient Active Problem List   Diagnosis Date Noted  . Edema 04/29/2013  . Foot swelling 10/21/2012  . Low back pain 08/09/2012  . URI, acute 08/05/2012  . Cough 08/05/2012  . Left hip pain 08/05/2012  . Zoster 07/02/2012  . Right hip pain 05/23/2012  . Herniated disc, cervical 04/09/2012  . Headache, hemiplegic migraine 04/04/2012  . Gait abnormality 04/03/2012  . HTN (hypertension) 04/01/2012  . Finger pain, right 04/01/2012  . Finger, open wounds, complicated 40/98/1191  . Varicose veins 06/27/2011  . Urinary tract infection, site not specified 06/27/2011  . Nevus 06/27/2011  . DVT, lower extremity 06/13/2011  . Arthritis   . Asthma in adult   . Hyperlipidemia   . Shingles   . Vitamin d deficiency   . Allergy   . Keloid   . Menopause    Current Outpatient Prescriptions on File Prior to Visit  Medication Sig Dispense Refill  .  albuterol (PROVENTIL HFA;VENTOLIN HFA) 108 (90 BASE) MCG/ACT inhaler Inhale 2 puffs into the lungs every 6 (six) hours as needed. For shortness of breath or wheezing      . atorvastatin (LIPITOR) 10 MG tablet Take 1 tablet (10 mg total) by mouth daily.  90 tablet  0  . cyclobenzaprine (FLEXERIL) 5 MG tablet Take 1 tablet (5 mg total) by mouth every 8 (eight) hours as needed for muscle spasms.  60 tablet  1  . escitalopram (LEXAPRO) 10 MG tablet Take 1 tablet (10 mg total) by mouth daily.  30 tablet  5  . Fish Oil-Cholecalciferol (FISH OIL + D3 PO) Take 1 tablet by mouth daily.      . hydrochlorothiazide (HYDRODIURIL) 25 MG tablet Take 1 tablet (25 mg total) by mouth daily.  30 tablet  2  . montelukast (SINGULAIR) 10 MG tablet Take 1 tablet (10 mg total) by mouth at bedtime.  30 tablet  5  . MULTIPLE VITAMIN PO Take 1 tablet by mouth.      . potassium chloride SA (K-DUR,KLOR-CON) 20 MEQ tablet Take 1 tablet (20 mEq total) by mouth daily.  30 tablet  2  . budesonide-formoterol (SYMBICORT) 160-4.5  MCG/ACT inhaler Inhale 2 puffs into the lungs 2 (two) times daily.       No current facility-administered medications on file prior to visit.      Review of Systems See HPI    Objective:   Physical Exam  Physical Exam  Nursing note and vitals reviewed.  Constitutional: She is oriented to person, place, and time. She appears well-developed and well-nourished.  HENT:  Head: Normocephalic and atraumatic.  Cardiovascular: Normal rate and regular rhythm. Exam reveals no gallop and no friction rub.  No murmur heard.  Pulmonary/Chest: Breath sounds normal. She has no wheezes. She has no rales.  Neurological: She is alert and oriented to person, place, and time.  Skin: Skin is warm and dry.  Small grouped vesicular lesion on forehead.  Not near ears or eyes Psychiatric: She has a normal mood and affect. Her behavior is normal.         Assessment & Plan:  Zoster:  OK for Valtrex 1 gm bid for 10 days  OK to give Hep B #1 today,  Check MMR titers and quantiferon.   See me in 4 weeks for CPE

## 2014-02-10 LAB — MEASLES/MUMPS/RUBELLA IMMUNITY
Mumps IgG: 131 AU/mL — ABNORMAL HIGH (ref <9.00)
Rubella: 3.78 Index — ABNORMAL HIGH (ref <0.90)
Rubeola IgG: >300 AU/mL — ABNORMAL HIGH (ref <25.00)

## 2014-02-11 ENCOUNTER — Encounter: Payer: Self-pay | Admitting: *Deleted

## 2014-02-11 LAB — QUANTIFERON TB GOLD ASSAY (BLOOD)
INTERFERON GAMMA RELEASE ASSAY: NEGATIVE
Mitogen value: >10 IU/mL
QUANTIFERON NIL VALUE: 0.03 [IU]/mL
QUANTIFERON TB AG MINUS NIL: 0 [IU]/mL
TB AG VALUE: 0.03 [IU]/mL

## 2014-02-12 ENCOUNTER — Other Ambulatory Visit: Payer: Self-pay | Admitting: *Deleted

## 2014-02-12 ENCOUNTER — Telehealth: Payer: Self-pay | Admitting: *Deleted

## 2014-02-12 MED ORDER — ALBUTEROL SULFATE HFA 108 (90 BASE) MCG/ACT IN AERS: 2.0000 | INHALATION_SPRAY | Freq: Four times a day (QID) | RESPIRATORY_TRACT | Status: DC | PRN

## 2014-02-12 MED ORDER — BUDESONIDE-FORMOTEROL FUMARATE 160-4.5 MCG/ACT IN AERO: 2.0000 | INHALATION_SPRAY | Freq: Two times a day (BID) | RESPIRATORY_TRACT | Status: DC

## 2014-02-12 NOTE — Telephone Encounter (Signed)
Needs refill of Symbicort & Albuterol inhalers to [facility].

## 2014-02-13 NOTE — Telephone Encounter (Signed)
Refills sent 7/16

## 2014-02-24 ENCOUNTER — Encounter: Payer: Self-pay | Admitting: Internal Medicine

## 2014-02-24 ENCOUNTER — Ambulatory Visit (INDEPENDENT_AMBULATORY_CARE_PROVIDER_SITE_OTHER): Payer: Self-pay | Admitting: Internal Medicine

## 2014-02-24 VITALS — BP 133/80 | HR 102 | Temp 97.1°F | Ht 64.0 in | Wt 205.0 lb

## 2014-02-24 DIAGNOSIS — I10 Essential (primary) hypertension: Secondary | ICD-10-CM

## 2014-02-24 NOTE — Patient Instructions (Addendum)
To call office 8/13 to schedule appt with nurse visit for second hepatitis vaccine  See me as needed

## 2014-02-24 NOTE — Progress Notes (Signed)
Subjective:    Patient ID: Anita Benton, female    DOB: 24-May-1956, 58 y.o.   MRN: [id number]  HPI  Anita Benton is here for follow up of HTN.    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  She had a tetanus vaccine in 2012 when Anita Benton hurt her finger.        She tells me there is no problem with lifting or carrying.  Hyperlipidemia  She is without insurance now and cannot start any new meds  Allergies  Allergen Reactions  . Codeine Rash  . Levofloxacin Itching and Swelling    Lip swelling  . Rice Shortness Of Breath and Rash   Past Medical History  Diagnosis Date  . Shingles 04/01/2012    "have them q year; usually in Nov; for the last 20 years"  . Asthma   . Hyperlipidemia   . Vitamin D deficiency   . Allergy   . Itching   . Keloid     "q where; I have keloid skin"  . Menopause   . Right leg DVT 1980's  . Shortness of breath 04/01/2012    "when my asthma acts up"  . Migraines 04/01/2012  . Stroke 1977; 04/01/2012    denies residual; "from BCP RX"; little weak right arm; right face little numb"  . Arthritis     "ankles; right hip"   Past Surgical History  Procedure Laterality Date  . Cesarean section  1974, 1977, 1979  . Abdominal hysterectomy  1980's  . Varicose vein surgery  1970's    bilaterally  . Finger surgery     History   Social History  . Marital Status: Divorced    Spouse Name: N/A    Number of Children: N/A  . Years of Education: N/A   Occupational History  . Not on file.   Social History Main Topics  . Smoking status: Never Smoker   . Smokeless tobacco: Never Used  . Alcohol Use: No  . Drug Use: No  . Sexual Activity: Yes    Birth Control/ Protection: Surgical   Other Topics Concern  . Not on file   Social History Narrative  . No narrative on file   Family History  Problem Relation Age of Onset  . Cancer Mother     lung  . Hyperlipidemia Mother   . Hypertension Mother   . Sudden death Neg Hx   . Diabetes Neg Hx   . Heart attack  Neg Hx    Patient Active Problem List   Diagnosis Date Noted  . Edema 04/29/2013  . Foot swelling 10/21/2012  . Low back pain 08/09/2012  . URI, acute 08/05/2012  . Cough 08/05/2012  . Left hip pain 08/05/2012  . Zoster 07/02/2012  . Right hip pain 05/23/2012  . Herniated disc, cervical 04/09/2012  . Headache, hemiplegic migraine 04/04/2012  . Gait abnormality 04/03/2012  . HTN (hypertension) 04/01/2012  . Finger pain, right 04/01/2012  . Finger, open wounds, complicated 05/24/8526  . Varicose veins 06/27/2011  . Urinary tract infection, site not specified 06/27/2011  . Nevus 06/27/2011  . DVT, lower extremity 06/13/2011  . Arthritis   . Asthma in adult   . Hyperlipidemia   . Shingles   . Vitamin d deficiency   . Allergy   . Keloid   . Menopause    Current Outpatient Prescriptions on File Prior to Visit  Medication Sig Dispense Refill  . albuterol (PROVENTIL HFA;VENTOLIN HFA) 108 (90 BASE) MCG/ACT  inhaler Inhale 2 puffs into the lungs every 6 (six) hours as needed. For shortness of breath or wheezing  1 Inhaler  1  . atorvastatin (LIPITOR) 10 MG tablet Take 1 tablet (10 mg total) by mouth daily.  90 tablet  0  . budesonide-formoterol (SYMBICORT) 160-4.5 MCG/ACT inhaler Inhale 2 puffs into the lungs 2 (two) times daily.  1 Inhaler  1  . cyclobenzaprine (FLEXERIL) 5 MG tablet Take 1 tablet (5 mg total) by mouth every 8 (eight) hours as needed for muscle spasms.  60 tablet  1  . escitalopram (LEXAPRO) 10 MG tablet Take 1 tablet (10 mg total) by mouth daily.  30 tablet  5  . Fish Oil-Cholecalciferol (FISH OIL + D3 PO) Take 1 tablet by mouth daily.      . hydrochlorothiazide (HYDRODIURIL) 25 MG tablet Take 1 tablet (25 mg total) by mouth daily.  30 tablet  2  . montelukast (SINGULAIR) 10 MG tablet Take 1 tablet (10 mg total) by mouth at bedtime.  30 tablet  5  . MULTIPLE VITAMIN PO Take 1 tablet by mouth.      . potassium chloride SA (K-DUR,KLOR-CON) 20 MEQ tablet Take 1 tablet  (20 mEq total) by mouth daily.  30 tablet  2  . valACYclovir (VALTREX) 1000 MG tablet Take one bid for 10 days  20 tablet  0   No current facility-administered medications on file prior to visit.      Review of Systems See HPI     Objective:   Physical Exam   Physical Exam  Nursing note and vitals reviewed.  [facility]  She tells me that she just received new glasses Constitutional: She is oriented to person, place, and time. She appears well-developed and well-nourished.  HENT:  Head: Normocephalic and atraumatic.  Cardiovascular: Normal rate and regular rhythm. Exam reveals no gallop and no friction rub.  No murmur heard.  Pulmonary/Chest: Breath sounds normal. She has no wheezes. She has no rales.  Neurological: She is alert and oriented to person, place, and time.  Skin: Skin is warm and dry.  Psychiatric: She has a normal mood and affect. Her behavior is normal.        Assessment & Plan:  HTN  Well controlled continue medications  Hyperlipidemia:  Benton constraints to medication. Pt formerly a pt at [facility].   She states she will return there temporarily if necessary  Screening vaccine  :  Advised to call office on 8/13 to schedule  Her second  Hepatitis vaccine.

## 2014-02-24 NOTE — ED Notes (Signed)
Patient came and requested a copy of her last tdap vaccination.

## 2014-03-02 ENCOUNTER — Ambulatory Visit: Payer: Self-pay | Admitting: Internal Medicine

## 2014-06-01 ENCOUNTER — Encounter: Payer: Self-pay | Admitting: Internal Medicine

## 2014-09-29 LAB — HM MAMMOGRAPHY: HM Mammogram: NORMAL

## 2014-10-09 HISTORY — PX: FINGER SURGERY: SHX640

## 2015-01-27 ENCOUNTER — Ambulatory Visit: Payer: Self-pay | Admitting: Adult Health

## 2015-02-03 ENCOUNTER — Encounter (HOSPITAL_BASED_OUTPATIENT_CLINIC_OR_DEPARTMENT_OTHER): Payer: Self-pay | Admitting: Emergency Medicine

## 2015-02-03 ENCOUNTER — Emergency Department (HOSPITAL_BASED_OUTPATIENT_CLINIC_OR_DEPARTMENT_OTHER): Payer: 59

## 2015-02-03 ENCOUNTER — Emergency Department (HOSPITAL_BASED_OUTPATIENT_CLINIC_OR_DEPARTMENT_OTHER)
Admission: EM | Admit: 2015-02-03 | Discharge: 2015-02-04 | Disposition: A | Discharge status: Home / Self Care | Payer: 59 | Attending: Emergency Medicine | Admitting: Emergency Medicine

## 2015-02-03 DIAGNOSIS — Z8619 Personal history of other infectious and parasitic diseases: Secondary | ICD-10-CM | POA: Diagnosis not present

## 2015-02-03 DIAGNOSIS — Z79899 Other long term (current) drug therapy: Secondary | ICD-10-CM | POA: Diagnosis not present

## 2015-02-03 DIAGNOSIS — R05 Cough: Secondary | ICD-10-CM

## 2015-02-03 DIAGNOSIS — M1990 Unspecified osteoarthritis, unspecified site: Secondary | ICD-10-CM | POA: Diagnosis not present

## 2015-02-03 DIAGNOSIS — Z86718 Personal history of other venous thrombosis and embolism: Secondary | ICD-10-CM | POA: Insufficient documentation

## 2015-02-03 DIAGNOSIS — J9801 Acute bronchospasm: Secondary | ICD-10-CM

## 2015-02-03 DIAGNOSIS — Z8673 Personal history of transient ischemic attack (TIA), and cerebral infarction without residual deficits: Secondary | ICD-10-CM | POA: Diagnosis not present

## 2015-02-03 DIAGNOSIS — R059 Cough, unspecified: Secondary | ICD-10-CM

## 2015-02-03 DIAGNOSIS — Z7951 Long term (current) use of inhaled steroids: Secondary | ICD-10-CM | POA: Diagnosis not present

## 2015-02-03 DIAGNOSIS — E785 Hyperlipidemia, unspecified: Secondary | ICD-10-CM | POA: Insufficient documentation

## 2015-02-03 DIAGNOSIS — Z780 Asymptomatic menopausal state: Secondary | ICD-10-CM | POA: Insufficient documentation

## 2015-02-03 DIAGNOSIS — Z872 Personal history of diseases of the skin and subcutaneous tissue: Secondary | ICD-10-CM | POA: Diagnosis not present

## 2015-02-03 DIAGNOSIS — G43909 Migraine, unspecified, not intractable, without status migrainosus: Secondary | ICD-10-CM | POA: Insufficient documentation

## 2015-02-03 DIAGNOSIS — J45901 Unspecified asthma with (acute) exacerbation: Secondary | ICD-10-CM | POA: Insufficient documentation

## 2015-02-03 DIAGNOSIS — R0602 Shortness of breath: Secondary | ICD-10-CM | POA: Diagnosis present

## 2015-02-03 MED ORDER — PREDNISONE 50 MG PO TABS
60.0000 mg | ORAL_TABLET | Freq: Once | ORAL | Status: AC
  Administered 2015-02-03: 60 mg via ORAL
  Filled 2015-02-03 (×2): qty 1

## 2015-02-03 MED ORDER — ALBUTEROL SULFATE (2.5 MG/3ML) 0.083% IN NEBU
5.0000 mg | INHALATION_SOLUTION | Freq: Once | RESPIRATORY_TRACT | Status: AC
Start: 2015-02-03 — End: 2015-02-03
  Administered 2015-02-03: 5 mg via RESPIRATORY_TRACT
  Filled 2015-02-03: qty 6

## 2015-02-03 MED ORDER — HYDROCODONE-ACETAMINOPHEN 7.5-325 MG/15ML PO SOLN: 10.0000 mL | Freq: Four times a day (QID) | ORAL | Status: AC | PRN

## 2015-02-03 MED ORDER — ALBUTEROL (5 MG/ML) CONTINUOUS INHALATION SOLN
10.0000 mg/h | INHALATION_SOLUTION | Freq: Once | RESPIRATORY_TRACT | Status: AC
  Administered 2015-02-03: 10 mg/h via RESPIRATORY_TRACT
  Filled 2015-02-03: qty 20

## 2015-02-03 MED ORDER — HYDROCODONE-ACETAMINOPHEN 5-325 MG PO TABS
1.0000 | ORAL_TABLET | Freq: Once | ORAL | Status: AC
  Administered 2015-02-03: 1 via ORAL
  Filled 2015-02-03: qty 1

## 2015-02-03 MED ORDER — ONDANSETRON 4 MG PO TBDP
4.0000 mg | ORAL_TABLET | Freq: Once | ORAL | Status: AC
  Administered 2015-02-03: 4 mg via ORAL
  Filled 2015-02-03: qty 1

## 2015-02-03 MED ORDER — PREDNISONE 20 MG PO TABS: 20.0000 mg | ORAL_TABLET | Freq: Two times a day (BID) | ORAL | Status: DC

## 2015-02-03 NOTE — ED Notes (Signed)
SOB has used tessalon pearls and both inhalers. C/o productive cough, HA and rib pain. Pt is alert and oriented a triage.

## 2015-02-03 NOTE — ED Notes (Signed)
MD at bedside. 

## 2015-02-03 NOTE — Discharge Instructions (Signed)
Bronchospasm A bronchospasm is a spasm or tightening of the airways going into the lungs. During a bronchospasm breathing becomes more difficult because the airways get smaller. When this happens there can be coughing, a whistling sound when breathing (wheezing), and difficulty breathing. Bronchospasm is often associated with asthma, but not all patients who experience a bronchospasm have asthma. CAUSES  A bronchospasm is caused by inflammation or irritation of the airways. The inflammation or irritation may be triggered by:   Allergies (such as to animals, pollen, food, or mold). Allergens that cause bronchospasm may cause wheezing immediately after exposure or many hours later.   Infection. Viral infections are believed to be the most common cause of bronchospasm.   Exercise.   Irritants (such as pollution, cigarette smoke, strong odors, aerosol sprays, and paint fumes).   Weather changes. Winds increase molds and pollens in the air. Rain refreshes the air by washing irritants out. Cold air may cause inflammation.   Stress and emotional upset.  SIGNS AND SYMPTOMS   Wheezing.   Excessive nighttime coughing.   Frequent or severe coughing with a simple cold.   Chest tightness.   Shortness of breath.  DIAGNOSIS  Bronchospasm is usually diagnosed through a history and physical exam. Tests, such as chest X-rays, are sometimes done to look for other conditions. TREATMENT   Inhaled medicines can be given to open up your airways and help you breathe. The medicines can be given using either an inhaler or a nebulizer machine.  Corticosteroid medicines may be given for severe bronchospasm, usually when it is associated with asthma. HOME CARE INSTRUCTIONS   Always have a plan prepared for seeking medical care. Know when to call your health care provider and local emergency services (911 in the [place] Know where you can access local emergency care.  Only take medicines as  directed by your health care provider.  If you were prescribed an inhaler or nebulizer machine, ask your health care provider to explain how to use it correctly. Always use a spacer with your inhaler if you were given one.  It is necessary to remain calm during an attack. Try to relax and breathe more slowly.  Control your home environment in the following ways:   Change your heating and air conditioning filter at least once a month.   Limit your use of fireplaces and wood stoves.  Do not smoke and do not allow smoking in your home.   Avoid exposure to perfumes and fragrances.   Get rid of pests (such as roaches and mice) and their droppings.   Throw away plants if you see mold on them.   Keep your house clean and dust free.   Replace carpet with wood, tile, or vinyl flooring. Carpet can trap dander and dust.   Use allergy-proof pillows, mattress covers, and box spring covers.   Wash bed sheets and blankets every week in hot water and dry them in a dryer.   Use blankets that are made of polyester or cotton.   Wash hands frequently. SEEK MEDICAL CARE IF:   You have muscle aches.   You have chest pain.   The sputum changes from clear or white to yellow, green, gray, or bloody.   The sputum you cough up gets thicker.   There are problems that may be related to the medicine you are given, such as a rash, itching, swelling, or trouble breathing.  SEEK IMMEDIATE MEDICAL CARE IF:   You have worsening wheezing and coughing even  after taking your prescribed medicines.   You have increased difficulty breathing.   You develop severe chest pain. MAKE SURE YOU:   Understand these instructions.  Will watch your condition.  Will get help right away if you are not doing well or get worse. Document Released: 07/20/2003 Document Revised: 07/22/2013 Document Reviewed: 01/06/2013 [facility] Patient Information 2015 [place], [place]. This information is not  intended to replace advice given to you by your health care provider. Make sure you discuss any questions you have with your health care provider.  Cough, Adult  A cough is a reflex that helps clear your throat and airways. It can help heal the body or may be a reaction to an irritated airway. A cough may only last 2 or 3 weeks (acute) or may last more than 8 weeks (chronic).  CAUSES Acute cough:  Viral or bacterial infections. Chronic cough:  Infections.  Allergies.  Asthma.  Post-nasal drip.  Smoking.  Heartburn or acid reflux.  Some medicines.  Chronic lung problems (COPD).  Cancer. SYMPTOMS   Cough.  Fever.  Chest pain.  Increased breathing rate.  High-pitched whistling sound when breathing (wheezing).  Colored mucus that you cough up (sputum). TREATMENT   A bacterial cough may be treated with antibiotic medicine.  A viral cough must run its course and will not respond to antibiotics.  Your caregiver may recommend other treatments if you have a chronic cough. HOME CARE INSTRUCTIONS   Only take over-the-counter or prescription medicines for pain, discomfort, or fever as directed by your caregiver. Use cough suppressants only as directed by your caregiver.  Use a cold steam vaporizer or humidifier in your bedroom or home to help loosen secretions.  Sleep in a semi-upright position if your cough is worse at night.  Rest as needed.  Stop smoking if you smoke. SEEK IMMEDIATE MEDICAL CARE IF:   You have pus in your sputum.  Your cough starts to worsen.  You cannot control your cough with suppressants and are losing sleep.  You begin coughing up blood.  You have difficulty breathing.  You develop pain which is getting worse or is uncontrolled with medicine.  You have a fever. MAKE SURE YOU:   Understand these instructions.  Will watch your condition.  Will get help right away if you are not doing well or get worse. Document Released:  01/13/2011 Document Revised: 10/09/2011 Document Reviewed: 01/13/2011 [facility] Patient Information 2015 [place], [place]. This information is not intended to replace advice given to you by your health care provider. Make sure you discuss any questions you have with your health care provider.

## 2015-02-03 NOTE — Progress Notes (Signed)
Patient states that she is breathing better after nebulizer treatment

## 2015-02-03 NOTE — ED Provider Notes (Signed)
CSN: [id number]     Arrival date & time 02/03/15  2145 History  This chart was scribed for Tanna Furry, MD by Steva Colder, ED Scribe. The patient was seen in room MH08/MH08 at 10:37 PM.     Chief Complaint  Patient presents with  . Shortness of Breath      The history is provided by the patient. No language interpreter was used.    Anita Benton is a 59 y.o. female with a medical hx of asthma and SOB who presents to the Emergency Department complaining of SOB onset last week. Pt went to her PCP last week and today and was informed that she had pnuemonia and a sinus infection. Pt was given an abx and decongestant for her symptoms. Pt was Pt is having associated symptoms of productive cough, HA, rib pain, sinus pressure, sore throat. She reports that her cough is productive of yellow sputum. She notes that she has tried tessalon perles and inhalers with no relief of her symptoms. She denies fever and any other symptoms. Denies being a smoker. Pt is allergic to codeine and levofloxacin.   Past Medical History  Diagnosis Date  . Shingles 04/01/2012    "have them q year; usually in Nov; for the last 20 years"  . Asthma   . Hyperlipidemia   . Vitamin D deficiency   . Allergy   . Itching   . Keloid     "q where; I have keloid skin"  . Menopause   . Right leg DVT 1980's  . Shortness of breath 04/01/2012    "when my asthma acts up"  . Migraines 04/01/2012  . Stroke 1977; 04/01/2012    denies residual; "from BCP RX"; little weak right arm; right face little numb"  . Arthritis     "ankles; right hip"   Past Surgical History  Procedure Laterality Date  . Cesarean section  1974, 1977, 1979  . Abdominal hysterectomy  1980's  . Varicose vein surgery  1970's    bilaterally  . Finger surgery     Family History  Problem Relation Age of Onset  . Cancer Mother     lung  . Hyperlipidemia Mother   . Hypertension Mother   . Sudden death Neg Hx   . Diabetes Neg Hx   . Heart attack Neg Hx     History  Substance Use Topics  . Smoking status: Never Smoker   . Smokeless tobacco: Never Used  . Alcohol Use: No   OB History    Gravida Para Term Preterm AB TAB SAB Ectopic Multiple Living   4 3   1 1          Review of Systems  Constitutional: Negative for fever, chills, diaphoresis, appetite change and fatigue.  HENT: Positive for sinus pressure and sore throat. Negative for mouth sores and trouble swallowing.   Eyes: Negative for visual disturbance.  Respiratory: Positive for cough and shortness of breath. Negative for chest tightness and wheezing.   Cardiovascular: Negative for chest pain.  Gastrointestinal: Negative for nausea, vomiting, abdominal pain, diarrhea and abdominal distention.  Endocrine: Negative for polydipsia, polyphagia and polyuria.  Genitourinary: Negative for dysuria, frequency and hematuria.  Musculoskeletal: Positive for arthralgias. Negative for gait problem.  Skin: Negative for color change, pallor and rash.  Neurological: Positive for headaches. Negative for dizziness, syncope and light-headedness.  Hematological: Does not bruise/bleed easily.  Psychiatric/Behavioral: Negative for behavioral problems and confusion.      Allergies  Codeine; Levofloxacin; and  Rice  Home Medications   Prior to Admission medications   Medication Sig Start Date End Date Taking? Authorizing Provider  albuterol (PROVENTIL HFA;VENTOLIN HFA) 108 (90 BASE) MCG/ACT inhaler Inhale 2 puffs into the lungs every 6 (six) hours as needed. For shortness of breath or wheezing 02/12/14  Yes Lanice Shirts, MD  atorvastatin (LIPITOR) 10 MG tablet Take 1 tablet (10 mg total) by mouth daily. 10/13/13  Yes Lanice Shirts, MD  budesonide-formoterol (SYMBICORT) 160-4.5 MCG/ACT inhaler Inhale 2 puffs into the lungs 2 (two) times daily. 02/12/14 02/12/15 Yes Lanice Shirts, MD  Fish Oil-Cholecalciferol (FISH OIL + D3 PO) Take 1 tablet by mouth daily.   Yes Historical  Provider, MD  hydrochlorothiazide (HYDRODIURIL) 25 MG tablet Take 1 tablet (25 mg total) by mouth daily. 10/13/13  Yes Lanice Shirts, MD  montelukast (SINGULAIR) 10 MG tablet Take 1 tablet (10 mg total) by mouth at bedtime. 10/13/13  Yes Lanice Shirts, MD  MULTIPLE VITAMIN PO Take 1 tablet by mouth.   Yes Historical Provider, MD  potassium chloride SA (K-DUR,KLOR-CON) 20 MEQ tablet Take 1 tablet (20 mEq total) by mouth daily. 10/13/13  Yes Lanice Shirts, MD  cyclobenzaprine (FLEXERIL) 5 MG tablet Take 1 tablet (5 mg total) by mouth every 8 (eight) hours as needed for muscle spasms. 10/20/13   Dene Gentry, MD  escitalopram (LEXAPRO) 10 MG tablet Take 1 tablet (10 mg total) by mouth daily. 10/13/13   Lanice Shirts, MD  HYDROcodone-acetaminophen (HYCET) 7.5-325 mg/15 ml solution Take 10 mLs by mouth 4 (four) times daily as needed for moderate pain. 02/03/15 02/03/16  Tanna Furry, MD  predniSONE (DELTASONE) 20 MG tablet Take 1 tablet (20 mg total) by mouth 2 (two) times daily with a meal. 02/03/15   Tanna Furry, MD  valACYclovir (VALTREX) 1000 MG tablet Take one bid for 10 days 02/09/14   Lanice Shirts, MD   BP 122/77 mmHg  Pulse 92  Temp(Src) 98.1 F (36.7 C) (Oral)  Resp 20  Ht 5\' 4"  (1.626 m)  Wt 210 lb (95.255 kg)  BMI 36.03 kg/m2  SpO2 99% Physical Exam  Constitutional: She is oriented to person, place, and time. She appears well-developed and well-nourished. No distress.  HENT:  Head: Normocephalic.  Mouth/Throat: Posterior oropharyngeal erythema present. No oropharyngeal exudate.  Eyes: Conjunctivae are normal. Pupils are equal, round, and reactive to light. No scleral icterus.  Neck: Normal range of motion. Neck supple. No thyromegaly present.  Cardiovascular: Normal rate and regular rhythm.  Exam reveals no gallop and no friction rub.   No murmur heard. Pulmonary/Chest: Effort normal. No respiratory distress. She has decreased breath sounds. She has no  wheezes. She has no rales.  Diminished bilateral breath sounds and prolongation.  Abdominal: Soft. Bowel sounds are normal. She exhibits no distension. There is no tenderness. There is no rebound.  Musculoskeletal: Normal range of motion.  Neurological: She is alert and oriented to person, place, and time.  Skin: Skin is warm and dry. No rash noted.  Psychiatric: She has a normal mood and affect. Her behavior is normal.    ED Course  Procedures (including critical care time) DIAGNOSTIC STUDIES: Oxygen Saturation is 98% on RA, nl by my interpretation.    COORDINATION OF CARE: 10:43 PM-Discussed treatment plan with pt at bedside and pt agreed to plan.   Labs Review Labs Reviewed - No data to display  Imaging Review Dg Chest 2 View  02/03/2015  CLINICAL DATA:  59 year old female with shortness of breath and cough.  EXAM: CHEST  2 VIEW  COMPARISON:  Chest radiograph dated 08/02/2011  FINDINGS: The heart size and mediastinal contours are within normal limits. Both lungs are clear. The visualized skeletal structures are unremarkable.  IMPRESSION: No active cardiopulmonary disease.   Electronically Signed   By: Anner Crete M.D.   On: 02/03/2015 22:18     EKG Interpretation None      MDM   Final diagnoses:  Cough  Bronchospasm    Given 1 hour of albuterol nebulizer. On reexam lungs clear. No additional prolongation. X-ray without acute findings. Not hypoxemic, tachypnea, tachycardia, or febrile. Plan is home, continue bronchodilators, Mucinex, Tessalon. Add prednisone.   Tanna Furry, MD 02/03/15 [phone]

## 2015-02-08 ENCOUNTER — Encounter: Payer: Self-pay | Admitting: *Deleted

## 2015-02-08 ENCOUNTER — Telehealth: Payer: Self-pay | Admitting: *Deleted

## 2015-02-08 NOTE — Telephone Encounter (Signed)
Pre-Visit Call completed with patient and chart updated.   Pre-Visit Info documented in Specialty Comments under [employer].    

## 2015-02-09 ENCOUNTER — Ambulatory Visit (INDEPENDENT_AMBULATORY_CARE_PROVIDER_SITE_OTHER): Payer: 59 | Admitting: Family Medicine

## 2015-02-09 ENCOUNTER — Encounter: Payer: Self-pay | Admitting: Family Medicine

## 2015-02-09 VITALS — BP 128/74 | HR 86 | Temp 98.6°F | Resp 18 | Ht 64.5 in | Wt 209.0 lb

## 2015-02-09 DIAGNOSIS — F32A Depression, unspecified: Secondary | ICD-10-CM

## 2015-02-09 DIAGNOSIS — E785 Hyperlipidemia, unspecified: Secondary | ICD-10-CM | POA: Diagnosis not present

## 2015-02-09 DIAGNOSIS — J4521 Mild intermittent asthma with (acute) exacerbation: Secondary | ICD-10-CM

## 2015-02-09 DIAGNOSIS — F329 Major depressive disorder, single episode, unspecified: Secondary | ICD-10-CM

## 2015-02-09 DIAGNOSIS — J0110 Acute frontal sinusitis, unspecified: Secondary | ICD-10-CM

## 2015-02-09 DIAGNOSIS — I10 Essential (primary) hypertension: Secondary | ICD-10-CM

## 2015-02-09 DIAGNOSIS — B029 Zoster without complications: Secondary | ICD-10-CM | POA: Diagnosis not present

## 2015-02-09 MED ORDER — FLUTICASONE PROPIONATE 50 MCG/ACT NA SUSP: 2.0000 | Freq: Every day | NASAL | Status: DC

## 2015-02-09 MED ORDER — CEFDINIR 300 MG PO CAPS: 300.0000 mg | ORAL_CAPSULE | Freq: Two times a day (BID) | ORAL | Status: DC

## 2015-02-09 MED ORDER — ESCITALOPRAM OXALATE 10 MG PO TABS: 10.0000 mg | ORAL_TABLET | Freq: Every day | ORAL | Status: DC

## 2015-02-09 MED ORDER — MONTELUKAST SODIUM 10 MG PO TABS
10.0000 mg | ORAL_TABLET | Freq: Every day | ORAL | Status: AC
Start: 2015-02-09 — End: ?

## 2015-02-09 MED ORDER — HYDROCHLOROTHIAZIDE 25 MG PO TABS: 25.0000 mg | ORAL_TABLET | Freq: Every day | ORAL | Status: DC

## 2015-02-09 MED ORDER — BENZONATATE 100 MG PO CAPS: 200.0000 mg | ORAL_CAPSULE | Freq: Three times a day (TID) | ORAL | Status: DC | PRN

## 2015-02-09 MED ORDER — VALACYCLOVIR HCL 1 G PO TABS: ORAL_TABLET | ORAL | Status: DC

## 2015-02-09 MED ORDER — ATORVASTATIN CALCIUM 10 MG PO TABS
10.0000 mg | ORAL_TABLET | Freq: Every day | ORAL | Status: AC
Start: 2015-02-09 — End: ?

## 2015-02-09 NOTE — Patient Instructions (Signed)

## 2015-02-09 NOTE — Progress Notes (Signed)
Pre visit review using our clinic review tool, if applicable. No additional management support is needed unless otherwise documented below in the visit note. 

## 2015-02-09 NOTE — Progress Notes (Signed)
  Subjective:     Anita Benton is a 59 y.o. female who presents for evaluation of sinus pain. Symptoms include: congestion, frequent clearing of the throat, nasal congestion, post nasal drip, sinus pressure and sore throat. Onset of symptoms was 2 weeks ago. Symptoms have been gradually worsening since that time. Past history is significant for no history of pneumonia or bronchitis. Patient is a non-smoker.  The following portions of the patient's history were reviewed and updated as appropriate:  She  has a past medical history of Shingles (04/01/2012); Asthma; Hyperlipidemia; Vitamin D deficiency; Allergy; Itching; Keloid; Menopause; Right leg DVT (1980's); Shortness of breath (04/01/2012); Migraines (04/01/2012); Stroke (1977; 04/01/2012); and Arthritis. She  does not have any pertinent problems on file. She  has past surgical history that includes Cesarean section (1974, 1977, 1979); Abdominal hysterectomy (1980's); Varicose vein surgery (1970's); and Finger surgery (10/09/14). Her family history includes Cancer in her mother; Hyperlipidemia in her mother; Hypertension in her mother. There is no history of Sudden death, Diabetes, or Heart attack. She  reports that she has never smoked. She has never used smokeless tobacco. She reports that she does not drink alcohol or use illicit drugs. She has a current medication list which includes the following prescription(s): albuterol, atorvastatin, budesonide-formoterol, cyclobenzaprine, escitalopram, fish oil-cholecalciferol, hydrochlorothiazide, hydrocodone-acetaminophen, montelukast, multiple vitamin, prednisone, and valacyclovir. Current Outpatient Prescriptions on File Prior to Visit  Medication Sig Dispense Refill  . albuterol (PROVENTIL HFA;VENTOLIN HFA) 108 (90 BASE) MCG/ACT inhaler Inhale 2 puffs into the lungs every 6 (six) hours as needed. For shortness of breath or wheezing 1 Inhaler 1  . budesonide-formoterol (SYMBICORT) 160-4.5 MCG/ACT inhaler  Inhale 2 puffs into the lungs 2 (two) times daily. 1 Inhaler 1  . cyclobenzaprine (FLEXERIL) 5 MG tablet Take 1 tablet (5 mg total) by mouth every 8 (eight) hours as needed for muscle spasms. 60 tablet 1  . Fish Oil-Cholecalciferol (FISH OIL + D3 PO) Take 1 tablet by mouth daily.    Marland Kitchen HYDROcodone-acetaminophen (HYCET) 7.5-325 mg/15 ml solution Take 10 mLs by mouth 4 (four) times daily as needed for moderate pain. 120 mL 0  . MULTIPLE VITAMIN PO Take 1 tablet by mouth.    . predniSONE (DELTASONE) 20 MG tablet Take 1 tablet (20 mg total) by mouth 2 (two) times daily with a meal. 10 tablet 0   No current facility-administered medications on file prior to visit.   She is allergic to codeine; levofloxacin; and rice..  Review of Systems Pertinent items are noted in HPI.   Objective:    BP 128/74 mmHg  Pulse 86  Temp(Src) 98.6 F (37 C) (Oral)  Resp 18  Ht 5' 4.5" (1.638 m)  Wt 209 lb (94.802 kg)  BMI 35.33 kg/m2  SpO2 98% General appearance: alert, cooperative, appears stated age and no distress Head: Normocephalic, without obvious abnormality, atraumatic Eyes: conjunctivae/corneas clear. PERRL, EOM's intact. Fundi benign. Ears: normal TM's and external ear canals both ears Nose: Nares normal. Septum midline. Mucosa normal. No drainage or sinus tenderness. Throat: lips, mucosa, and tongue normal; teeth and gums normal Neck: no adenopathy, no carotid bruit, no JVD, supple, symmetrical, trachea midline and thyroid not enlarged, symmetric, no tenderness/mass/nodules Lungs: wheezes bilaterally Heart: S1, S2 normal Extremities: extremities normal, atraumatic, no cyanosis or edema    Assessment:    Acute bacterial sinusitis. -- exac asthma   Plan:    Nasal steroids per medication orders. Antihistamines per medication orders. Omnicef per medication orders.   pred taper

## 2015-08-19 ENCOUNTER — Telehealth: Payer: Self-pay | Admitting: Family Medicine

## 2015-08-19 ENCOUNTER — Encounter: Payer: Self-pay | Admitting: Family Medicine

## 2015-08-20 NOTE — Telephone Encounter (Signed)
CHARGE 

## 2015-08-20 NOTE — Telephone Encounter (Signed)
Pt was no show for cpe 08/19/15 8:30am, pt has not rescheduled, charge or no charge?

## 2015-08-23 ENCOUNTER — Encounter: Payer: Self-pay | Admitting: Family Medicine

## 2015-08-23 NOTE — Telephone Encounter (Signed)
Marked to charge and mailing letter °

## 2015-09-21 HISTORY — PX: TOTAL HIP ARTHROPLASTY: SHX124

## 2015-10-01 ENCOUNTER — Other Ambulatory Visit: Payer: Self-pay | Admitting: Nurse Practitioner

## 2015-10-01 ENCOUNTER — Encounter (INDEPENDENT_AMBULATORY_CARE_PROVIDER_SITE_OTHER): Payer: Self-pay

## 2015-10-01 ENCOUNTER — Ambulatory Visit
Admission: RE | Admit: 2015-10-01 | Discharge: 2015-10-01 | Disposition: A | Discharge status: Home / Self Care | Payer: Self-pay | Source: Ambulatory Visit | Attending: Nurse Practitioner | Admitting: Nurse Practitioner

## 2015-10-01 ENCOUNTER — Other Ambulatory Visit: Payer: Self-pay | Admitting: Internal Medicine

## 2015-10-01 DIAGNOSIS — M79606 Pain in leg, unspecified: Secondary | ICD-10-CM

## 2015-10-01 DIAGNOSIS — L539 Erythematous condition, unspecified: Secondary | ICD-10-CM

## 2015-10-01 DIAGNOSIS — R609 Edema, unspecified: Secondary | ICD-10-CM

## 2016-01-06 ENCOUNTER — Other Ambulatory Visit: Payer: Self-pay | Admitting: Internal Medicine

## 2016-01-06 ENCOUNTER — Ambulatory Visit
Admission: RE | Admit: 2016-01-06 | Discharge: 2016-01-06 | Disposition: A | Discharge status: Home / Self Care | Payer: BLUE CROSS/BLUE SHIELD | Source: Ambulatory Visit | Attending: Internal Medicine | Admitting: Internal Medicine

## 2016-01-06 DIAGNOSIS — R0789 Other chest pain: Secondary | ICD-10-CM

## 2016-02-04 ENCOUNTER — Ambulatory Visit (INDEPENDENT_AMBULATORY_CARE_PROVIDER_SITE_OTHER): Payer: BLUE CROSS/BLUE SHIELD | Admitting: Internal Medicine

## 2016-02-04 ENCOUNTER — Encounter: Payer: Self-pay | Admitting: Internal Medicine

## 2016-02-04 VITALS — BP 106/64 | HR 94 | Ht 64.0 in | Wt 212.6 lb

## 2016-02-04 DIAGNOSIS — J45991 Cough variant asthma: Secondary | ICD-10-CM | POA: Diagnosis not present

## 2016-02-04 MED ORDER — PREDNISONE 10 MG PO TABS
ORAL_TABLET | ORAL | Status: DC
Start: 2016-02-04 — End: 2016-02-24

## 2016-02-04 MED ORDER — BUDESONIDE-FORMOTEROL FUMARATE 80-4.5 MCG/ACT IN AERO: INHALATION_SPRAY | RESPIRATORY_TRACT | Status: DC

## 2016-02-04 MED ORDER — FAMOTIDINE 20 MG PO TABS
ORAL_TABLET | ORAL | Status: DC
Start: 2016-02-04 — End: 2016-03-03

## 2016-02-04 MED ORDER — OXYCODONE-ACETAMINOPHEN 10-325 MG PO TABS: 1.0000 | ORAL_TABLET | ORAL | Status: DC | PRN

## 2016-02-04 MED ORDER — PANTOPRAZOLE SODIUM 40 MG PO TBEC
40.0000 mg | DELAYED_RELEASE_TABLET | Freq: Every day | ORAL | Status: DC
Start: 2016-02-04 — End: 2016-03-03

## 2016-02-04 NOTE — Progress Notes (Signed)
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    02/04/2016 1st  Pulmonary office visit/ Wert   Chief Complaint  Patient presents with  . Pulmonary Consult    Referred by Dr. Emi Belfast. Pt states that she was dxed with Asthma in her 60's. She c/o increased cough and SOB for the past year. Her cough is prod at times with clear sputum. Cough seems to be worse when she eats and she sometimes gets choked. She states she is SOB "anytime" and sometimes wakes up with cough and SOB.     on symbicort 160 prior to worsening trend one year prior to OV   And uses saba 3 x daily and not night but symptoms also at hs - coughs so hard she vomits and pees on herself  Mucus is clear worse at hs and sinus symptoms flared about the same time her asthma did   Tessalon does not help cough/ saba only helps if uses neb form   No obvious other patterns in day to day or daytime variabilty or assoc cp or chest tightness, subjective wheeze overt   hb symptoms. No unusual exp hx or h/o childhood pna/ asthma or knowledge of premature birth.  Sleeping ok without nocturnal  or early am exacerbation  of respiratory  c/o's or need for noct saba. Also denies any obvious fluctuation of symptoms with weather or environmental changes or other aggravating or alleviating factors except as outlined above   Current Medications, Allergies, Complete Past Medical History, Past Surgical History, Family History, and Social History were reviewed in [employer] record.             Review of Systems  Constitutional: Negative for  fever, chills and unexpected weight change.  HENT: Positive for congestion, sore throat and trouble swallowing. Negative for dental problem, ear pain, nosebleeds, postnasal drip, rhinorrhea, sinus pressure, sneezing and voice change.   Eyes: Negative for visual disturbance.  Respiratory: Positive for cough and shortness of breath. Negative for choking.   Cardiovascular: Negative for chest pain and leg swelling.  Gastrointestinal: Negative for vomiting, abdominal pain and diarrhea.  Genitourinary: Negative for difficulty urinating.  Musculoskeletal: Positive for arthralgias.  Skin: Negative for rash.  Neurological: Negative for tremors, syncope and headaches.  Hematological: Does not bruise/bleed easily.       Objective:   Physical Exam  amb bf nad with freq throat clearing   Wt Readings from Last 3 Encounters:  02/04/16 212 lb 9.6 oz (96.435 kg)  02/09/15 209 lb (94.802 kg)  02/03/15 210 lb (95.255 kg)    Vital signs reviewed   HEENT: nl dentition, turbinates, and oropharynx. Nl external ear canals without cough reflex   NECK :  without JVD/Nodes/TM/ nl carotid upstrokes bilaterally   LUNGS: no acc muscle use,  Nl contour chest which is clear to A and P bilaterally without cough on insp or exp maneuvers   CV:  RRR  no s3 or murmur or increase in P2, no edema   ABD:  soft and nontender with nl inspiratory excursion in the supine position. No bruits or  organomegaly, bowel sounds nl  MS:  Nl gait/ ext warm without deformities, calf tenderness, cyanosis or clubbing No obvious joint restrictions   SKIN: warm and dry without lesions    NEURO:  alert, approp, nl sensorium with  no motor deficits     I personally reviewed images and agree with radiology impression as follows:  CXR:  01/06/16 No active cardiopulmonary disease   Labs 02/04/2016  Cbc with eos/IgE      Assessment & Plan:

## 2016-02-04 NOTE — Patient Instructions (Addendum)
Please see patient coordinator before you leave today  to schedule sinus CT   Work on inhaler technique:  relax and gently blow all the way out then take a nice smooth deep breath back in, triggering the inhaler at same time you start breathing in.  Hold for up to 5 seconds if you can. Blow out thru nose. Rinse and gargle with water when done  Change symbicort 80 Take 2 puffs first thing in am and then another 2 puffs about 12 hours later.   Ok for now to use nebulizer up to every 4 hours for cough or wheeze  Prednisone 10 mg take  4 each am x 2 days,   2 each am x 2 days,  1 each am x 2 days and stop   Take delsym two tsp every 12 hours and supplement if needed with  oxycodone up to  every 4 hours to suppress the urge to cough. Swallowing water or using ice chips/non mint and menthol containing candies (such as lifesavers or sugarless jolly ranchers) are also effective.  You should rest your voice and avoid activities that you know make you cough.  Once you have eliminated the cough for 3 straight days try reducing the oxycodone  then the delsym as tolerated.    Pantoprazole (protonix) 40 mg   Take  30-60 min before first meal of the day and Pepcid (famotidine)  20 mg one @  bedtime until return to office - this is the best way to tell whether stomach acid is contributing to your problem.    GERD (REFLUX)  is an extremely common cause of respiratory symptoms just like yours , many times with no obvious heartburn at all.    It can be treated with medication, but also with lifestyle changes including elevation of the head of your bed (ideally with 6 inch  bed blocks),  Smoking cessation, avoidance of late meals, excessive alcohol, and avoid fatty foods, chocolate, peppermint, colas, red wine, and acidic juices such as orange juice.  NO MINT OR MENTHOL PRODUCTS SO NO COUGH DROPS  USE SUGARLESS CANDY INSTEAD (Jolley ranchers or Stover's or Life Savers) or even ice chips will also do - the key is to  swallow to prevent all throat clearing. NO OIL BASED VITAMINS - use powdered substitutes - STOP FISH OIL    Please schedule a follow up office visit in 2 weeks, sooner if needed

## 2016-02-06 NOTE — Assessment & Plan Note (Addendum)
02/04/2016  After extensive coaching HFA effectiveness =    75% > try lower symbicort to 80 2bid  DDX of  difficult airways management almost all start with A and  include Adherence, Ace Inhibitors, Acid Reflux, Active Sinus Disease, Alpha 1 Antitripsin deficiency, Anxiety masquerading as Airways dz,  ABPA,  Allergy(esp in young), Aspiration (esp in elderly), Adverse effects of meds,  Active smokers, A bunch of PE's (a small clot burden can't cause this syndrome unless there is already severe underlying pulm or vascular dz with poor reserve) plus two Bs  = Bronchiectasis and Beta blocker use..and one C= CHF  Adherence is always the initial "prime suspect" and is a multilayered concern that requires a "trust but verify" approach in every patient - starting with knowing how to use medications, especially inhalers, correctly, keeping up with refills and understanding the fundamental difference between maintenance and prns vs those medications only taken for a very short course and then stopped and not refilled.  - The proper method of use, as well as anticipated side effects, of a metered-dose inhaler are discussed and demonstrated to the patient. Improved effectiveness after extensive coaching during this visit to a level of approximately 75 % from a baseline of 25 % > try symbicort 80 2bid  ? Acid (or non-acid) GERD > always difficult to exclude as up to 75% of pts in some series report no assoc GI/ Heartburn symptoms> rec max (24h)  acid suppression and diet restrictions/ reviewed and instructions given in writing.   ? Allergy >  Prednisone 10 mg take  4 each am x 2 days,   2 each am x 2 days,  1 each am x 2 days and stop  / continue singulair   ? Active sinus dz > sinus CT ordered   For now will try to control upper airway component of cough with oxycodone acutely and regroup in 2 weeks  Total time devoted to counseling  = 35/39m review case with pt/ discussion of options/alternatives/ personally  creating written instructions  in presence of pt  then going over those specific  Instructions directly with the pt including how to use all of the meds but in particular covering each new medication in detail and the difference between the maintenance/automatic meds and the prns using an action plan format for the latter.

## 2016-02-08 ENCOUNTER — Ambulatory Visit (HOSPITAL_BASED_OUTPATIENT_CLINIC_OR_DEPARTMENT_OTHER)
Admission: RE | Admit: 2016-02-08 | Discharge: 2016-02-08 | Disposition: A | Discharge status: Home / Self Care | Payer: BLUE CROSS/BLUE SHIELD | Source: Ambulatory Visit | Attending: Internal Medicine | Admitting: Internal Medicine

## 2016-02-08 DIAGNOSIS — J45991 Cough variant asthma: Secondary | ICD-10-CM | POA: Diagnosis present

## 2016-02-09 NOTE — Progress Notes (Signed)
Quick Note:  Spoke with pt and notified of results per Dr. Wert. Pt verbalized understanding and denied any questions.  ______ 

## 2016-02-24 ENCOUNTER — Other Ambulatory Visit (INDEPENDENT_AMBULATORY_CARE_PROVIDER_SITE_OTHER): Payer: BLUE CROSS/BLUE SHIELD

## 2016-02-24 ENCOUNTER — Ambulatory Visit (INDEPENDENT_AMBULATORY_CARE_PROVIDER_SITE_OTHER): Payer: BLUE CROSS/BLUE SHIELD | Admitting: Internal Medicine

## 2016-02-24 ENCOUNTER — Encounter: Payer: Self-pay | Admitting: Internal Medicine

## 2016-02-24 VITALS — BP 114/70 | HR 75 | Ht 64.0 in | Wt 213.0 lb

## 2016-02-24 DIAGNOSIS — J45991 Cough variant asthma: Secondary | ICD-10-CM

## 2016-02-24 LAB — CBC WITH DIFFERENTIAL/PLATELET
BASOS PCT: 0.5 % (ref 0.0–3.0)
Basophils Absolute: 0 10*3/uL (ref 0.0–0.1)
EOS PCT: 1 % (ref 0.0–5.0)
Eosinophils Absolute: 0.1 10*3/uL (ref 0.0–0.7)
HEMATOCRIT: 41.5 % (ref 36.0–46.0)
HEMOGLOBIN: 14.3 g/dL (ref 12.0–15.0)
LYMPHS ABS: 2.1 10*3/uL (ref 0.7–4.0)
Lymphocytes Relative: 26 % (ref 12.0–46.0)
MCHC: 34.5 g/dL (ref 30.0–36.0)
MCV: 85 fl (ref 78.0–100.0)
MONO ABS: 0.6 10*3/uL (ref 0.1–1.0)
MONOS PCT: 7.5 % (ref 3.0–12.0)
Neutro Abs: 5.3 10*3/uL (ref 1.4–7.7)
Neutrophils Relative %: 65 % (ref 43.0–77.0)
Platelets: 335 10*3/uL (ref 150.0–400.0)
RBC: 4.88 Mil/uL (ref 3.87–5.11)
RDW: 14.1 % (ref 11.5–15.5)
WBC: 8.1 10*3/uL (ref 4.0–10.5)

## 2016-02-24 MED ORDER — FLUTTER DEVI: 0 refills | Status: DC

## 2016-02-24 NOTE — Patient Instructions (Addendum)
Please remember to go to the lab  department downstairs for your tests - we will call you with the results when they are available.     When you feel the urge to cough, cough into the flutter valve  Try off symbicort and just use the nebulizer   Please schedule a follow up office visit in 2 weeks, sooner if needed

## 2016-02-24 NOTE — Assessment & Plan Note (Signed)
02/04/2016  After extensive coaching HFA effectiveness =    75% > try lower symbicort to 80 2bid - cyclical cough rx 0000000 >>> min better - sinus ct 02/08/2016 > No paranasal sinus disease. - Spirometry 02/24/2016  Nl including mid flows> try off symb 80 and just use prn neb  - Allergy profile 02/24/2016 >  Eos 0. /  IgE - 02/24/2016 added flutter valve    Lack of even transient convincing  response to prednisone is strongly against asthma or eos bronchitis and the cough she demonstrated on exam today is classic for UACS / ? Irritable larynx, not asthma   Rec:  Trial of flutter W/u for allergies with allergy screen  Trial off symbicort  I had an extended discussion with the patient reviewing all relevant studies completed to date and  lasting 15 to 20 minutes of a 25 minute visit    Each maintenance medication was reviewed in detail including most importantly the difference between maintenance and prns and under what circumstances the prns are to be triggered using an action plan format that is not reflected in the computer generated alphabetically organized AVS.    Please see instructions for details which were reviewed in writing and the patient given a copy highlighting the part that I personally wrote and discussed at today's ov.

## 2016-02-24 NOTE — Progress Notes (Signed)
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    02/04/2016 1st [facility] office visit/ Wert   Chief Complaint  Patient presents with  . Pulmonary Consult    Referred by Dr. Emi Belfast. Pt states that she was dxed with Asthma in her 60's. She c/o increased cough and SOB for the past year. Her cough is prod at times with clear sputum. Cough seems to be worse when she eats and she sometimes gets choked. She states she is SOB "anytime" and sometimes wakes up with cough and SOB.     on symbicort 160 prior to worsening trend one year prior to OV   And uses saba 3 x daily and not night but symptoms also at hs - coughs so hard she vomits and pees on herself  Mucus is clear worse at hs and sinus symptoms flared about the same time her asthma did   Tessalon does not help cough/ saba only helps if uses neb form rec   schedule sinus CT > neg  Work on inhaler technique:   Change symbicort 80 Take 2 puffs first thing in am and then another 2 puffs about 12 hours later.  Ok for now to use nebulizer up to every 4 hours for cough or wheeze Prednisone 10 mg take  4 each am x 2 days,   2 each am x 2 days,  1 each am x 2 days and stop  Take delsym two tsp every 12 hours and supplement if needed with  oxycodone up to  every 4 hours Pantoprazole (protonix) 40 mg   Take  30-60 min before first meal of the day and Pepcid (famotidine)  20 mg one @  bedtime until return to office GERD diet    02/24/2016  f/u ov/Wert re: cough x 20 y, worse x one year/ minimally improved on symb 80 2bid  Chief Complaint  Patient  presents with  . Follow-up    Cough has improved some. She states still coughing up large amounts of yellow sputum in the am's.  Her breathing is much improved and she has not use rescue inhaler but uses neb every day.   cough tends to be worse around 3-4pm and overall only a little better than it was before intense rx with tramadol/ prednisone - not clear taking meds correctly as just ran out of tramadol sev days prior to OV  Whereas was  Supposed to take up to 12 / day to eliminate cylcical cough    No obvious day to day or daytime variability or assoc sob  or cp or chest tightness, subjective wheeze or overt sinus or hb symptoms. No unusual exp hx or h/o childhood pna/ asthma or knowledge of premature birth.  Sleeping ok without nocturnal  or early am exacerbation  of respiratory  c/o's or need for noct saba. Also denies any obvious fluctuation of symptoms with weather or environmental changes or other aggravating or alleviating factors except as outlined above   Current Medications, Allergies, Complete Past Medical History, Past Surgical History, Family History, and Social History were reviewed in  [facility] electronic medical record.  ROS  The following are not active complaints unless bolded sore throat, dysphagia, dental problems, itching, sneezing,  nasal congestion or excess/ purulent secretions, ear ache,   fever, chills, sweats, unintended wt loss, classically pleuritic or exertional cp,  orthopnea pnd or leg swelling, presyncope, palpitations, abdominal pain, anorexia, nausea, vomiting, diarrhea  or change in bowel or bladder habits, change in stools or urine, dysuria,hematuria,  rash, arthralgias, visual complaints, headache, numbness, weakness or ataxia or problems with walking or coordination,  change in mood/affect or memory.                       Objective:   Physical Exam  amb bf nad  freq throat clearing / harsh barking upper airway cough    02/24/2016          02/04/16 212 lb 9.6 oz (96.435 kg)  02/09/15 209 lb (94.802 kg)  02/03/15 210 lb (95.255 kg)    Vital signs reviewed   HEENT: nl dentition, turbinates, and oropharynx which is pristine/ Nl external ear canals without cough reflex   NECK :  without JVD/Nodes/TM/ nl carotid upstrokes bilaterally   LUNGS: no acc muscle use,  Nl contour chest which is clear to A and P bilaterally without cough on insp or exp maneuvers   CV:  RRR  no s3 or murmur or increase in P2, no edema   ABD:  soft and nontender with nl inspiratory excursion in the supine position. No bruits or organomegaly, bowel sounds nl  MS:  Nl gait/ ext warm without deformities, calf tenderness, cyanosis or clubbing No obvious joint restrictions   SKIN: warm and dry without lesions    NEURO:  alert, approp, nl sensorium with  no motor deficits       I personally reviewed images and agree with radiology impression as follows:  CXR:  01/06/16 No active cardiopulmonary disease  Labs ordered 02/24/2016  Cbc with diff/ allergy profile        Assessment & Plan:

## 2016-02-28 LAB — RESPIRATORY ALLERGY PROFILE REGION II ~~LOC~~
Allergen, Cedar tree, t12: <0.1 kU/L
Allergen, Comm Silver Birch, t9: <0.1 kU/L
Allergen, Cottonwood, t14: <0.1 kU/L
Allergen, Mouse Urine Protein, e78: <0.1 kU/L
Allergen, Oak,t7: <0.1 kU/L
Bermuda Grass: <0.1 kU/L
Box Elder IgE: <0.1 kU/L
Dog Dander: <0.1 kU/L
Elm IgE: <0.1 kU/L
IgE (Immunoglobulin E), Serum: 43 kU/L (ref <115)
Penicillium Notatum: <0.1 kU/L
Sheep Sorrel IgE: <0.1 kU/L

## 2016-03-03 ENCOUNTER — Other Ambulatory Visit: Payer: Self-pay | Admitting: Internal Medicine

## 2016-03-03 DIAGNOSIS — J45991 Cough variant asthma: Secondary | ICD-10-CM

## 2016-03-03 MED ORDER — FAMOTIDINE 20 MG PO TABS: ORAL_TABLET | ORAL | 0 refills | Status: DC

## 2016-03-03 MED ORDER — PANTOPRAZOLE SODIUM 40 MG PO TBEC: 40.0000 mg | DELAYED_RELEASE_TABLET | Freq: Every day | ORAL | 0 refills | Status: DC

## 2016-03-14 ENCOUNTER — Ambulatory Visit (INDEPENDENT_AMBULATORY_CARE_PROVIDER_SITE_OTHER): Payer: BLUE CROSS/BLUE SHIELD | Admitting: Adult Health

## 2016-03-14 ENCOUNTER — Encounter: Payer: Self-pay | Admitting: Adult Health

## 2016-03-14 DIAGNOSIS — J45991 Cough variant asthma: Secondary | ICD-10-CM

## 2016-03-14 NOTE — Assessment & Plan Note (Signed)
Patient Instructions  Add Zyrtec 10mg  At bedtime  As needed  Drainage .  Continue on Protonix and Pepcid . Continue on GERD diet.  Follow up with Dr. Melvyn Novas  In 3 months and As needed

## 2016-03-14 NOTE — Progress Notes (Signed)
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    02/04/2016 1st [facility] office visit/ Wert   Chief Complaint  Patient presents with  . Pulmonary Consult    Referred by Dr. Emi Belfast. Pt states that she was dxed with Asthma in her 40's. She c/o increased cough and SOB for the past year. Her cough is prod at times with clear sputum. Cough seems to be worse when she eats and she sometimes gets choked. She states she is SOB "anytime" and sometimes wakes up with cough and SOB.     on symbicort 160 prior to worsening trend one year prior to OV   And uses saba 3 x daily and not night but symptoms also at hs - coughs so hard she vomits and pees on herself  Mucus is clear worse at hs and sinus symptoms flared about the same time her asthma did   Tessalon does not help cough/ saba only helps if uses neb form rec   schedule sinus CT > neg  Work on inhaler technique:   Change symbicort 80 Take 2 puffs first thing in am and then another 2 puffs about 12 hours later.  Ok for now to use nebulizer up to every 4 hours for cough or wheeze Prednisone 10 mg take  4 each am x 2 days,   2 each am x 2 days,  1 each am x 2 days and stop  Take delsym two tsp every 12 hours and supplement if needed with  oxycodone up to  every 4 hours Pantoprazole (protonix) 40 mg   Take  30-60 min before first meal of the day and Pepcid (famotidine)  20 mg one @  bedtime until return to office GERD diet    02/24/2016  f/u ov/Wert re: cough x 20 y, worse x one year/ minimally improved on symb 80 2bid  Chief Complaint  Patient  presents with  . Follow-up    Cough has improved some. She states still coughing up large amounts of yellow sputum in the am's.  Her breathing is much improved and she has not use rescue inhaler but uses neb every day.   cough tends to be worse around 3-4pm and overall only a little better than it was before intense rx with tramadol/ prednisone - not clear taking meds correctly as just ran out of tramadol sev days prior to OV  Whereas was  Supposed to take up to 12 / day to eliminate cylcical cough  >>stop symbicort   03/14/2016 Follow up : Chronic cough  Pt returns for 3 week follow up for cough .  Pt c/o prod cough with clear with pink mucus, sinus pressure/drainage, headaches. Has lots of sinus drainge and post nasal drip . Denies any SOB/wheezing, chest congestion/tightness, fever, nasuea or vomiting.  Previous spirometry was nml. Allergy labs showed IgE 43, RAST neg. , eos nml.    Current Medications, Allergies, Complete Past Medical History, Past Surgical History, Family History, and Social History were reviewed in [employer] record.  ROS  The following are not active complaints  unless bolded sore throat, dysphagia, dental problems, itching, sneezing,  nasal congestion or excess/ purulent secretions, ear ache,   fever, chills, sweats, unintended wt loss, classically pleuritic or exertional cp,  orthopnea pnd or leg swelling, presyncope, palpitations, abdominal pain, anorexia, nausea, vomiting, diarrhea  or change in bowel or bladder habits, change in stools or urine, dysuria,hematuria,  rash, arthralgias, visual complaints, headache, numbness, weakness or ataxia or problems with walking or coordination,  change in mood/affect or memory.                       Objective:   Physical Exam  amb bf nad   Vitals:   03/14/16 1151  BP: 106/70  Pulse: 85  Temp: 97.9 F (36.6 C)  TempSrc: Oral  SpO2: 95%  Weight: 214 lb (97.1 kg)  Height: 5\' 4"  (1.626 m)          Vital signs reviewed   HEENT: nl dentition, turbinates, and oropharynx which is pristine/ Nl external ear canals without cough reflex   NECK :  without JVD/Nodes/TM/ nl carotid upstrokes bilaterally   LUNGS: no acc muscle use,  Nl contour chest which is clear to A and P bilaterally without cough on insp or exp maneuvers   CV:  RRR  no s3 or murmur or increase in P2, no edema   ABD:  soft and nontender with nl inspiratory excursion in the supine position. No bruits or organomegaly, bowel sounds nl  MS:  Nl gait/ ext warm without deformities, calf tenderness, cyanosis or clubbing No obvious joint restrictions   SKIN: warm and dry without lesions    NEURO:  alert, approp, nl sensorium with  no motor deficits        CXR:  01/06/16 No active cardiopulmonary disease  Labs ordered 02/24/2016  Cbc with diff/ allergy profile IgE 43, neg RAST , nml eos

## 2016-03-14 NOTE — Patient Instructions (Addendum)
Add Zyrtec 10mg  At bedtime  As needed  Drainage .  Continue on Protonix and Pepcid . Continue on GERD diet.  Follow up with Dr. Melvyn Novas  In 3 months and As needed

## 2016-04-19 ENCOUNTER — Encounter (HOSPITAL_BASED_OUTPATIENT_CLINIC_OR_DEPARTMENT_OTHER): Payer: Self-pay

## 2016-04-19 ENCOUNTER — Emergency Department (HOSPITAL_BASED_OUTPATIENT_CLINIC_OR_DEPARTMENT_OTHER)
Admission: EM | Admit: 2016-04-19 | Discharge: 2016-04-19 | Disposition: A | Discharge status: Home / Self Care | Payer: Worker's Compensation | Attending: Emergency Medicine | Admitting: Emergency Medicine

## 2016-04-19 DIAGNOSIS — I10 Essential (primary) hypertension: Secondary | ICD-10-CM | POA: Insufficient documentation

## 2016-04-19 DIAGNOSIS — Y929 Unspecified place or not applicable: Secondary | ICD-10-CM | POA: Insufficient documentation

## 2016-04-19 DIAGNOSIS — W268XXA Contact with other sharp object(s), not elsewhere classified, initial encounter: Secondary | ICD-10-CM | POA: Insufficient documentation

## 2016-04-19 DIAGNOSIS — Z23 Encounter for immunization: Secondary | ICD-10-CM | POA: Insufficient documentation

## 2016-04-19 DIAGNOSIS — S59912A Unspecified injury of left forearm, initial encounter: Secondary | ICD-10-CM | POA: Insufficient documentation

## 2016-04-19 DIAGNOSIS — Y939 Activity, unspecified: Secondary | ICD-10-CM | POA: Diagnosis not present

## 2016-04-19 DIAGNOSIS — Y990 Civilian activity done for income or pay: Secondary | ICD-10-CM | POA: Diagnosis not present

## 2016-04-19 DIAGNOSIS — S4992XA Unspecified injury of left shoulder and upper arm, initial encounter: Secondary | ICD-10-CM

## 2016-04-19 HISTORY — DX: Gastro-esophageal reflux disease without esophagitis: K21.9

## 2016-04-19 MED ORDER — TETANUS-DIPHTH-ACELL PERTUSSIS 5-2.5-18.5 LF-MCG/0.5 IM SUSP
0.5000 mL | Freq: Once | INTRAMUSCULAR | Status: AC
  Administered 2016-04-19: 0.5 mL via INTRAMUSCULAR

## 2016-04-19 MED ORDER — TETANUS-DIPHTH-ACELL PERTUSSIS 5-2.5-18.5 LF-MCG/0.5 IM SUSP
INTRAMUSCULAR | Status: AC
  Filled 2016-04-19: qty 0.5

## 2016-04-19 NOTE — ED Provider Notes (Signed)
Emergency Department Provider Note   I have reviewed the triage vital signs and the nursing notes.   HISTORY  Chief Complaint Arm Injury   HPI Anita Benton is a 60 y.o. female with PMH of GERD and DM presents to the emergency department for evaluation of laceration to the left forearm. The patient states that she inadvertently scraped the area on a dumpster this morning. She did not initially realize the injury but felt some dripping blood later and so after work presented to the emergency department. She denies any significant pain in the shoulder, elbow, wrist. Other scrapes or cuts. No additional bleeding.   Past Medical History:  Diagnosis Date  . Allergy   . Arthritis    "ankles; right hip"  . GERD (gastroesophageal reflux disease)   . Hyperlipidemia   . Itching   . Keloid    "q where; I have keloid skin"  . Menopause   . Migraines 04/01/2012  . Right leg DVT ([place]) 1980's  . Shingles 04/01/2012   "have them q year; usually in Nov; for the last 20 years"  . Shortness of breath 04/01/2012   "when my asthma acts up"  . Stroke [facility]) 1977; 04/01/2012   denies residual; "from BCP RX"; little weak right arm; right face little numb"  . Vitamin D deficiency     Patient Active Problem List   Diagnosis Date Noted  . Cough variant asthma with component of UACS 02/04/2016  . Edema 04/29/2013  . Foot swelling 10/21/2012  . Low back pain 08/09/2012  . URI, acute 08/05/2012  . Cough 08/05/2012  . Left hip pain 08/05/2012  . Zoster 07/02/2012  . Right hip pain 05/23/2012  . Herniated disc, cervical 04/09/2012  . Headache, hemiplegic migraine 04/04/2012  . Gait abnormality 04/03/2012  . HTN (hypertension) 04/01/2012  . Finger pain, right 04/01/2012  . Finger, open wounds, complicated [id number]  . Varicose veins 06/27/2011  . Urinary tract infection, site not specified 06/27/2011  . Nevus 06/27/2011  . DVT, lower extremity ([place]) 06/13/2011  . Arthritis   . Asthma in  adult   . Hyperlipidemia   . Shingles   . Vitamin D deficiency   . Allergy   . Keloid   . Menopause     Past Surgical History:  Procedure Laterality Date  . ABDOMINAL HYSTERECTOMY  1980's  . [place]  . FINGER SURGERY  10/09/14  . TOTAL HIP ARTHROPLASTY Right 09-21-15  . VARICOSE VEIN SURGERY  1970's   bilaterally    Current Outpatient Rx  . Order #: [id number] Class: Historical Med  . Order #: [id number] Class: Normal  . Order #: [id number] Class: Normal  . Order #: [id number] Class: Normal  . Order #: [id number] Class: Normal  . Order #: [id number] Class: Normal  . Order #: [id number] Class: Normal  . Order #: [id number] Class: Historical Med  . Order #: [id number] Class: Normal  . Order #: [id number] Class: Print    Allergies Codeine; Levofloxacin; and Rice  Family History  Problem Relation Age of Onset  . Lung cancer Mother     smoked  . Hyperlipidemia Mother   . Hypertension Mother   . Bone cancer Father   . Sudden death Neg Hx   . Diabetes Neg Hx   . Heart attack Neg Hx     Social History Social History  Substance Use Topics  . Smoking status: Never Smoker  . Smokeless tobacco: Never Used  . Alcohol use No    Review of Systems  Constitutional: No fever/chills Eyes: No visual changes. ENT: No sore throat. Cardiovascular: Denies chest pain. Respiratory: Denies shortness of breath. Gastrointestinal: No abdominal pain.  No nausea, no vomiting.  No diarrhea.  No constipation. Genitourinary: Negative for dysuria. Musculoskeletal: Negative for back pain. Skin: Negative for rash. Laceration to the left arm.  Neurological: Negative for headaches, focal weakness or numbness.  10-point ROS otherwise negative.  ____________________________________________   PHYSICAL EXAM:  VITAL SIGNS: ED Triage Vitals  Enc Vitals Group     BP 04/19/16 1359 116/86     Pulse Rate 04/19/16 1359 80     Resp 04/19/16 1359 20     Temp 04/19/16 1359 98 F (36.7 C)       Temp Source 04/19/16 1359 Oral     SpO2 04/19/16 1359 98 %     Weight 04/19/16 1400 220 lb (99.8 kg)     Height 04/19/16 1400 5\' 4"  (1.626 m)     Pain Score 04/19/16 1355 7   Constitutional: Alert and oriented. Well appearing and in no acute distress. Eyes: Conjunctivae are normal. Head: Atraumatic. Nose: No congestion/rhinnorhea. Mouth/Throat: Mucous membranes are moist.  Oropharynx non-erythematous. Neck: No stridor. Cardiovascular: Normal rate, regular rhythm. Good peripheral circulation. Grossly normal heart sounds.   Respiratory: Normal respiratory effort.  No retractions. Lungs CTAB. Gastrointestinal: Soft and nontender. No distention.  Musculoskeletal: No lower extremity tenderness nor edema. No gross deformities of extremities. Neurologic:  Normal speech and language. No gross focal neurologic deficits are appreciated.  Skin:  Skin is warm, dry and intact. 6 cm linear abrasion that is well approximated. Wound is hemostatic with no surrounding erythema.  Psychiatric: Mood and affect are normal. Speech and behavior are normal. ____________________________________________   PROCEDURES  Procedure(s) performed:   Procedures  None ____________________________________________   INITIAL IMPRESSION / ASSESSMENT AND PLAN / ED COURSE  Pertinent labs & imaging results that were available during my care of the patient were reviewed by me and considered in my medical decision making (see chart for details).  Patient has a very superficial scrape to the left forearm. The wound is hemostatic. The edges are well approximated. This will not require closure or antibiotics. I discussed return precautions for wound infection. The patient's tetanus was updated in the ED. Plan to discharge.  At this time, I do not feel there is any life-threatening condition present. I have reviewed and discussed all results (EKG, imaging, lab, urine as appropriate), exam findings with patient. I have  reviewed nursing notes and appropriate previous records.  I feel the patient is safe to be discharged home without further emergent workup. Discussed usual and customary return precautions. Patient and family (if present) verbalize understanding and are comfortable with this plan.  Patient will follow-up with their primary care provider. If they do not have a primary care provider, information for follow-up has been provided to them. All questions have been answered.  ____________________________________________  FINAL CLINICAL IMPRESSION(S) / ED DIAGNOSES  Final diagnoses:  Arm injury, left, initial encounter     MEDICATIONS GIVEN DURING THIS VISIT:  Medications  Tdap (BOOSTRIX) injection 0.5 mL (0.5 mLs Intramuscular Given 04/19/16 1554)     NEW OUTPATIENT MEDICATIONS STARTED DURING THIS VISIT:  None   Note:  This document was prepared using Dragon voice recognition software and may include unintentional dictation errors.  Nanda Quinton, MD Emergency Medicine   Margette Fast, MD 04/19/16 [phone]

## 2016-04-19 NOTE — ED Notes (Signed)
Supervisor Helene Kelp states no need for UDS

## 2016-04-19 NOTE — ED Triage Notes (Signed)
Cut left forearm on dumpster at work this am-scratch with no bleeding noted-NAD-steady gait

## 2016-04-19 NOTE — Discharge Instructions (Signed)
You were seen in the ED today with scrape to the left arm. We evaluated you closely and believe that you are safe to return home. Watch closely for any redness, swelling, or drainage to the arm. If this develops you should return because this could be evidence of infection.

## 2016-04-26 ENCOUNTER — Other Ambulatory Visit: Payer: Self-pay | Admitting: Internal Medicine

## 2016-04-26 ENCOUNTER — Ambulatory Visit
Admission: RE | Admit: 2016-04-26 | Discharge: 2016-04-26 | Disposition: A | Discharge status: Home / Self Care | Payer: BLUE CROSS/BLUE SHIELD | Source: Ambulatory Visit | Attending: Internal Medicine | Admitting: Internal Medicine

## 2016-04-26 DIAGNOSIS — M25561 Pain in right knee: Secondary | ICD-10-CM

## 2016-04-26 DIAGNOSIS — M25562 Pain in left knee: Secondary | ICD-10-CM

## 2016-04-27 ENCOUNTER — Encounter (HOSPITAL_BASED_OUTPATIENT_CLINIC_OR_DEPARTMENT_OTHER): Payer: Self-pay

## 2016-04-27 ENCOUNTER — Emergency Department (HOSPITAL_BASED_OUTPATIENT_CLINIC_OR_DEPARTMENT_OTHER)
Admission: EM | Admit: 2016-04-27 | Discharge: 2016-04-27 | Disposition: A | Discharge status: Home / Self Care | Payer: Worker's Compensation | Attending: Emergency Medicine | Admitting: Emergency Medicine

## 2016-04-27 DIAGNOSIS — W260XXA Contact with knife, initial encounter: Secondary | ICD-10-CM | POA: Insufficient documentation

## 2016-04-27 DIAGNOSIS — I10 Essential (primary) hypertension: Secondary | ICD-10-CM | POA: Insufficient documentation

## 2016-04-27 DIAGNOSIS — S61011A Laceration without foreign body of right thumb without damage to nail, initial encounter: Secondary | ICD-10-CM | POA: Diagnosis not present

## 2016-04-27 DIAGNOSIS — Y929 Unspecified place or not applicable: Secondary | ICD-10-CM | POA: Insufficient documentation

## 2016-04-27 DIAGNOSIS — Y93G1 Activity, food preparation and clean up: Secondary | ICD-10-CM | POA: Insufficient documentation

## 2016-04-27 DIAGNOSIS — Y999 Unspecified external cause status: Secondary | ICD-10-CM | POA: Diagnosis not present

## 2016-04-27 DIAGNOSIS — J45909 Unspecified asthma, uncomplicated: Secondary | ICD-10-CM | POA: Diagnosis not present

## 2016-04-27 DIAGNOSIS — S6991XA Unspecified injury of right wrist, hand and finger(s), initial encounter: Secondary | ICD-10-CM | POA: Diagnosis present

## 2016-04-27 MED ORDER — BACITRACIN ZINC 500 UNIT/GM EX OINT
TOPICAL_OINTMENT | Freq: Two times a day (BID) | CUTANEOUS | Status: DC
  Administered 2016-04-27: 16:00:00 via TOPICAL

## 2016-04-27 NOTE — ED Provider Notes (Signed)
[facility] Provider Note   CSN: [id number] Arrival date & time: 04/27/16  1349     History   Chief Complaint Chief Complaint  Patient presents with  . Laceration    HPI Anita Benton is a 60 y.o. female.  HPI   Anita Benton is a 60 y.o. female, patient with no pertinent past medical history, presenting to the ED with Laceration to the right thumb that occurred yesterday while at work. Patient was washing dishes when she received a laceration from a knife sitting in the sink. Patient endorses associated mild throbbing that is nonradiating. Recently updated Tdap. Denies neuro deficits, other injuries, or other complaints.      Past Medical History:  Diagnosis Date  . Allergy   . Arthritis    "ankles; right hip"  . GERD (gastroesophageal reflux disease)   . Hyperlipidemia   . Itching   . Keloid    "q where; I have keloid skin"  . Menopause   . Migraines 04/01/2012  . Right leg DVT ([place]) 1980's  . Shingles 04/01/2012   "have them q year; usually in Nov; for the last 20 years"  . Shortness of breath 04/01/2012   "when my asthma acts up"  . Stroke [facility]) 1977; 04/01/2012   denies residual; "from BCP RX"; little weak right arm; right face little numb"  . Vitamin D deficiency     Patient Active Problem List   Diagnosis Date Noted  . Cough variant asthma with component of UACS 02/04/2016  . Edema 04/29/2013  . Foot swelling 10/21/2012  . Low back pain 08/09/2012  . URI, acute 08/05/2012  . Cough 08/05/2012  . Left hip pain 08/05/2012  . Zoster 07/02/2012  . Right hip pain 05/23/2012  . Herniated disc, cervical 04/09/2012  . Headache, hemiplegic migraine 04/04/2012  . Gait abnormality 04/03/2012  . HTN (hypertension) 04/01/2012  . Finger pain, right 04/01/2012  . Finger, open wounds, complicated [id number]  . Varicose veins 06/27/2011  . Urinary tract infection, site not specified 06/27/2011  . Nevus 06/27/2011  . DVT, lower extremity ([place])  06/13/2011  . Arthritis   . Asthma in adult   . Hyperlipidemia   . Shingles   . Vitamin D deficiency   . Allergy   . Keloid   . Menopause     Past Surgical History:  Procedure Laterality Date  . ABDOMINAL HYSTERECTOMY  1980's  . [place]  . FINGER SURGERY  10/09/14  . TOTAL HIP ARTHROPLASTY Right 09-21-15  . VARICOSE VEIN SURGERY  1970's   bilaterally    OB History    Gravida Para Term Preterm AB Living   4 3     1      SAB TAB Ectopic Multiple Live Births     1             Home Medications    Prior to Admission medications   Medication Sig Start Date End Date Taking? Authorizing Provider  albuterol (ACCUNEB) 1.25 MG/3ML nebulizer solution Take 1 ampule by nebulization every 6 (six) hours as needed for wheezing.    Historical Provider, MD  atorvastatin (LIPITOR) 10 MG tablet Take 1 tablet (10 mg total) by mouth daily. 02/09/15   Alferd Apa Lowne Chase, DO  escitalopram (LEXAPRO) 10 MG tablet Take 1 tablet (10 mg total) by mouth daily. 02/09/15   Rosalita Chessman Chase, DO  famotidine (PEPCID) 20 MG tablet One at bedtime 03/03/16   Christena Deem  Wert, MD  fluticasone (FLONASE) 50 MCG/ACT nasal spray Place 2 sprays into both nostrils daily. 02/09/15   Rosalita Chessman Chase, DO  hydrochlorothiazide (HYDRODIURIL) 25 MG tablet Take 1 tablet (25 mg total) by mouth daily. 02/09/15   Alferd Apa Lowne Chase, DO  montelukast (SINGULAIR) 10 MG tablet Take 1 tablet (10 mg total) by mouth at bedtime. 02/09/15   Rosalita Chessman Chase, DO  MULTIPLE VITAMIN PO Take 1 tablet by mouth.    Historical Provider, MD  pantoprazole (PROTONIX) 40 MG tablet Take 1 tablet (40 mg total) by mouth daily. Take 30-60 min before first meal of the day 03/03/16   Tanda Rockers, MD  Respiratory Therapy Supplies (FLUTTER) DEVI Use as directed 02/24/16   Tanda Rockers, MD    Family History Family History  Problem Relation Age of Onset  . Lung cancer Mother     smoked  . Hyperlipidemia Mother   .  Hypertension Mother   . Bone cancer Father   . Sudden death Neg Hx   . Diabetes Neg Hx   . Heart attack Neg Hx     Social History Social History  Substance Use Topics  . Smoking status: Never Smoker  . Smokeless tobacco: Never Used  . Alcohol use No     Allergies   Codeine; Levofloxacin; and Rice   Review of Systems Review of Systems  Skin: Positive for wound.  Neurological: Negative for weakness and numbness.     Physical Exam Updated Vital Signs BP 116/78 (BP Location: Right Arm)   Pulse 82   Temp 98 F (36.7 C) (Oral)   Resp 20   SpO2 99%   Physical Exam  Constitutional: She appears well-developed and well-nourished. No distress.  HENT:  Head: Normocephalic and atraumatic.  Eyes: Conjunctivae are normal.  Neck: Neck supple.  Cardiovascular: Normal rate, regular rhythm and intact distal pulses.   Pulmonary/Chest: Effort normal.  Musculoskeletal:  Full range of motion in the right hand and fingers. Specifically, full range of motion in both joints of the right thumb, passively and actively against resistance.  Neurological: She is alert.  No sensory deficits.  Skin: Skin is warm and dry. Capillary refill takes less than 2 seconds. She is not diaphoretic.  2 cm, superficial laceration to the palmar right thumb. Wound edges spontaneously approximate.  Psychiatric: She has a normal mood and affect. Her behavior is normal.  Nursing note and vitals reviewed.    ED Treatments / Results  Labs (all labs ordered are listed, but only abnormal results are displayed) Labs Reviewed - No data to display  EKG  EKG Interpretation None       Radiology Dg Knee 1-2 Views Left  Result Date: 04/26/2016 CLINICAL DATA:  Bilateral knee pain for 4 months, no injury EXAM: LEFT KNEE - 1-2 VIEW COMPARISON:  None. FINDINGS: There is only mild degenerative joint disease of the left knee primarily involving the medial compartment where there is slightly more loss of joint  space. No fracture seen and no joint effusion is noted. IMPRESSION: Mild degenerative joint disease primarily involving the medial compartment. No acute abnormality. Electronically Signed   By: Ivar Drape M.D.   On: 04/26/2016 16:50   Dg Knee 1-2 Views Right  Result Date: 04/26/2016 CLINICAL DATA:  Bilateral knee pain right worse than left, no acute injury EXAM: RIGHT KNEE - 1-2 VIEW COMPARISON:  None. FINDINGS: The right knee joint spaces are relatively well preserved. No significant degenerative change  is seen. No spurring is noted. No fracture seen and there is no evidence of joint effusion. IMPRESSION: Negative right knee. Electronically Signed   By: Ivar Drape M.D.   On: 04/26/2016 16:51    Procedures Procedures (including critical care time)  Medications Ordered in ED Medications  bacitracin ointment ( Topical Given 04/27/16 1613)     Initial Impression / Assessment and Plan / ED Course  I have reviewed the triage vital signs and the nursing notes.  Pertinent labs & imaging results that were available during my care of the patient were reviewed by me and considered in my medical decision making (see chart for details).  Clinical Course    Patient presents with a superficial thumb laceration that occurred yesterday. I do not think that this wound would benefit from suturing. Wound care discussed.    Final Clinical Impressions(s) / ED Diagnoses   Final diagnoses:  Thumb laceration, right, initial encounter    New Prescriptions Discharge Medication List as of 04/27/2016  4:01 PM       Lorayne Bender, PA-C 04/27/16 [place], DO 04/27/16 2332

## 2016-04-27 NOTE — Discharge Instructions (Signed)
Keep the wound clean and dry. Apply antibiotic ointment such as Neosporin to help facilitate healing. Follow-up with your PCP as needed.

## 2016-04-27 NOTE — ED Triage Notes (Signed)
Pt states she cut right thumb on knife yesterday at work while washing dishes-slight lac noted-open-no bleeding-NAD

## 2016-06-14 ENCOUNTER — Encounter: Payer: Self-pay | Admitting: Internal Medicine

## 2016-06-14 ENCOUNTER — Ambulatory Visit (INDEPENDENT_AMBULATORY_CARE_PROVIDER_SITE_OTHER): Payer: BLUE CROSS/BLUE SHIELD | Admitting: Internal Medicine

## 2016-06-14 VITALS — BP 106/60 | HR 71 | Ht 64.0 in | Wt 209.4 lb

## 2016-06-14 DIAGNOSIS — J45991 Cough variant asthma: Secondary | ICD-10-CM | POA: Diagnosis not present

## 2016-06-14 MED ORDER — AZELASTINE-FLUTICASONE 137-50 MCG/ACT NA SUSP: 1.0000 | Freq: Two times a day (BID) | NASAL | 11 refills | Status: DC

## 2016-06-14 NOTE — Progress Notes (Signed)
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   History of Present Illness  02/04/2016 1st [facility] office visit/ Anita Benton   Chief Complaint  Patient presents with  . Pulmonary Consult    Referred by Dr. Emi Belfast. Pt states that she was dxed with Asthma in her 55's. She c/o increased cough and SOB for the past year. Her cough is prod at times with clear sputum. Cough seems to be worse when she eats and she sometimes gets choked. She states she is SOB "anytime" and sometimes wakes up with cough and SOB.     on symbicort 160 prior to worsening trend one year prior to OV   And uses saba 3 x daily and not night but symptoms also at hs - coughs so hard she vomits and pees on herself  Mucus is clear worse at hs and sinus symptoms flared about the same time her asthma did   Tessalon does not help cough/ saba only helps if uses neb form rec   schedule sinus CT > neg  Work on inhaler technique:   Change symbicort 80 Take 2 puffs first thing in am and then another 2 puffs about 12 hours later.  Ok for now to use nebulizer up to every 4 hours for cough or wheeze Prednisone 10 mg take  4 each am x 2 days,   2 each am x 2 days,  1 each am x 2 days and stop  Take delsym two tsp every 12 hours and supplement if needed with  oxycodone up to  every 4 hours Pantoprazole (protonix) 40 mg   Take  30-60 min before first meal of the day and Pepcid (famotidine)  20 mg one @  bedtime until return to office GERD diet    02/24/2016  f/u ov/Anita Benton re: cough x 20 y, worse x one year/ minimally improved on symb 80 2bid    Chief Complaint  Patient presents with  . Follow-up    Cough has improved some. She states still coughing up large amounts of yellow sputum in the am's.  Her breathing is much improved and she has not use rescue inhaler but uses neb every day.   cough tends to be worse around 3-4pm and overall only a little better than it was before intense rx with tramadol/ prednisone - not clear taking meds correctly as just ran out of tramadol sev days prior to OV  Whereas was  Supposed to take up to 12 / day to eliminate cylcical cough  >>stop symbicort   03/14/2016 NP Follow up : Chronic cough  Pt returns for 3 week follow up for cough .  Pt c/o prod cough with clear with pink mucus, sinus pressure/drainage, headaches. Has lots of sinus drainge and post nasal drip .   Previous spirometry was nml. Allergy labs showed IgE 43, RAST neg. , eos nml.  rec Add Zyrtec 10mg  At bedtime  As needed  Drainage .  Continue on Protonix and Pepcid . Continue on GERD diet.     06/14/2016  Final  f/u ov/Anita Benton re: cough x  73 y c/w uacs / ? Vasomotor rhnitis/ no asthma rx other than singulair (doesn't use neb)  Chief Complaint  Patient presents with  . Follow-up    Breathing is much improved. She is coughing less and sputum is clear. She states when she wakes up in the am her head is stopped up.    no better on zyrtec Nose runs worse at work but also at hs s wheeze and no sob or need for saba in any form   No obvious day to day or daytime variability or assoc   cp or chest tightness, subjective wheeze or overt   hb symptoms. No unusual exp hx or h/o childhood pna/ asthma or knowledge of premature birth.  Sleeping ok without nocturnal  or early am exacerbation  of respiratory  c/o's or need for noct saba. Also denies any obvious fluctuation of symptoms with weather or environmental changes or other aggravating or alleviating factors except as outlined above   Current Medications, Allergies, Complete Past Medical  History, Past Surgical History, Family History, and Social History were reviewed in [employer] record.  ROS  The following are not active complaints unless bolded sore throat, dysphagia, dental problems, itching, sneezing,  nasal congestion or excess/ purulent secretions, ear ache,   fever, chills, sweats, unintended wt loss, classically pleuritic or exertional cp, hemoptysis,  orthopnea pnd or leg swelling, presyncope, palpitations, abdominal pain, anorexia, nausea, vomiting, diarrhea  or change in bowel or bladder habits, change in stools or urine, dysuria,hematuria,  rash, arthralgias, visual complaints, headache, numbness, weakness or ataxia or problems with walking or coordination,  change in mood/affect or memory.                      Objective:   Physical Exam  amb bf nad    Wt Readings from Last 3 Encounters:  06/14/16 209 lb 6.4 oz (95 kg)  04/19/16 220 lb (99.8 kg)  03/14/16 214 lb (97.1 kg)    Vital signs reviewed - Note on arrival 02 sats  94% on RA        HEENT: nl dentition, and oropharynx which is pristine/ Nl external ear canals without cough reflex - mild to moderate bilateral non-specific turbinate edema  s excessive discharge    NECK :  without JVD/Nodes/TM/ nl carotid upstrokes bilaterally   LUNGS: no acc muscle use,  Nl contour chest which is clear to A and P bilaterally without cough on insp or exp maneuvers   CV:  RRR  no s3 or murmur or increase in P2, no edema   ABD:  soft and nontender with nl inspiratory excursion in the supine position. No bruits or organomegaly, bowel sounds nl  MS:  Nl gait/ ext warm without deformities, calf tenderness, cyanosis or clubbing No obvious joint restrictions   SKIN: warm and dry without lesions    NEURO:  alert, approp, nl sensorium with  no motor deficits       Outpatient Encounter Prescriptions as of 06/14/2016  Medication Sig  . albuterol (ACCUNEB) 1.25 MG/3ML nebulizer solution  Take 1 ampule by nebulization every 6 (six) hours as needed for wheezing.  Marland Kitchen atorvastatin (LIPITOR) 10 MG tablet Take 1 tablet (10 mg total) by mouth daily.  Marland Kitchen escitalopram (LEXAPRO) 10 MG tablet Take 1 tablet (10 mg total) by mouth daily.  . famotidine (PEPCID) 20 MG tablet One at bedtime  . hydrochlorothiazide (HYDRODIURIL) 25 MG tablet Take 1 tablet (25 mg total)  by mouth daily.  . montelukast (SINGULAIR) 10 MG tablet Take 1 tablet (10 mg total) by mouth at bedtime.  . MULTIPLE VITAMIN PO Take 1 tablet by mouth.  . pantoprazole (PROTONIX) 40 MG tablet Take 1 tablet (40 mg total) by mouth daily. Take 30-60 min before first meal of the day  . Respiratory Therapy Supplies (FLUTTER) DEVI Use as directed  . [DISCONTINUED] fluticasone (FLONASE) 50 MCG/ACT nasal spray Place 2 sprays into both nostrils daily.  . Azelastine-Fluticasone (DYMISTA) 137-50 MCG/ACT SUSP Place 1 spray into the nose 2 (two) times daily.   No facility-administered encounter medications on file as of 06/14/2016.

## 2016-06-14 NOTE — Assessment & Plan Note (Addendum)
02/04/2016  After extensive coaching HFA effectiveness =    75% > try lower symbicort to 80 2bid - cyclical cough rx 0000000 >>> min better - sinus ct 02/08/2016 > No paranasal sinus disease. - Spirometry 02/24/2016  Nl including mid flows> try off symb 80 and just use prn neb  - Allergy profile 02/24/2016 >  Eos 0.1 /  IgE 43 neg rast> trial of singulair  - 02/24/2016 added flutter valve   - trial of 1st gen H1 and continue siingulair, add dymista one bid and f/u prn as all symptoms upper airway and f/u can be parn   No evidence of asthma here and better on present rx x for persistant sinus symptoms in the absence of signifianct obj findings so reasonable to try dymsta and f/u with allergy prn   I had an extended final summary discussion with the patient reviewing all relevant studies completed to date and  lasting 15 to 20 minutes of a 25 minute visit on the following issues:    Each maintenance medication was reviewed in detail including most importantly the difference between maintenance and as needed and under what circumstances the prns are to be used.  Please see instructions for details which were reviewed in writing and the patient given a copy.

## 2016-06-14 NOTE — Patient Instructions (Signed)
dymista one twice daily in place of the flonase   If you are satisfied with your treatment plan,  let your doctor know and he/she can either refill your medications or you can return here when your prescription runs out.     If in any way you are not 100% satisfied,  please tell us.  If 100% better, tell your friends!  Pulmonary follow up is as needed

## 2016-06-19 ENCOUNTER — Encounter: Payer: Self-pay | Admitting: Internal Medicine

## 2016-07-01 ENCOUNTER — Other Ambulatory Visit: Payer: Self-pay | Admitting: Internal Medicine

## 2016-07-01 DIAGNOSIS — J45991 Cough variant asthma: Secondary | ICD-10-CM

## 2016-07-07 IMAGING — US US EXTREM LOW VENOUS BILAT
1 series · 13 of 24 positions shown · non-contrast
Comparison: 04/16/2013

CLINICAL DATA: Lower extremity pain and edema. Recent right hip
surgery. History of bilateral saphenous vein stripping and DVT.



[Series 1: us extrem low venous bilat · 13 of 36 slices shown]
[im 1/36]
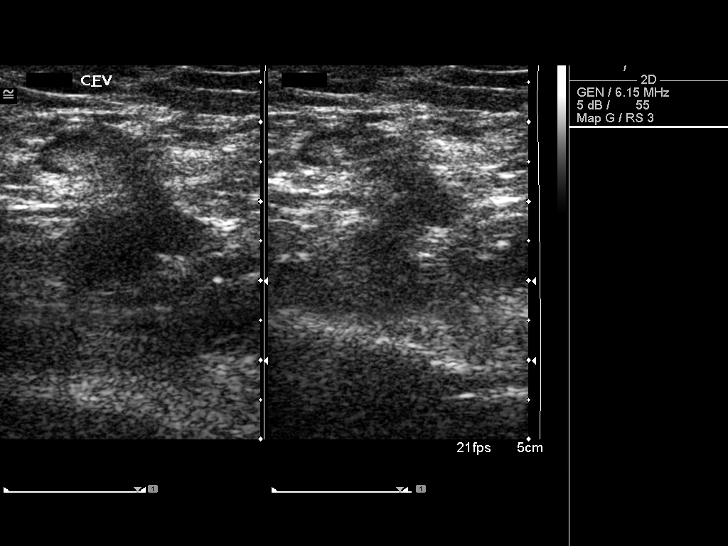
[im 4/36]
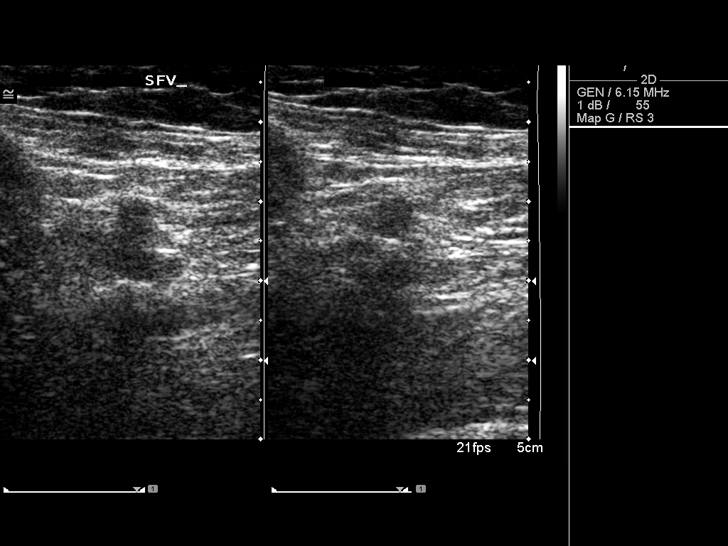
[im 7/36]
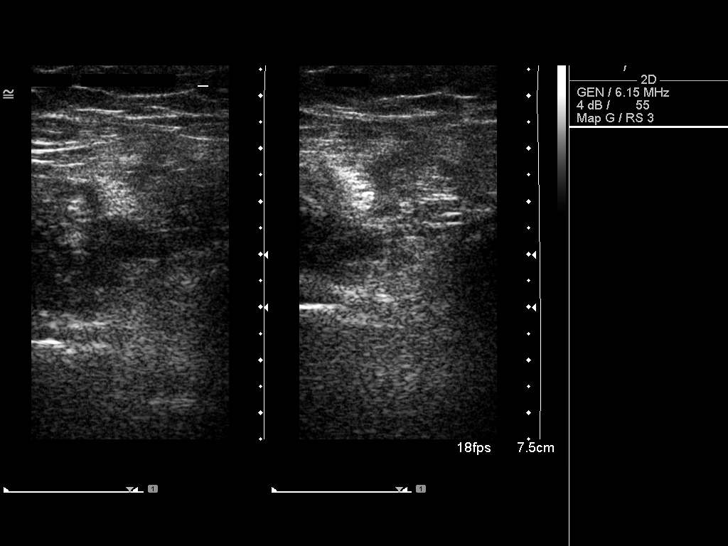
[im 10/36]
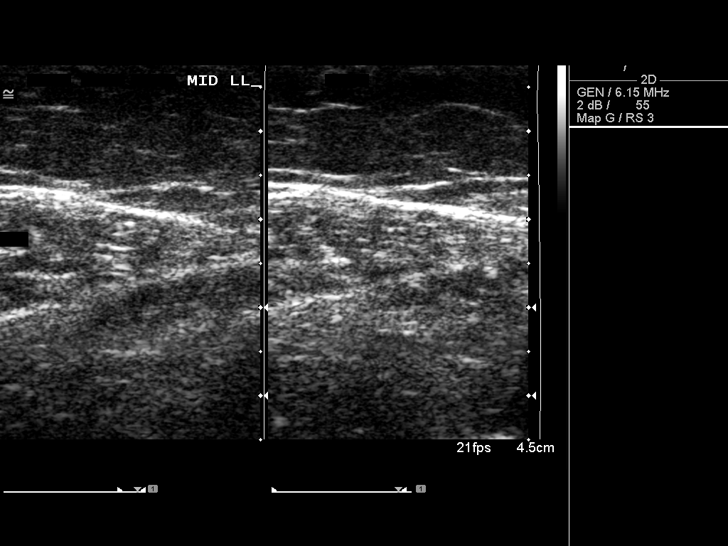
[im 13/36]
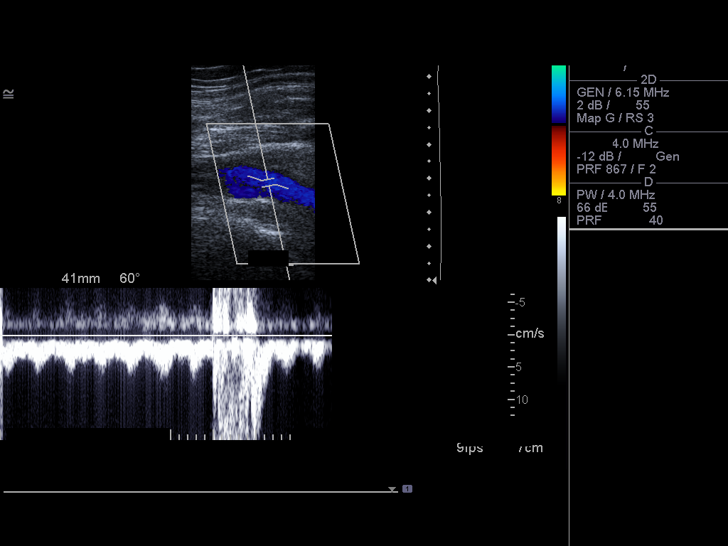
[im 16/36]
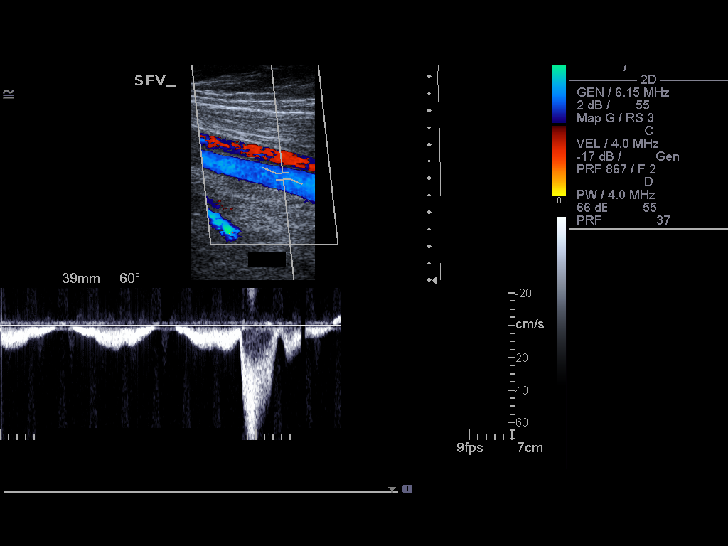
[im 19/36]
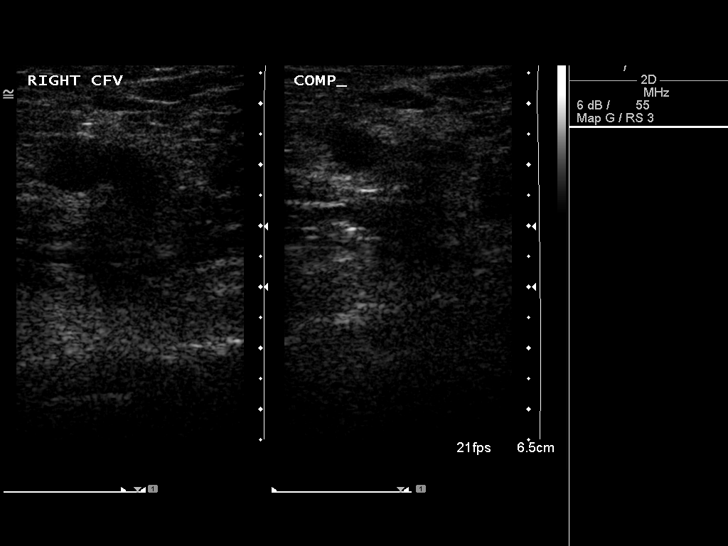
[im 20/36]
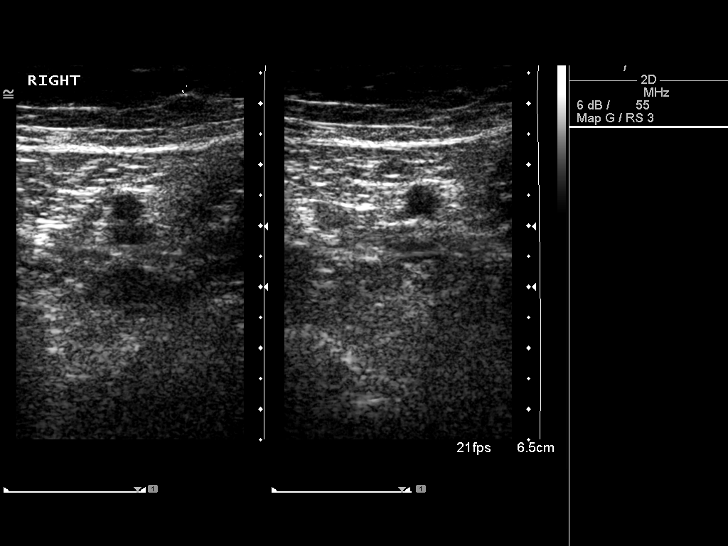
[im 23/36]
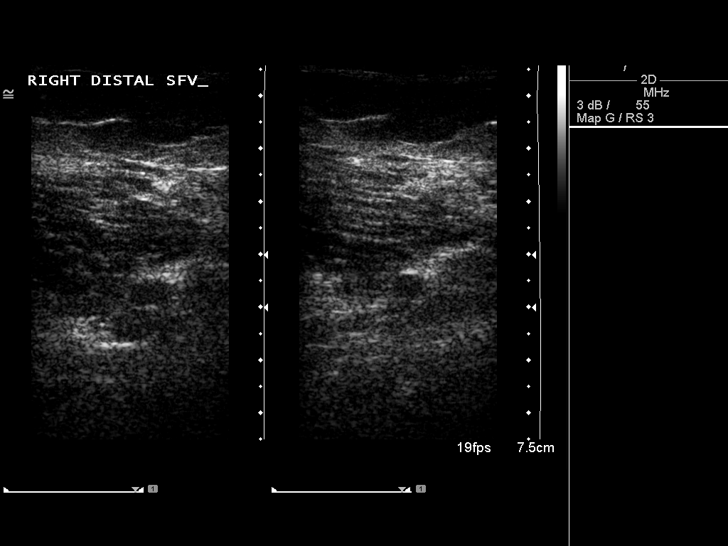
[im 26/36]
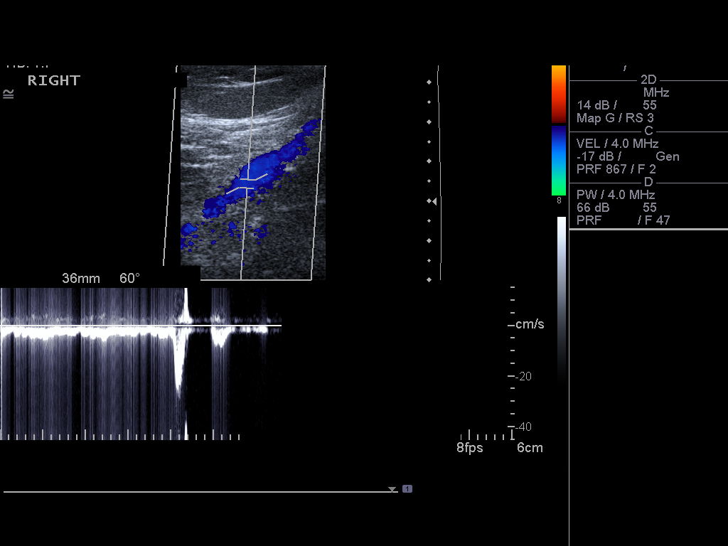
[im 29/36]
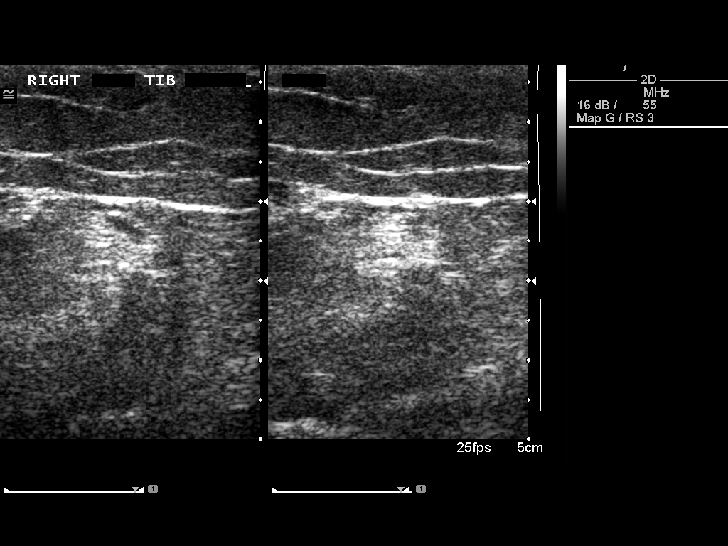
[im 32/36]
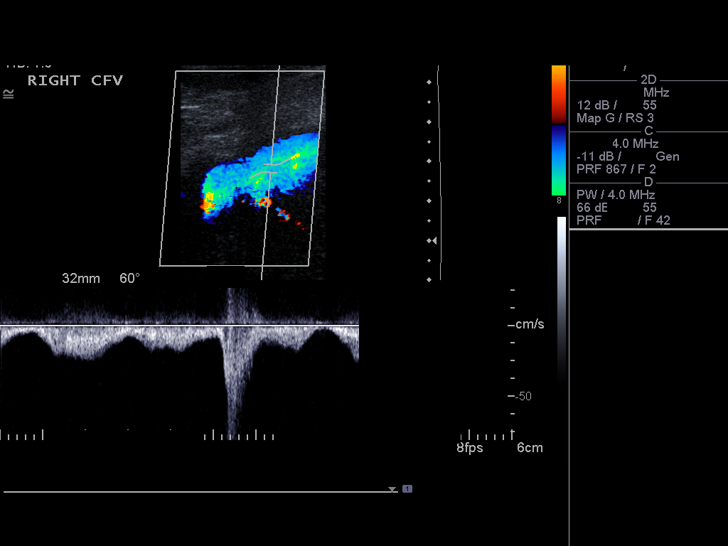
[im 36/36]
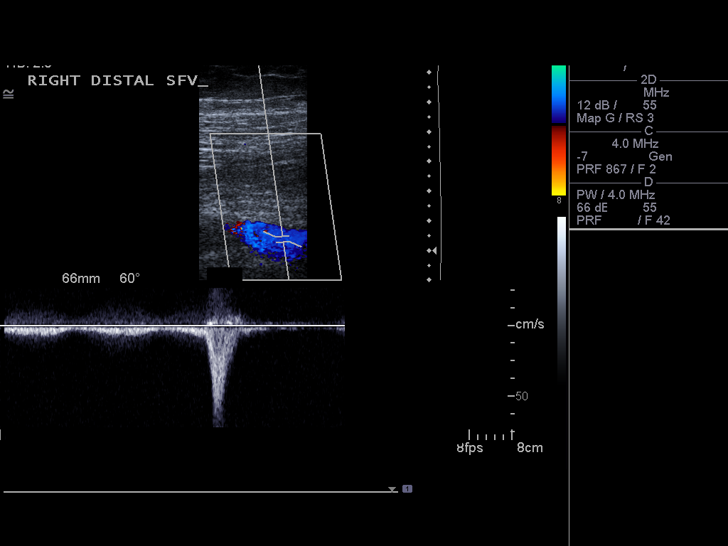

[13 of 24 positions shown; findings below may reference images not displayed]

FINDINGS: RIGHT LOWER EXTREMITY

Common Femoral Vein: No evidence of thrombus. Normal
compressibility, respiratory phasicity and response to augmentation.

Saphenofemoral Junction: No evidence of thrombus. Normal
compressibility and flow on color Doppler imaging.

Profunda Femoral Vein: No evidence of thrombus. Normal
compressibility and flow on color Doppler imaging.

Femoral Vein: No evidence of thrombus. Normal compressibility,
respiratory phasicity and response to augmentation.

Popliteal Vein: No evidence of thrombus. Normal compressibility,
respiratory phasicity and response to augmentation.

Calf Veins: No evidence of thrombus. Normal compressibility and flow
on color Doppler imaging.

Superficial Great Saphenous Vein: No evidence of thrombus. Normal
compressibility and flow on color Doppler imaging.

Venous Reflux:  None.

Other Findings: No evidence of superficial thrombophlebitis or
abnormal fluid collection.

LEFT LOWER EXTREMITY

Common Femoral Vein: No evidence of thrombus. Normal
compressibility, respiratory phasicity and response to augmentation.

Saphenofemoral Junction: No evidence of thrombus. Normal
compressibility and flow on color Doppler imaging.

Profunda Femoral Vein: No evidence of thrombus. Normal
compressibility and flow on color Doppler imaging.

Femoral Vein: No evidence of thrombus. Normal compressibility,
respiratory phasicity and response to augmentation.

Popliteal Vein: No evidence of thrombus. Normal compressibility,
respiratory phasicity and response to augmentation.

Calf Veins: No evidence of thrombus. Normal compressibility and flow
on color Doppler imaging.

Superficial Great Saphenous Vein: No evidence of thrombus. Normal
compressibility and flow on color Doppler imaging.

Venous Reflux:  None.

Other Findings: No evidence of superficial thrombophlebitis or
abnormal fluid collection.
IMPRESSION: No evidence of bilateral lower extremity deep venous thrombosis.

## 2016-09-16 ENCOUNTER — Emergency Department (HOSPITAL_BASED_OUTPATIENT_CLINIC_OR_DEPARTMENT_OTHER): Payer: BLUE CROSS/BLUE SHIELD

## 2016-09-16 ENCOUNTER — Emergency Department (HOSPITAL_BASED_OUTPATIENT_CLINIC_OR_DEPARTMENT_OTHER)
Admission: EM | Admit: 2016-09-16 | Discharge: 2016-09-16 | Disposition: A | Discharge status: Home / Self Care | Payer: BLUE CROSS/BLUE SHIELD | Attending: Physician Assistant | Admitting: Physician Assistant

## 2016-09-16 ENCOUNTER — Encounter (HOSPITAL_BASED_OUTPATIENT_CLINIC_OR_DEPARTMENT_OTHER): Payer: Self-pay | Admitting: *Deleted

## 2016-09-16 DIAGNOSIS — J189 Pneumonia, unspecified organism: Secondary | ICD-10-CM | POA: Diagnosis not present

## 2016-09-16 DIAGNOSIS — I10 Essential (primary) hypertension: Secondary | ICD-10-CM | POA: Diagnosis not present

## 2016-09-16 DIAGNOSIS — R05 Cough: Secondary | ICD-10-CM | POA: Diagnosis present

## 2016-09-16 LAB — CBC WITH DIFFERENTIAL/PLATELET
Basophils Absolute: 0 10*3/uL (ref 0.0–0.1)
Basophils Relative: 0 %
EOS PCT: 2 %
Eosinophils Absolute: 0.1 10*3/uL (ref 0.0–0.7)
HCT: 41.2 % (ref 36.0–46.0)
HEMOGLOBIN: 14.1 g/dL (ref 12.0–15.0)
LYMPHS ABS: 2.8 10*3/uL (ref 0.7–4.0)
LYMPHS PCT: 45 %
MCH: 30.4 pg (ref 26.0–34.0)
MCHC: 34.2 g/dL (ref 30.0–36.0)
MCV: 88.8 fL (ref 78.0–100.0)
MONOS PCT: 7 %
Monocytes Absolute: 0.4 10*3/uL (ref 0.1–1.0)
NEUTROS ABS: 2.9 10*3/uL (ref 1.7–7.7)
Neutrophils Relative %: 46 %
Platelets: 236 10*3/uL (ref 150–400)
RBC: 4.64 MIL/uL (ref 3.87–5.11)
RDW: 13.6 % (ref 11.5–15.5)
WBC: 6.2 10*3/uL (ref 4.0–10.5)

## 2016-09-16 LAB — I-STAT CG4 LACTIC ACID, ED: Lactic Acid, Venous: 1.13 mmol/L (ref 0.5–1.9)

## 2016-09-16 LAB — COMPREHENSIVE METABOLIC PANEL
ALBUMIN: 3.4 g/dL — AB (ref 3.5–5.0)
ALT: 26 U/L (ref 14–54)
ANION GAP: 8 (ref 5–15)
AST: 45 U/L — AB (ref 15–41)
Alkaline Phosphatase: 75 U/L (ref 38–126)
BUN: 13 mg/dL (ref 6–20)
CHLORIDE: 102 mmol/L (ref 101–111)
CO2: 28 mmol/L (ref 22–32)
Calcium: 8.6 mg/dL — ABNORMAL LOW (ref 8.9–10.3)
Creatinine, Ser: 0.82 mg/dL (ref 0.44–1.00)
GFR calc Af Amer: >60 mL/min (ref >60)
GFR calc non Af Amer: >60 mL/min (ref >60)
GLUCOSE: 93 mg/dL (ref 65–99)
POTASSIUM: 3.9 mmol/L (ref 3.5–5.1)
Sodium: 138 mmol/L (ref 135–145)
Total Bilirubin: 0.4 mg/dL (ref 0.3–1.2)
Total Protein: 7.5 g/dL (ref 6.5–8.1)

## 2016-09-16 MED ORDER — HYDROCODONE-GUAIFENESIN 2.5-200 MG/5ML PO SOLN: 5.0000 mg | Freq: Four times a day (QID) | ORAL | 0 refills | Status: AC | PRN

## 2016-09-16 MED ORDER — IPRATROPIUM-ALBUTEROL 0.5-2.5 (3) MG/3ML IN SOLN
3.0000 mL | Freq: Once | RESPIRATORY_TRACT | Status: AC
  Administered 2016-09-16: 3 mL via RESPIRATORY_TRACT
  Filled 2016-09-16: qty 3

## 2016-09-16 MED ORDER — SODIUM CHLORIDE 0.9 % IV BOLUS (SEPSIS): 1000.0000 mL | Freq: Once | INTRAVENOUS | Status: DC

## 2016-09-16 MED ORDER — DOXYCYCLINE HYCLATE 100 MG IV SOLR
100.0000 mg | Freq: Once | INTRAVENOUS | Status: AC
Start: 2016-09-16 — End: 2016-09-16
  Administered 2016-09-16: 100 mg via INTRAVENOUS
  Filled 2016-09-16: qty 100

## 2016-09-16 MED ORDER — PREDNISONE 50 MG PO TABS
60.0000 mg | ORAL_TABLET | Freq: Once | ORAL | Status: AC
  Administered 2016-09-16: 60 mg via ORAL
  Filled 2016-09-16: qty 1

## 2016-09-16 MED ORDER — GUAIFENESIN-CODEINE 100-10 MG/5ML PO SOLN: 5.0000 mL | Freq: Four times a day (QID) | ORAL | 0 refills | Status: DC | PRN

## 2016-09-16 MED ORDER — PREDNISONE 10 MG PO TABS: 10.0000 mg | ORAL_TABLET | Freq: Every day | ORAL | 0 refills | Status: DC

## 2016-09-16 MED ORDER — DOXYCYCLINE HYCLATE 100 MG PO CAPS: 100.0000 mg | ORAL_CAPSULE | Freq: Two times a day (BID) | ORAL | 0 refills | Status: DC

## 2016-09-16 MED ORDER — ACETAMINOPHEN 325 MG PO TABS
650.0000 mg | ORAL_TABLET | Freq: Once | ORAL | Status: AC
  Administered 2016-09-16: 650 mg via ORAL
  Filled 2016-09-16: qty 2

## 2016-09-16 MED ORDER — DOXYCYCLINE HYCLATE 100 MG IV SOLR
INTRAVENOUS | Status: AC
  Filled 2016-09-16: qty 100

## 2016-09-16 MED ORDER — ALBUTEROL SULFATE (2.5 MG/3ML) 0.083% IN NEBU
2.5000 mg | INHALATION_SOLUTION | Freq: Once | RESPIRATORY_TRACT | Status: AC
  Administered 2016-09-16: 2.5 mg via RESPIRATORY_TRACT
  Filled 2016-09-16: qty 3

## 2016-09-16 NOTE — ED Notes (Signed)
Alert, NAD, calm, interactive, resps e/u, speaking in clear complete sentences, no dyspnea noted, skin W&D, VSS, (denies: pain, sob, nausea, dizziness or visual changes). Congested cough noted, unable to cough anything up, "feel better, breathing easier" (after neb), LS with expiratory crackles and wheezes.

## 2016-09-16 NOTE — ED Provider Notes (Signed)
[facility] Provider Note   CSN: [id number] Arrival date & time: 09/16/16  1324  By signing my name below, I, Jeanell Sparrow, attest that this documentation has been prepared under the direction and in the presence of Courteney Julio Alm, MD. Electronically Signed: Jeanell Sparrow, Scribe. 09/16/2016. 3:44 PM.  History   Chief Complaint Chief Complaint  Patient presents with  . Influenza   The history is provided by the patient. No language interpreter was used.   HPI Comments: Anita Benton is a 61 y.o. female with a PMHx of asthma who presents to the Emergency Department complaining of intermittent moderate cough that started about a week ago. She states she was diagnosed with influenza 4 days ago, returned to PCP 2 days ago, and discharged with cough syrup and tamiflu without relief. She came to the ED for unchanged progression. She has been taking ibuprofen, tylenol, and albuterol nebulizer with some relief. She reports associated subjective fever. Pt's temperature in the ED today was 98.2. She had an influenza vaccination this year. She denies any myalgias or other complaints.     PCP: Kelton Pillar, MD  Past Medical History:  Diagnosis Date  . Allergy   . Arthritis    "ankles; right hip"  . GERD (gastroesophageal reflux disease)   . Hyperlipidemia   . Itching   . Keloid    "q where; I have keloid skin"  . Menopause   . Migraines 04/01/2012  . Right leg DVT ([place]) 1980's  . Shingles 04/01/2012   "have them q year; usually in Nov; for the last 20 years"  . Shortness of breath 04/01/2012   "when my asthma acts up"  . Stroke [facility]) 1977; 04/01/2012   denies residual; "from BCP RX"; little weak right arm; right face little numb"  . Vitamin D deficiency     Patient Active Problem List   Diagnosis Date Noted  . Cough variant asthma with component of UACS 02/04/2016  . Edema 04/29/2013  . Foot swelling 10/21/2012  . Low back pain 08/09/2012  . URI, acute  08/05/2012  . Cough 08/05/2012  . Left hip pain 08/05/2012  . Zoster 07/02/2012  . Right hip pain 05/23/2012  . Herniated disc, cervical 04/09/2012  . Headache, hemiplegic migraine 04/04/2012  . Gait abnormality 04/03/2012  . HTN (hypertension) 04/01/2012  . Finger pain, right 04/01/2012  . Finger, open wounds, complicated [id number]  . Varicose veins 06/27/2011  . Urinary tract infection, site not specified 06/27/2011  . Nevus 06/27/2011  . DVT, lower extremity ([place]) 06/13/2011  . Arthritis   . Asthma in adult   . Hyperlipidemia   . Shingles   . Vitamin D deficiency   . Allergy   . Keloid   . Menopause     Past Surgical History:  Procedure Laterality Date  . ABDOMINAL HYSTERECTOMY  1980's  . [place]  . FINGER SURGERY  10/09/14  . TOTAL HIP ARTHROPLASTY Right 09-21-15  . VARICOSE VEIN SURGERY  1970's   bilaterally    OB History    Gravida Para Term Preterm AB Living   4 3     1      SAB TAB Ectopic Multiple Live Births     1             Home Medications    Prior to Admission medications   Medication Sig Start Date End Date Taking? Authorizing Provider  albuterol (ACCUNEB) 1.25 MG/3ML nebulizer solution Take 1 ampule  by nebulization every 6 (six) hours as needed for wheezing.    Historical Provider, MD  atorvastatin (LIPITOR) 10 MG tablet Take 1 tablet (10 mg total) by mouth daily. 02/09/15   Alferd Apa Lowne Chase, DO  Azelastine-Fluticasone (DYMISTA) 137-50 MCG/ACT SUSP Place 1 spray into the nose 2 (two) times daily. 06/14/16   Tanda Rockers, MD  escitalopram (LEXAPRO) 10 MG tablet Take 1 tablet (10 mg total) by mouth daily. 02/09/15   Rosalita Chessman Chase, DO  famotidine (PEPCID) 20 MG tablet TAKE 1 TABLET BY MOUTH AT BEDTIME 07/03/16   Tanda Rockers, MD  hydrochlorothiazide (HYDRODIURIL) 25 MG tablet Take 1 tablet (25 mg total) by mouth daily. 02/09/15   Alferd Apa Lowne Chase, DO  montelukast (SINGULAIR) 10 MG tablet Take 1 tablet (10 mg  total) by mouth at bedtime. 02/09/15   Rosalita Chessman Chase, DO  MULTIPLE VITAMIN PO Take 1 tablet by mouth.    Historical Provider, MD  pantoprazole (PROTONIX) 40 MG tablet TAKE 1 TABLET (40 MG TOTAL) BY MOUTH DAILY. TAKE 30-60 MIN BEFORE FIRST MEAL OF THE DAY 07/03/16   Tanda Rockers, MD  Respiratory Therapy Supplies (FLUTTER) DEVI Use as directed 02/24/16   Tanda Rockers, MD    Family History Family History  Problem Relation Age of Onset  . Lung cancer Mother     smoked  . Hyperlipidemia Mother   . Hypertension Mother   . Bone cancer Father   . Sudden death Neg Hx   . Diabetes Neg Hx   . Heart attack Neg Hx     Social History Social History  Substance Use Topics  . Smoking status: Never Smoker  . Smokeless tobacco: Never Used  . Alcohol use No     Allergies   Codeine; Levofloxacin; and Rice   Review of Systems Review of Systems  Constitutional: Positive for fever (Subjective).  Respiratory: Positive for cough.   Musculoskeletal: Negative for myalgias.  All other systems reviewed and are negative.    Physical Exam Updated Vital Signs BP 129/79 (BP Location: Left Arm)   Pulse 95   Temp 98.2 F (36.8 C) (Oral)   Resp 21   Ht 5\' 4"  (1.626 m)   Wt 209 lb (94.8 kg)   SpO2 95%   BMI 35.87 kg/m   Physical Exam  Constitutional: She appears well-developed and well-nourished. No distress.  HENT:  Head: Normocephalic.  Mouth/Throat: No posterior oropharyngeal edema or posterior oropharyngeal erythema.  Eyes: Conjunctivae are normal.  Neck: Neck supple.  Cardiovascular: Normal rate and regular rhythm.   Pulmonary/Chest: Effort normal. No respiratory distress. She has no wheezes. She has no rales.  Bilateral rhonchi, left greater than right. No increased work of breathing.   Abdominal: Soft.  Musculoskeletal: Normal range of motion.  Neurological: She is alert.  Skin: Skin is warm and dry.  Psychiatric: She has a normal mood and affect.  Nursing note and  vitals reviewed.    ED Treatments / Results  DIAGNOSTIC STUDIES: Oxygen Saturation is 95% on RA, normal by my interpretation.    COORDINATION OF CARE: 3:48 PM- Pt advised of plan for treatment and pt agrees.  Labs (all labs ordered are listed, but only abnormal results are displayed) Labs Reviewed  COMPREHENSIVE METABOLIC PANEL - Abnormal; Notable for the following:       Result Value   Calcium 8.6 (*)    Albumin 3.4 (*)    AST 45 (*)    All  other components within normal limits  CBC WITH DIFFERENTIAL/PLATELET  I-STAT CG4 LACTIC ACID, ED    EKG  EKG Interpretation None       Radiology Dg Chest 2 View  Result Date: 09/16/2016 CLINICAL DATA:  61 year old female with cough congestion shortness of breath and left rib pain from coughing. Symptoms for 1 week. On Tamiflu. Initial encounter. EXAM: CHEST  2 VIEW COMPARISON:  01/06/2016 chest radiographs and earlier. FINDINGS: Abnormal nodular and confluent left perihilar upper lobe and lingula opacity (arrows). For the most part this has an inflammatory appearance particularly on the frontal view. There might also be left lower lobe involvement. There is no pleural effusion identified. Mediastinal contours are stable. Visualized tracheal air column is within normal limits. There is subtle patchy opacity in the right lung lateral to the hilum. Otherwise the right lung appears stable in clear. No pneumothorax or edema. Small chronic retained round BB type foreign body projecting at the right neck. No acute osseous abnormality identified. Negative visible bowel gas pattern. IMPRESSION: 1. New left lung multilobar opacity most suspicious for multilobar infection. Possible early involvement of the right lung. No pleural effusions. 2. Followup PA and lateral chest X-ray is recommended in 3-4 weeks following trial of antibiotic therapy to ensure resolution and exclude underlying malignancy. Electronically Signed   By: Genevie Ann M.D.   On: 09/16/2016  13:55    Procedures Procedures (including critical care time)  Medications Ordered in ED Medications  sodium chloride 0.9 % bolus 1,000 mL (0 mLs Intravenous Stopped 09/16/16 1850)  doxycycline (VIBRAMYCIN) 100 MG injection (  Not Given 09/16/16 1611)  predniSONE (DELTASONE) tablet 60 mg (not administered)  albuterol (PROVENTIL) (2.5 MG/3ML) 0.083% nebulizer solution 2.5 mg (2.5 mg Nebulization Given 09/16/16 1531)  ipratropium-albuterol (DUONEB) 0.5-2.5 (3) MG/3ML nebulizer solution 3 mL (3 mLs Nebulization Given 09/16/16 1531)  doxycycline (VIBRAMYCIN) 100 mg in dextrose 5 % 250 mL IVPB (0 mg Intravenous Stopped 09/16/16 1821)  acetaminophen (TYLENOL) tablet 650 mg (650 mg Oral Given 09/16/16 1717)     Initial Impression / Assessment and Plan / ED Course  I have reviewed the triage vital signs and the nursing notes.  Pertinent labs & imaging results that were available during my care of the patient were reviewed by me and considered in my medical decision making (see chart for details).    I personally performed the services described in this documentation, which was scribed in my presence. The recorded information has been reviewed and is accurate.   Patient is a 61 year old female who was diagnosed with flu earlier this week. Patient had 2 doctor visits for the same. Patient notes increased coughing. Patient reports she has inhalers at home. Patient has been able to keep fluids down. She reports intermittent fevers. She reports that her aches have gotten better.  Patient's chest x-ray shows pneumonia. We will treat for communuity acquired pneumonia with Doxyclyine. Patient not requiring any oxygen at this time. On physical exam patient does not have any wheezing. However she received a DuoNeb prior to my exam.  8:25 PM Patient appears much improved. She is not requiring oxygen. She ambulated without issue. Patient would like to go home. Patient was wondering whether she could have  steroids since that helped in the past. I think that is reasonable.. We will give her treatment for community acquired pneumonia plus steroids. She has inhalers at home. Will give strict return  precautions were expressed the patient that she needs to return with any  increased shortness of breath.    Final Clinical Impressions(s) / ED Diagnoses   Final diagnoses:  None    New Prescriptions New Prescriptions   No medications on file     Courteney Julio Alm, MD 09/16/16 2025

## 2016-09-16 NOTE — ED Notes (Signed)
Ambulated in hall on room air Hr 90-100, RR 20-26 (coughing*) SpO2 93-95%.

## 2016-09-16 NOTE — ED Notes (Signed)
Pt teaching provided on medications that may cause drowsiness. Pt instructed not to drive or operate heavy machinery while taking the prescribed medication. Pt verbalized understanding.   

## 2016-09-16 NOTE — ED Triage Notes (Signed)
Pt dx with the flu on Tuesday, states that she went back to the PCP on Thursday and got some cough syrup and tamiflu-states that her symptoms are not improving.  Ambulatory.  Cough noted in triage.

## 2016-09-16 NOTE — ED Notes (Signed)
EDP into room 

## 2016-09-16 NOTE — Discharge Instructions (Signed)
Please return immediately if you have increased shortness of breath trouble breathing, trouble staying hydrated or other concerns.

## 2016-10-09 ENCOUNTER — Other Ambulatory Visit: Payer: Self-pay | Admitting: Internal Medicine

## 2016-10-09 DIAGNOSIS — J45991 Cough variant asthma: Secondary | ICD-10-CM

## 2016-10-25 ENCOUNTER — Other Ambulatory Visit: Payer: Self-pay | Admitting: Internal Medicine

## 2016-10-25 ENCOUNTER — Ambulatory Visit
Admission: RE | Admit: 2016-10-25 | Discharge: 2016-10-25 | Disposition: A | Discharge status: Home / Self Care | Payer: BLUE CROSS/BLUE SHIELD | Source: Ambulatory Visit | Attending: Internal Medicine | Admitting: Internal Medicine

## 2016-10-25 DIAGNOSIS — J189 Pneumonia, unspecified organism: Secondary | ICD-10-CM

## 2016-10-25 DIAGNOSIS — J181 Lobar pneumonia, unspecified organism: Principal | ICD-10-CM

## 2016-10-25 DIAGNOSIS — Z1231 Encounter for screening mammogram for malignant neoplasm of breast: Secondary | ICD-10-CM

## 2016-11-13 ENCOUNTER — Ambulatory Visit: Payer: Self-pay

## 2016-11-14 ENCOUNTER — Ambulatory Visit
Admission: RE | Admit: 2016-11-14 | Discharge: 2016-11-14 | Disposition: A | Discharge status: Home / Self Care | Payer: BLUE CROSS/BLUE SHIELD | Source: Ambulatory Visit | Attending: Internal Medicine | Admitting: Internal Medicine

## 2016-11-14 DIAGNOSIS — Z1231 Encounter for screening mammogram for malignant neoplasm of breast: Secondary | ICD-10-CM

## 2017-01-06 ENCOUNTER — Other Ambulatory Visit: Payer: Self-pay | Admitting: Internal Medicine

## 2017-01-06 DIAGNOSIS — J45991 Cough variant asthma: Secondary | ICD-10-CM

## 2017-01-08 ENCOUNTER — Telehealth: Payer: Self-pay | Admitting: Internal Medicine

## 2017-01-08 NOTE — Telephone Encounter (Signed)
Spoke with pt. She is needing a refill on Protonix. This was denied by our office due that she is only supposed to follow up as needed with MW. Her PCP would be responsible for this refill per MW. Pt states that she will contact her PCP. Nothing further was needed.

## 2017-06-12 ENCOUNTER — Telehealth: Payer: Self-pay | Admitting: Hematology and Oncology

## 2017-06-12 ENCOUNTER — Encounter: Payer: Self-pay | Admitting: Hematology and Oncology

## 2017-06-12 NOTE — Telephone Encounter (Signed)
Received a call from [place] at [facility] at [place] to schedule the pt to see a hematologist. Appt has been scheduled for the pt to see Dr. Lindi Adie on 11/26 at [place] will notify the pt. Letter mailed.

## 2017-06-25 ENCOUNTER — Telehealth: Payer: Self-pay | Admitting: Hematology and Oncology

## 2017-06-25 ENCOUNTER — Ambulatory Visit (HOSPITAL_BASED_OUTPATIENT_CLINIC_OR_DEPARTMENT_OTHER): Payer: BLUE CROSS/BLUE SHIELD | Admitting: Hematology and Oncology

## 2017-06-25 ENCOUNTER — Ambulatory Visit (HOSPITAL_BASED_OUTPATIENT_CLINIC_OR_DEPARTMENT_OTHER): Payer: BLUE CROSS/BLUE SHIELD

## 2017-06-25 VITALS — BP 130/90 | HR 96 | Temp 97.6°F | Resp 18 | Ht 64.0 in | Wt 208.7 lb

## 2017-06-25 DIAGNOSIS — I82409 Acute embolism and thrombosis of unspecified deep veins of unspecified lower extremity: Secondary | ICD-10-CM

## 2017-06-25 DIAGNOSIS — I82431 Acute embolism and thrombosis of right popliteal vein: Secondary | ICD-10-CM

## 2017-06-25 DIAGNOSIS — Z801 Family history of malignant neoplasm of trachea, bronchus and lung: Secondary | ICD-10-CM

## 2017-06-25 DIAGNOSIS — Z808 Family history of malignant neoplasm of other organs or systems: Secondary | ICD-10-CM

## 2017-06-25 NOTE — Progress Notes (Signed)
[facility] CONSULT NOTE  Patient Care Team: Lanice Shirts, MD as PCP - General (Internal Medicine)  CHIEF COMPLAINTS/PURPOSE OF CONSULTATION:  Recurrent DVTs  HISTORY OF PRESENTING ILLNESS:  Anita Benton 61 y.o. female is here because of recent diagnosis of recurrent DVTs.  First blood pressure was in 1980 when she had vein stripping surgery and had bilateral blood clots.  She was not treated with any anticoagulation according to her.  Most recently she underwent knee surgery and postoperatively she developed a blood clot.  She was given aspirin before and after the procedure.  She was started on Eliquis and she was sent to [place] for discussion regarding inherited/other acquired hypercoagulability risk factors.  I reviewed her records extensively and collaborated the history with the patient.   MEDICAL HISTORY:  Past Medical History:  Diagnosis Date  . Allergy   . Arthritis    "ankles; right hip"  . GERD (gastroesophageal reflux disease)   . Hyperlipidemia   . Itching   . Keloid    "q where; I have keloid skin"  . Menopause   . Migraines 04/01/2012  . Right leg DVT ([place]) 1980's  . Shingles 04/01/2012   "have them q year; usually in Nov; for the last 20 years"  . Shortness of breath 04/01/2012   "when my asthma acts up"  . Stroke [facility]) 1977; 04/01/2012   denies residual; "from BCP RX"; little weak right arm; right face little numb"  . Vitamin D deficiency     SURGICAL HISTORY: Past Surgical History:  Procedure Laterality Date  . ABDOMINAL HYSTERECTOMY  1980's  . [place]  . FINGER SURGERY  10/09/14  . TOTAL HIP ARTHROPLASTY Right 09-21-15  . VARICOSE VEIN SURGERY  1970's   bilaterally    SOCIAL HISTORY: Social History   Socioeconomic History  . Marital status: Divorced    Spouse name: Not on file  . Number of children: Not on file  . Years of education: Not on file  . Highest education level: Not on file  Social Needs   . Financial resource strain: Not on file  . Food insecurity - worry: Not on file  . Food insecurity - inability: Not on file  . Transportation needs - medical: Not on file  . Transportation needs - non-medical: Not on file  Occupational History  . Not on file  Tobacco Use  . Smoking status: Never Smoker  . Smokeless tobacco: Never Used  Substance and Sexual Activity  . Alcohol use: No  . Drug use: No  . Sexual activity: Not on file  Other Topics Concern  . Not on file  Social History Narrative  . Not on file    FAMILY HISTORY: Family History  Problem Relation Age of Onset  . Lung cancer Mother        smoked  . Hyperlipidemia Mother   . Hypertension Mother   . Bone cancer Father   . Breast cancer Cousin   . Sudden death Neg Hx   . Diabetes Neg Hx   . Heart attack Neg Hx     ALLERGIES:  is allergic to codeine; levofloxacin; and rice.  MEDICATIONS:  Current Outpatient Medications  Medication Sig Dispense Refill  . albuterol (ACCUNEB) 1.25 MG/3ML nebulizer solution Take 1 ampule by nebulization every 6 (six) hours as needed for wheezing.    Marland Kitchen atorvastatin (LIPITOR) 10 MG tablet Take 1 tablet (10 mg total) by mouth daily. 90 tablet 1  .  Azelastine-Fluticasone (DYMISTA) 137-50 MCG/ACT SUSP Place 1 spray into the nose 2 (two) times daily. 1 Bottle 11  . doxycycline (VIBRAMYCIN) 100 MG capsule Take 1 capsule (100 mg total) by mouth 2 (two) times daily. 20 capsule 0  . escitalopram (LEXAPRO) 10 MG tablet Take 1 tablet (10 mg total) by mouth daily. 90 tablet 1  . famotidine (PEPCID) 20 MG tablet TAKE 1 TABLET BY MOUTH AT BEDTIME 90 tablet 0  . guaiFENesin-codeine 100-10 MG/5ML syrup Take 5 mLs by mouth every 6 (six) hours as needed for cough. 120 mL 0  . hydrochlorothiazide (HYDRODIURIL) 25 MG tablet Take 1 tablet (25 mg total) by mouth daily. 90 tablet 1  . montelukast (SINGULAIR) 10 MG tablet Take 1 tablet (10 mg total) by mouth at bedtime. 90 tablet 1  . MULTIPLE VITAMIN PO  Take 1 tablet by mouth.    . pantoprazole (PROTONIX) 40 MG tablet TAKE 1 TABLET (40 MG TOTAL) BY MOUTH DAILY. TAKE 30-60 MIN BEFORE FIRST MEAL OF THE DAY 90 tablet 0  . predniSONE (DELTASONE) 10 MG tablet Take 1 tablet (10 mg total) by mouth daily. 21 tablet 0  . Respiratory Therapy Supplies (FLUTTER) DEVI Use as directed 1 each 0   No current facility-administered medications for this visit.     REVIEW OF SYSTEMS:   Constitutional: Denies fevers, chills or abnormal night sweats Eyes: Denies blurriness of vision, double vision or watery eyes Ears, nose, mouth, throat, and face: Denies mucositis or sore throat Respiratory: Denies cough, dyspnea or wheezes Cardiovascular: Denies palpitation, chest discomfort or lower extremity swelling Gastrointestinal:  Denies nausea, heartburn or change in bowel habits Skin: Denies abnormal skin rashes Lymphatics: Denies new lymphadenopathy or easy bruising Neurological:Denies numbness, tingling or new weaknesses Behavioral/Psych: Mood is stable, no new changes   All other systems were reviewed with the patient and are negative.  PHYSICAL EXAMINATION: ECOG PERFORMANCE STATUS: 1 - Symptomatic but completely ambulatory  Vitals:   06/25/17 1306  BP: 130/90  Pulse: 96  Resp: 18  Temp: 97.6 F (36.4 C)  SpO2: 100%   Filed Weights   06/25/17 1306  Weight: 208 lb 11.2 oz (94.7 kg)    GENERAL:alert, no distress and comfortable SKIN: skin color, texture, turgor are normal, no rashes or significant lesions EYES: normal, conjunctiva are pink and non-injected, sclera clear OROPHARYNX:no exudate, no erythema and lips, buccal mucosa, and tongue normal  NECK: supple, thyroid normal size, non-tender, without nodularity LYMPH:  no palpable lymphadenopathy in the cervical, axillary or inguinal LUNGS: clear to auscultation and percussion with normal breathing effort HEART: regular rate & rhythm and no murmurs and no lower extremity edema ABDOMEN:abdomen  soft, non-tender and normal bowel sounds Musculoskeletal:no cyanosis of digits and no clubbing  PSYCH: alert & oriented x 3 with fluent speech NEURO: no focal motor/sensory deficits  LABORATORY DATA:  I have reviewed the data as listed Lab Results  Component Value Date   WBC 6.2 09/16/2016   HGB 14.1 09/16/2016   HCT 41.2 09/16/2016   MCV 88.8 09/16/2016   PLT 236 09/16/2016   Lab Results  Component Value Date   NA 138 09/16/2016   K 3.9 09/16/2016   CL 102 09/16/2016   CO2 28 09/16/2016    RADIOGRAPHIC STUDIES: I have personally reviewed the radiological reports and agreed with the findings in the report.  ASSESSMENT AND PLAN:  DVT, lower extremity Chronic/recurrent blood clots I discussed with the patient risk factors for blood clots. First blood clots in  both lower extremities 1980: Patient was apparently not treated with anticoagulation.  This was right after she had vein stripping surgery  Recent acute DVT after knee surgery: Currently on Eliquis It appears that both these blood clotting incidents were after surgical episodes.  If she has no identifiable additional risk factors, then she needs to be treated with anticoagulation for 6 months.  Inherited risk factors include: 1. Factor V Leiden mutation 2. Prothrombin gene G20210A 3. Protein S deficiency  4. Protein C deficiency  5. Antithrombin deficiency  Acquired risk factors include: 1. Antiphospholipid antibody syndrome 2. Obesity 3. Sedentary behavior including postoperative state   Workup recommended: Bloodwork to evaluate for the 5 inherited factors mentioned above along with antiphospholipid antibodies. Return to clinic in one to 2 weeks to discuss the results of the tests    All questions were answered. The patient knows to call the clinic with any problems, questions or concerns.    Rulon Eisenmenger, MD 06/25/17

## 2017-06-25 NOTE — Telephone Encounter (Signed)
Gave patient avs report and appointments for December  °

## 2017-06-25 NOTE — Assessment & Plan Note (Addendum)
Chronic/recurrent blood clots I discussed with the patient risk factors for blood clots. First blood clots in both lower extremities 1980: Patient was apparently not treated with anticoagulation.  This was right after she had vein stripping surgery  Recent acute DVT after knee surgery: Currently on Eliquis It appears that both these blood clotting incidents were after surgical episodes.  If she has no identifiable additional risk factors, then she needs to be treated with anticoagulation for 6 months.  Inherited risk factors include: 1. Factor V Leiden mutation 2. Prothrombin gene G20210A 3. Protein S deficiency  4. Protein C deficiency  5. Antithrombin deficiency  Acquired risk factors include: 1. Antiphospholipid antibody syndrome 2. Obesity 3. Sedentary behavior including postoperative state   Workup recommended: Bloodwork to evaluate for the 5 inherited factors mentioned above along with antiphospholipid antibodies. Return to clinic in one to 2 weeks to discuss the results of the tests

## 2017-06-26 LAB — HOMOCYSTEINE: Homocysteine: 9.9 umol/L (ref 0.0–15.0)

## 2017-06-26 LAB — PROTEIN S, TOTAL: Protein S, Total: 127 % (ref 60–150)

## 2017-06-26 LAB — FACTOR 8 ASSAY: FACTOR VIII ACTIVITY: 222 % — AB (ref 57–163)

## 2017-06-26 LAB — PROTEIN C, TOTAL: Protein C Antigen: 102 % (ref 60–150)

## 2017-06-27 LAB — BETA-2-GLYCOPROTEIN I ABS, IGG/M/A
BETA 2 GLYCO 1 IGA: 9 GPI IgA units (ref 0–25)
Beta-2 Glyco 1 IgM: <9 GPI IgM units (ref 0–32)

## 2017-06-27 LAB — LUPUS ANTICOAGULANT PANEL
DRVVT MIX: 51.9 s — AB (ref 0.0–47.0)
DRVVT: 72.5 s — AB (ref 0.0–47.0)
PTT-LA: 35.2 s (ref 0.0–51.9)
dRVVT Confirm: 1.2 ratio (ref 0.8–1.2)

## 2017-06-27 LAB — CARDIOLIPIN ANTIBODIES, IGG, IGM, IGA
ANTICARDIOLIPIN IGM: 10 [MPL'U]/mL (ref 0–12)
Anticardiolipin Ab,IgA,Qn: 18 APL U/mL — ABNORMAL HIGH (ref 0–11)
Anticardiolipin Ab,IgG,Qn: <9 GPL U/mL (ref 0–14)

## 2017-06-28 LAB — FACTOR 5 LEIDEN

## 2017-07-02 ENCOUNTER — Telehealth: Payer: Self-pay | Admitting: Hematology and Oncology

## 2017-07-02 ENCOUNTER — Ambulatory Visit (HOSPITAL_BASED_OUTPATIENT_CLINIC_OR_DEPARTMENT_OTHER): Payer: BLUE CROSS/BLUE SHIELD | Admitting: Hematology and Oncology

## 2017-07-02 DIAGNOSIS — I82491 Acute embolism and thrombosis of other specified deep vein of right lower extremity: Secondary | ICD-10-CM

## 2017-07-02 DIAGNOSIS — I82409 Acute embolism and thrombosis of unspecified deep veins of unspecified lower extremity: Secondary | ICD-10-CM

## 2017-07-02 LAB — PROTHROMBIN GENE MUTATION

## 2017-07-02 MED ORDER — RIVAROXABAN 20 MG PO TABS: 20.0000 mg | ORAL_TABLET | Freq: Every day | ORAL | 4 refills | Status: DC

## 2017-07-02 NOTE — Progress Notes (Signed)
Patient Care Team: Schoenhoff, Altamese Cabal, MD as PCP - General (Internal Medicine)  DIAGNOSIS:  Encounter Diagnosis  Name Primary?  . Acute deep vein thrombosis (DVT) of other specified vein of right lower extremity (HCC)    CHIEF COMPLIANT: Follow-up of blood work for hypercoagulability  INTERVAL HISTORY: Anita Benton is a 61 year old with above-mentioned history of recurrent blood clot after knee surgery who underwent extensive blood work and is here today to discuss the results.  She is moving much better.  The pain in the knee has improved slightly.  REVIEW OF SYSTEMS:   Constitutional: Denies fevers, chills or abnormal weight loss Eyes: Denies blurriness of vision Ears, nose, mouth, throat, and face: Denies mucositis or sore throat Respiratory: Denies cough, dyspnea or wheezes Cardiovascular: Denies palpitation, chest discomfort Gastrointestinal:  Denies nausea, heartburn or change in bowel habits Skin: Denies abnormal skin rashes Lymphatics: Denies new lymphadenopathy or easy bruising Neurological:Denies numbness, tingling or new weaknesses Behavioral/Psych: Mood is stable, no new changes  Extremities: No lower extremity edema All other systems were reviewed with the patient and are negative.  I have reviewed the past medical history, past surgical history, social history and family history with the patient and they are unchanged from previous note.  ALLERGIES:  is allergic to codeine; levofloxacin; and rice.  MEDICATIONS:  Current Outpatient Medications  Medication Sig Dispense Refill  . albuterol (ACCUNEB) 1.25 MG/3ML nebulizer solution Take 1 ampule by nebulization every 6 (six) hours as needed for wheezing.    Marland Kitchen atorvastatin (LIPITOR) 10 MG tablet Take 1 tablet (10 mg total) by mouth daily. 90 tablet 1  . Azelastine-Fluticasone (DYMISTA) 137-50 MCG/ACT SUSP Place 1 spray into the nose 2 (two) times daily. 1 Bottle 11  . doxycycline (VIBRAMYCIN) 100 MG capsule  Take 1 capsule (100 mg total) by mouth 2 (two) times daily. 20 capsule 0  . escitalopram (LEXAPRO) 10 MG tablet Take 1 tablet (10 mg total) by mouth daily. 90 tablet 1  . famotidine (PEPCID) 20 MG tablet TAKE 1 TABLET BY MOUTH AT BEDTIME 90 tablet 0  . guaiFENesin-codeine 100-10 MG/5ML syrup Take 5 mLs by mouth every 6 (six) hours as needed for cough. 120 mL 0  . hydrochlorothiazide (HYDRODIURIL) 25 MG tablet Take 1 tablet (25 mg total) by mouth daily. 90 tablet 1  . montelukast (SINGULAIR) 10 MG tablet Take 1 tablet (10 mg total) by mouth at bedtime. 90 tablet 1  . MULTIPLE VITAMIN PO Take 1 tablet by mouth.    . pantoprazole (PROTONIX) 40 MG tablet TAKE 1 TABLET (40 MG TOTAL) BY MOUTH DAILY. TAKE 30-60 MIN BEFORE FIRST MEAL OF THE DAY 90 tablet 0  . predniSONE (DELTASONE) 10 MG tablet Take 1 tablet (10 mg total) by mouth daily. 21 tablet 0  . Respiratory Therapy Supplies (FLUTTER) DEVI Use as directed 1 each 0   No current facility-administered medications for this visit.     PHYSICAL EXAMINATION: ECOG PERFORMANCE STATUS: 1 - Symptomatic but completely ambulatory  Vitals:   07/02/17 1451  BP: 113/69  Pulse: 95  Resp: 18  Temp: 98.1 F (36.7 C)  SpO2: 97%   Filed Weights   07/02/17 1451  Weight: 209 lb 6.4 oz (95 kg)    GENERAL:alert, no distress and comfortable SKIN: skin color, texture, turgor are normal, no rashes or significant lesions EYES: normal, Conjunctiva are pink and non-injected, sclera clear OROPHARYNX:no exudate, no erythema and lips, buccal mucosa, and tongue normal  NECK: supple, thyroid normal size, non-tender,  without nodularity LYMPH:  no palpable lymphadenopathy in the cervical, axillary or inguinal LUNGS: clear to auscultation and percussion with normal breathing effort HEART: regular rate & rhythm and no murmurs and no lower extremity edema ABDOMEN:abdomen soft, non-tender and normal bowel sounds MUSCULOSKELETAL:no cyanosis of digits and no clubbing    NEURO: alert & oriented x 3 with fluent speech, no focal motor/sensory deficits EXTREMITIES: No lower extremity edema LABORATORY DATA:  I have reviewed the data as listed   Chemistry      Component Value Date/Time   NA 138 09/16/2016 1550   K 3.9 09/16/2016 1550   CL 102 09/16/2016 1550   CO2 28 09/16/2016 1550   BUN 13 09/16/2016 1550   CREATININE 0.82 09/16/2016 1550   CREATININE 0.70 10/13/2013 0914      Component Value Date/Time   CALCIUM 8.6 (L) 09/16/2016 1550   ALKPHOS 75 09/16/2016 1550   AST 45 (H) 09/16/2016 1550   ALT 26 09/16/2016 1550   BILITOT 0.4 09/16/2016 1550       Lab Results  Component Value Date   WBC 6.2 09/16/2016   HGB 14.1 09/16/2016   HCT 41.2 09/16/2016   MCV 88.8 09/16/2016   PLT 236 09/16/2016   NEUTROABS 2.9 09/16/2016    ASSESSMENT & PLAN:  DVT, lower extremity First blood clots in both lower extremities 1980: Patient was apparently not treated with anticoagulation.  This was right after she had vein stripping surgery  Recent acute DVT after knee surgery: Currently on Eliquis It appears that both these blood clotting incidents were after surgical episodes.  If she has no identifiable additional risk factors, then she needs to be treated with anticoagulation for 6 months.  Lab review: 1. Factor V Leiden mutation: No mutation 2. Prothrombin gene G20210A: Pending 3. Protein S deficiency: Normal 4. Protein C deficiency: Normal 5. Antithrombin deficiency: Normal 6.  Factor VIII level increased: 222% 7.  Lupus anticoagulant testing: Negative but did show evidence of specific factor inhibitors to common pathway like 10, 5, 2 or fibrinogen  Discussion: The only main risk factor identified was elevation of factor VIII along with sedentary behavior related to postoperative state.  There are no guidelines for elevation of factor VIII.  Recommendation: 6 months of anticoagulation.  We will repeat ultrasound of the leg and if it is negative  then she can stop anticoagulation in May 2019.   I spent 25 minutes talking to the patient of which more than half was spent in counseling and coordination of care.  No orders of the defined types were placed in this encounter.  The patient has a good understanding of the overall plan. she agrees with it. she will call with any problems that may develop before the next visit here.   Rulon Eisenmenger, MD 07/02/17

## 2017-07-02 NOTE — Assessment & Plan Note (Signed)
First blood clots in both lower extremities 1980: Patient was apparently not treated with anticoagulation.  This was right after she had vein stripping surgery  Recent acute DVT after knee surgery: Currently on Eliquis It appears that both these blood clotting incidents were after surgical episodes.  If she has no identifiable additional risk factors, then she needs to be treated with anticoagulation for 6 months.  Lab review: 1. Factor V Leiden mutation: No mutation 2. Prothrombin gene G20210A: Pending 3. Protein S deficiency: Normal 4. Protein C deficiency: Normal 5. Antithrombin deficiency: Normal 6.  Factor VIII level increased: 222% 7.  Lupus anticoagulant testing: Negative but did show evidence of specific factor inhibitors to common pathway like 10, 5, 2 or fibrinogen  Discussion: The only main risk factor identified was elevation of factor VIII along with sedentary behavior related to postoperative state.  There are no guidelines for elevation of factor VIII.  Recommendation: 6 months of anticoagulation.  If she develops another blood clot, then lifelong anticoagulation would be recommended.

## 2017-07-02 NOTE — Telephone Encounter (Signed)
Gave patient AVS and calendar of upcoming May 2019 appointments per 12/3 los.

## 2017-08-01 IMAGING — CR DG CHEST 2V
2 series · 2 of 2 positions shown · non-contrast
Comparison: 09/16/2016.

CLINICAL DATA: Intermittent mid chest pain. Pneumonia diagnosed in
August 2016.

EXAM:
CHEST  2 VIEW

[w chest pa]
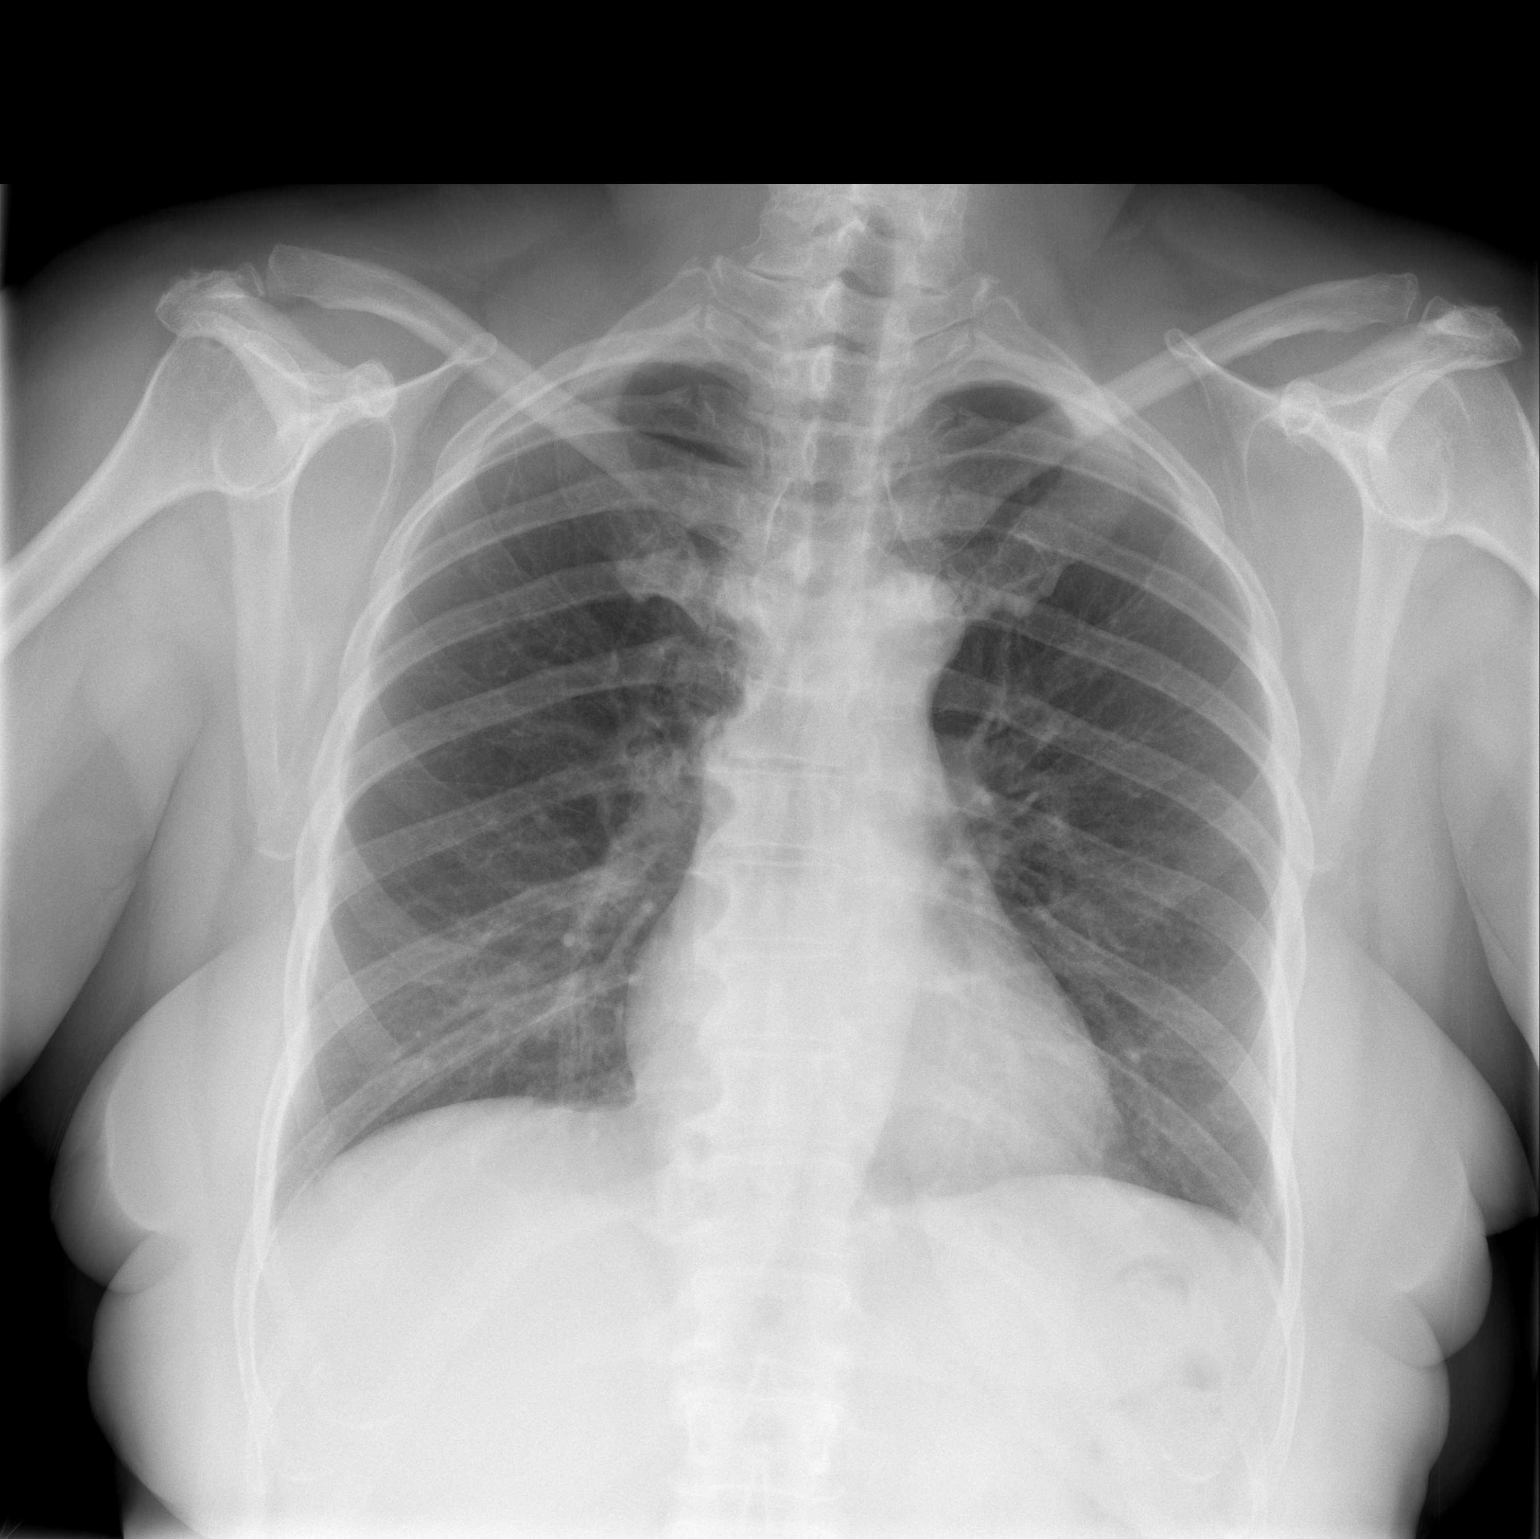

[w chest lat]
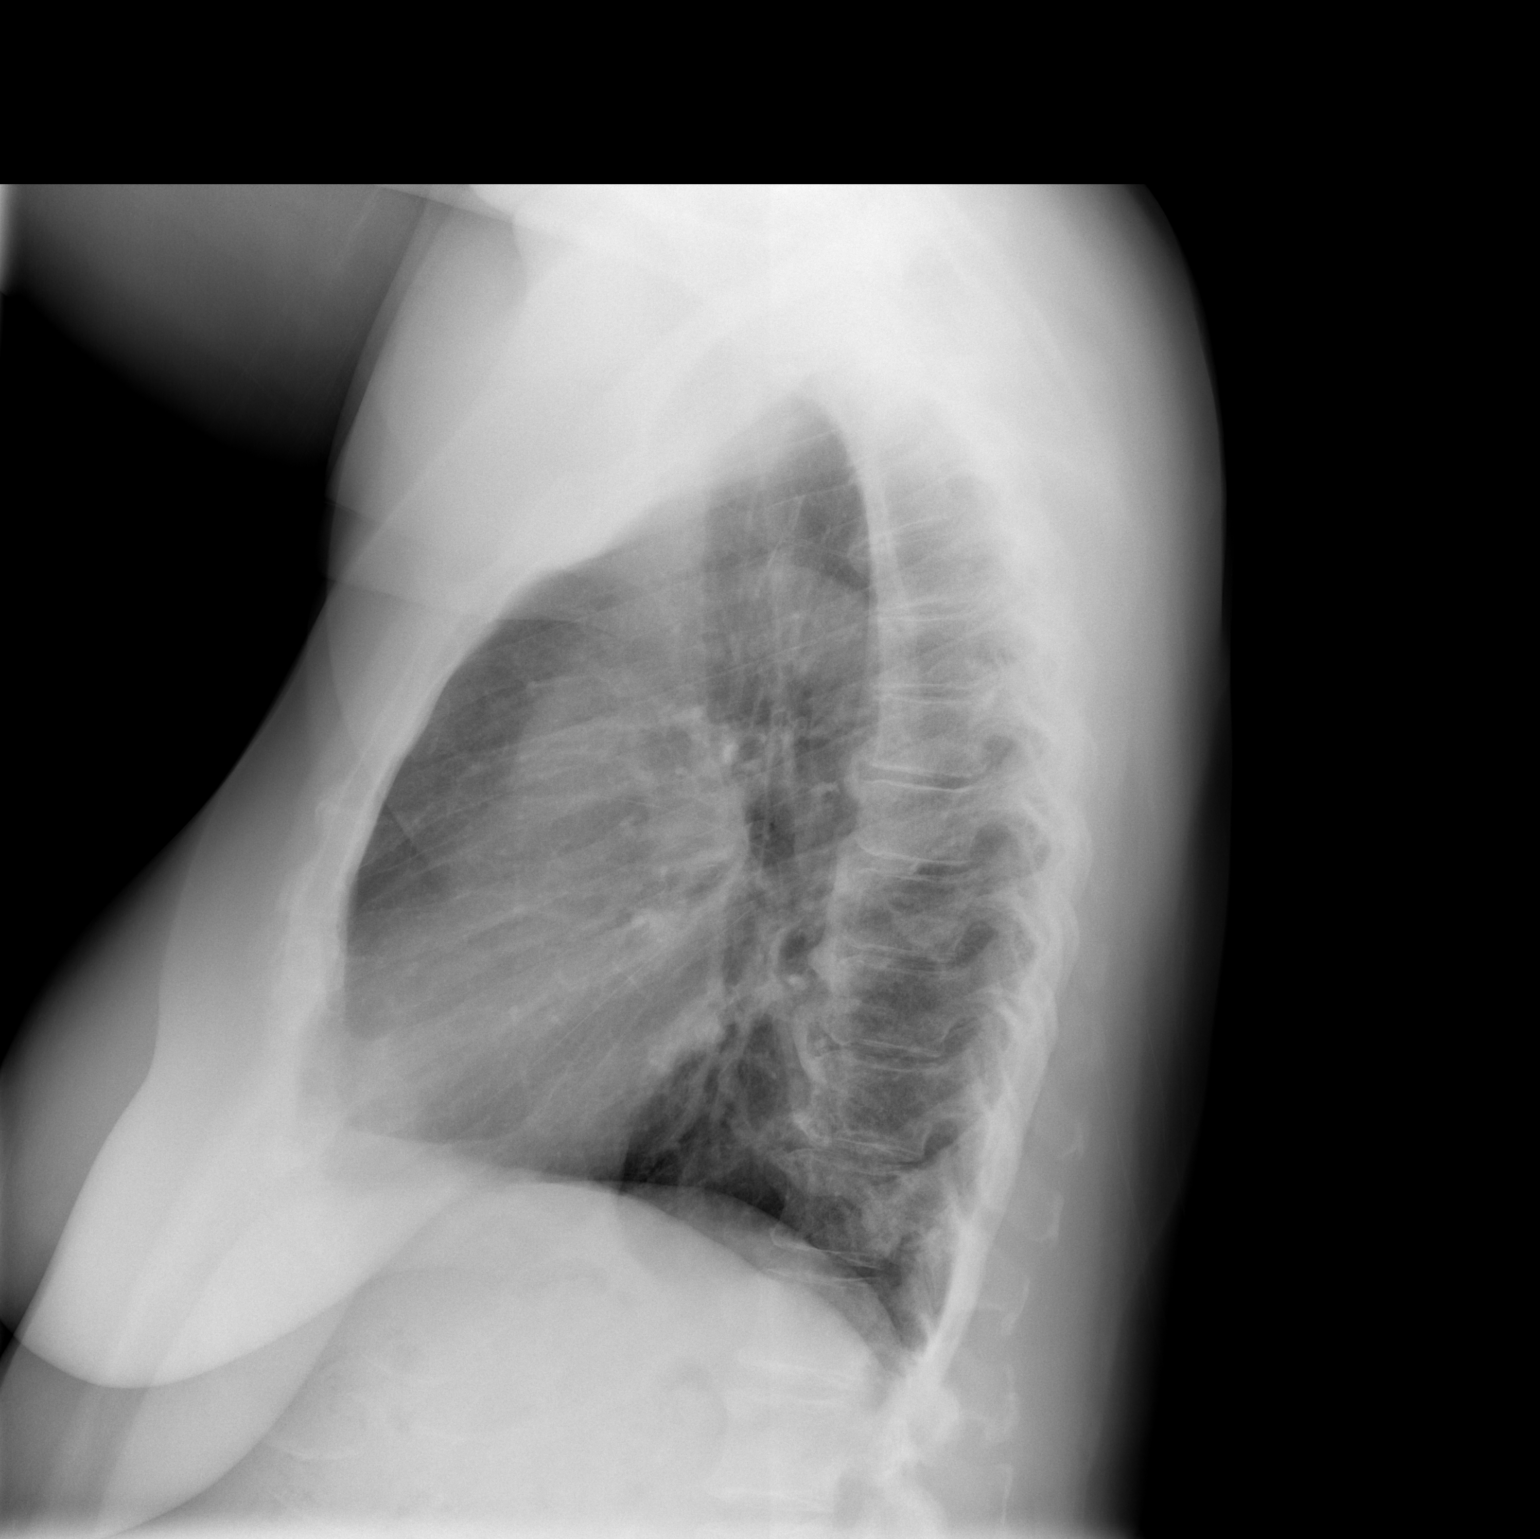

[2 of 2 positions shown; findings below may reference images not displayed]

FINDINGS: Normal sized heart. Clear lungs. Thoracic spine degenerative
changes.
IMPRESSION: Resolved pneumonia.  No acute abnormality.

## 2017-08-04 ENCOUNTER — Observation Stay (HOSPITAL_BASED_OUTPATIENT_CLINIC_OR_DEPARTMENT_OTHER)
Admission: EM | Admit: 2017-08-04 | Discharge: 2017-08-07 | Disposition: A | Discharge status: Home / Self Care | Payer: BLUE CROSS/BLUE SHIELD | Attending: Internal Medicine | Admitting: Internal Medicine

## 2017-08-04 ENCOUNTER — Emergency Department (HOSPITAL_BASED_OUTPATIENT_CLINIC_OR_DEPARTMENT_OTHER): Payer: BLUE CROSS/BLUE SHIELD

## 2017-08-04 ENCOUNTER — Encounter (HOSPITAL_BASED_OUTPATIENT_CLINIC_OR_DEPARTMENT_OTHER): Payer: Self-pay | Admitting: *Deleted

## 2017-08-04 ENCOUNTER — Other Ambulatory Visit: Payer: Self-pay

## 2017-08-04 DIAGNOSIS — J45909 Unspecified asthma, uncomplicated: Secondary | ICD-10-CM | POA: Diagnosis not present

## 2017-08-04 DIAGNOSIS — I82409 Acute embolism and thrombosis of unspecified deep veins of unspecified lower extremity: Secondary | ICD-10-CM | POA: Diagnosis present

## 2017-08-04 DIAGNOSIS — R2990 Unspecified symptoms and signs involving the nervous system: Secondary | ICD-10-CM

## 2017-08-04 DIAGNOSIS — Z91018 Allergy to other foods: Secondary | ICD-10-CM | POA: Diagnosis not present

## 2017-08-04 DIAGNOSIS — E669 Obesity, unspecified: Secondary | ICD-10-CM | POA: Diagnosis not present

## 2017-08-04 DIAGNOSIS — E559 Vitamin D deficiency, unspecified: Secondary | ICD-10-CM | POA: Diagnosis not present

## 2017-08-04 DIAGNOSIS — E785 Hyperlipidemia, unspecified: Secondary | ICD-10-CM | POA: Diagnosis not present

## 2017-08-04 DIAGNOSIS — Z881 Allergy status to other antibiotic agents status: Secondary | ICD-10-CM | POA: Diagnosis not present

## 2017-08-04 DIAGNOSIS — K219 Gastro-esophageal reflux disease without esophagitis: Secondary | ICD-10-CM | POA: Diagnosis not present

## 2017-08-04 DIAGNOSIS — Z885 Allergy status to narcotic agent status: Secondary | ICD-10-CM | POA: Diagnosis not present

## 2017-08-04 DIAGNOSIS — G43009 Migraine without aura, not intractable, without status migrainosus: Secondary | ICD-10-CM

## 2017-08-04 DIAGNOSIS — G459 Transient cerebral ischemic attack, unspecified: Secondary | ICD-10-CM | POA: Insufficient documentation

## 2017-08-04 DIAGNOSIS — I10 Essential (primary) hypertension: Secondary | ICD-10-CM | POA: Diagnosis present

## 2017-08-04 DIAGNOSIS — I824Y9 Acute embolism and thrombosis of unspecified deep veins of unspecified proximal lower extremity: Secondary | ICD-10-CM | POA: Insufficient documentation

## 2017-08-04 DIAGNOSIS — G43909 Migraine, unspecified, not intractable, without status migrainosus: Secondary | ICD-10-CM | POA: Insufficient documentation

## 2017-08-04 DIAGNOSIS — Z6835 Body mass index (BMI) 35.0-35.9, adult: Secondary | ICD-10-CM | POA: Diagnosis not present

## 2017-08-04 DIAGNOSIS — Z7901 Long term (current) use of anticoagulants: Secondary | ICD-10-CM | POA: Insufficient documentation

## 2017-08-04 DIAGNOSIS — Z79899 Other long term (current) drug therapy: Secondary | ICD-10-CM | POA: Insufficient documentation

## 2017-08-04 NOTE — ED Triage Notes (Addendum)
Pt c/o numbness upon arrival. In triage Pt reports she was "itching all over" last night. Took benadryl. Today woke with left side of face "numb" Pt reports lips are itching and left side of face is itching as well. CAO x 4. EKG done and given to Dr. Ralene Bathe for review. Dr. Ralene Bathe updated on pt's condition at this time

## 2017-08-04 NOTE — ED Notes (Addendum)
Pt endorses that she started having itching last night, but when she woke up at 18:30 this evening she noticed numbness on the L side of her face. Pt's daughter who is in room states when she talked to her on the phone this evening she noted that the pt was "talking slowly."

## 2017-08-04 NOTE — ED Notes (Signed)
Patient transported to CT 

## 2017-08-04 NOTE — ED Notes (Signed)
ED Provider at bedside. 

## 2017-08-04 NOTE — ED Notes (Signed)
Pt also reports she recently had shingles along her hairline (left side of face). Facial symmetry present. Speech Clear

## 2017-08-04 NOTE — ED Notes (Addendum)
Paged Neurology Cheral Marker) for consult.

## 2017-08-05 ENCOUNTER — Other Ambulatory Visit: Payer: Self-pay

## 2017-08-05 ENCOUNTER — Inpatient Hospital Stay (HOSPITAL_COMMUNITY): Payer: BLUE CROSS/BLUE SHIELD

## 2017-08-05 DIAGNOSIS — G459 Transient cerebral ischemic attack, unspecified: Secondary | ICD-10-CM

## 2017-08-05 DIAGNOSIS — E785 Hyperlipidemia, unspecified: Secondary | ICD-10-CM | POA: Diagnosis not present

## 2017-08-05 DIAGNOSIS — R2990 Unspecified symptoms and signs involving the nervous system: Secondary | ICD-10-CM | POA: Diagnosis not present

## 2017-08-05 DIAGNOSIS — I10 Essential (primary) hypertension: Secondary | ICD-10-CM

## 2017-08-05 LAB — COMPREHENSIVE METABOLIC PANEL
ALK PHOS: 93 U/L (ref 38–126)
ALT: 21 U/L (ref 14–54)
ANION GAP: 8 (ref 5–15)
AST: 22 U/L (ref 15–41)
Albumin: 3.5 g/dL (ref 3.5–5.0)
BILIRUBIN TOTAL: 0.5 mg/dL (ref 0.3–1.2)
BUN: 17 mg/dL (ref 6–20)
CALCIUM: 9.2 mg/dL (ref 8.9–10.3)
CO2: 26 mmol/L (ref 22–32)
Chloride: 106 mmol/L (ref 101–111)
Creatinine, Ser: 1.01 mg/dL — ABNORMAL HIGH (ref 0.44–1.00)
GFR calc non Af Amer: 60 mL/min — ABNORMAL LOW (ref >60)
Glucose, Bld: 98 mg/dL (ref 65–99)
POTASSIUM: 3.8 mmol/L (ref 3.5–5.1)
SODIUM: 140 mmol/L (ref 135–145)
TOTAL PROTEIN: 7.2 g/dL (ref 6.5–8.1)

## 2017-08-05 LAB — TROPONIN I: Troponin I: <0.03 ng/mL (ref <0.03)

## 2017-08-05 LAB — DIFFERENTIAL
Basophils Absolute: 0 10*3/uL (ref 0.0–0.1)
Basophils Relative: 0 %
EOS ABS: 0.1 10*3/uL (ref 0.0–0.7)
EOS PCT: 1 %
LYMPHS PCT: 31 %
Lymphs Abs: 2.2 10*3/uL (ref 0.7–4.0)
MONO ABS: 0.4 10*3/uL (ref 0.1–1.0)
Monocytes Relative: 6 %
Neutro Abs: 4.3 10*3/uL (ref 1.7–7.7)
Neutrophils Relative %: 62 %

## 2017-08-05 LAB — URINALYSIS, MICROSCOPIC (REFLEX)

## 2017-08-05 LAB — URINALYSIS, ROUTINE W REFLEX MICROSCOPIC
Bilirubin Urine: NEGATIVE
GLUCOSE, UA: NEGATIVE mg/dL
Ketones, ur: 15 mg/dL — AB
Nitrite: NEGATIVE
Protein, ur: NEGATIVE mg/dL
Specific Gravity, Urine: >1.03 — ABNORMAL HIGH (ref 1.005–1.030)
pH: 5.5 (ref 5.0–8.0)

## 2017-08-05 LAB — ETHANOL

## 2017-08-05 LAB — CBC
HCT: 39 % (ref 36.0–46.0)
Hemoglobin: 13.1 g/dL (ref 12.0–15.0)
MCH: 29.7 pg (ref 26.0–34.0)
MCHC: 33.6 g/dL (ref 30.0–36.0)
MCV: 88.4 fL (ref 78.0–100.0)
PLATELETS: 275 10*3/uL (ref 150–400)
RBC: 4.41 MIL/uL (ref 3.87–5.11)
RDW: 13.4 % (ref 11.5–15.5)
WBC: 6.9 10*3/uL (ref 4.0–10.5)

## 2017-08-05 LAB — APTT: aPTT: 38 seconds — ABNORMAL HIGH (ref 24–36)

## 2017-08-05 LAB — RAPID URINE DRUG SCREEN, HOSP PERFORMED
Amphetamines: NOT DETECTED
Barbiturates: NOT DETECTED
Benzodiazepines: NOT DETECTED
COCAINE: NOT DETECTED
OPIATES: NOT DETECTED
Tetrahydrocannabinol: NOT DETECTED

## 2017-08-05 LAB — PROTIME-INR
INR: 1.12
PROTHROMBIN TIME: 14.3 s (ref 11.4–15.2)

## 2017-08-05 MED ORDER — SENNOSIDES-DOCUSATE SODIUM 8.6-50 MG PO TABS: 1.0000 | ORAL_TABLET | Freq: Every evening | ORAL | Status: DC | PRN

## 2017-08-05 MED ORDER — ACETAMINOPHEN 160 MG/5ML PO SOLN
650.0000 mg | ORAL | Status: DC | PRN
Start: 2017-08-05 — End: 2017-08-07

## 2017-08-05 MED ORDER — ACETAMINOPHEN 650 MG RE SUPP: 650.0000 mg | RECTAL | Status: DC | PRN

## 2017-08-05 MED ORDER — ATORVASTATIN CALCIUM 10 MG PO TABS
10.0000 mg | ORAL_TABLET | Freq: Every day | ORAL | Status: DC
  Administered 2017-08-05: 10 mg via ORAL
  Filled 2017-08-05: qty 1

## 2017-08-05 MED ORDER — STROKE: EARLY STAGES OF RECOVERY BOOK: Freq: Once | Status: DC

## 2017-08-05 MED ORDER — PANTOPRAZOLE SODIUM 40 MG PO TBEC
40.0000 mg | DELAYED_RELEASE_TABLET | Freq: Every day | ORAL | Status: DC
  Administered 2017-08-05 – 2017-08-07 (×3): 40 mg via ORAL
  Filled 2017-08-05 (×3): qty 1

## 2017-08-05 MED ORDER — APIXABAN 5 MG PO TABS
5.0000 mg | ORAL_TABLET | Freq: Two times a day (BID) | ORAL | Status: DC
  Administered 2017-08-05 – 2017-08-07 (×4): 5 mg via ORAL
  Filled 2017-08-05 (×5): qty 1

## 2017-08-05 MED ORDER — ESCITALOPRAM OXALATE 10 MG PO TABS
10.0000 mg | ORAL_TABLET | Freq: Every day | ORAL | Status: DC
  Administered 2017-08-05 – 2017-08-07 (×3): 10 mg via ORAL
  Filled 2017-08-05 (×3): qty 1

## 2017-08-05 MED ORDER — ACETAMINOPHEN 325 MG PO TABS
650.0000 mg | ORAL_TABLET | ORAL | Status: DC | PRN
Start: 2017-08-05 — End: 2017-08-07
  Administered 2017-08-05 – 2017-08-06 (×3): 650 mg via ORAL
  Filled 2017-08-05 (×3): qty 2

## 2017-08-05 MED ORDER — DIPHENHYDRAMINE HCL 25 MG PO CAPS
25.0000 mg | ORAL_CAPSULE | Freq: Once | ORAL | Status: AC
Start: 2017-08-05 — End: 2017-08-05
  Administered 2017-08-05: 25 mg via ORAL
  Filled 2017-08-05: qty 1

## 2017-08-05 MED ORDER — ALBUTEROL SULFATE (2.5 MG/3ML) 0.083% IN NEBU: 2.5000 mg | INHALATION_SOLUTION | Freq: Four times a day (QID) | RESPIRATORY_TRACT | Status: DC | PRN

## 2017-08-05 MED ORDER — ACETAMINOPHEN 325 MG PO TABS
650.0000 mg | ORAL_TABLET | Freq: Once | ORAL | Status: AC
  Administered 2017-08-05: 650 mg via ORAL
  Filled 2017-08-05: qty 2

## 2017-08-05 MED ORDER — DIAZEPAM 5 MG PO TABS
5.0000 mg | ORAL_TABLET | Freq: Once | ORAL | Status: AC
  Administered 2017-08-05: 5 mg via ORAL
  Filled 2017-08-05: qty 1

## 2017-08-05 MED ORDER — MONTELUKAST SODIUM 10 MG PO TABS
10.0000 mg | ORAL_TABLET | Freq: Every day | ORAL | Status: DC
  Administered 2017-08-05 – 2017-08-06 (×2): 10 mg via ORAL
  Filled 2017-08-05 (×2): qty 1

## 2017-08-05 MED ORDER — DIPHENHYDRAMINE HCL 25 MG PO CAPS
25.0000 mg | ORAL_CAPSULE | Freq: Once | ORAL | Status: AC
  Administered 2017-08-05: 25 mg via ORAL
  Filled 2017-08-05: qty 1

## 2017-08-05 MED ORDER — FAMOTIDINE 20 MG PO TABS: 20.0000 mg | ORAL_TABLET | Freq: Every day | ORAL | Status: DC | PRN

## 2017-08-05 MED ORDER — DIPHENHYDRAMINE HCL 25 MG PO CAPS
25.0000 mg | ORAL_CAPSULE | Freq: Four times a day (QID) | ORAL | Status: DC | PRN
  Administered 2017-08-05 – 2017-08-06 (×3): 25 mg via ORAL
  Filled 2017-08-05 (×3): qty 1

## 2017-08-05 NOTE — ED Provider Notes (Signed)
[facility] [facility] Provider Note   CSN: [id number] Arrival date & time: 08/04/17  2114   An emergency department physician performed an initial assessment on this suspected stroke patient at 2311.  History   Chief Complaint Chief Complaint  Patient presents with  . Pruritis    HPI Anita Benton is a 62 y.o. female.  HPI  Patient is a 62 year old female presenting with numbness and weakness.  Past medical history significant for stroke, DVT, migraines. Patient reports that around 6 PM when she woke up from her nap she developed some left sided facial numbness.  She also reports left upper arm weakness that has developed since we been in the waiting room.  Patient is on Eliquis for previous blood clot.  Patient's been compliant with medication and took her medication this morning.  Patient reports 2 other strokes, one in 1970, and 1 several years ago.  Patient also reports recent treatment for shingles in her hairline, now resolved.  Past Medical History:  Diagnosis Date  . Allergy   . Arthritis    "ankles; right hip"  . GERD (gastroesophageal reflux disease)   . Hyperlipidemia   . Itching   . Keloid    "q where; I have keloid skin"  . Menopause   . Migraines 04/01/2012  . Right leg DVT ([place]) 1980's  . Shingles 04/01/2012   "have them q year; usually in Nov; for the last 20 years"  . Shortness of breath 04/01/2012   "when my asthma acts up"  . Stroke [facility]) 1977; 04/01/2012   denies residual; "from BCP RX"; little weak right arm; right face little numb"  . Vitamin D deficiency     Patient Active Problem List   Diagnosis Date Noted  . Cough variant asthma with component of UACS 02/04/2016  . Edema 04/29/2013  . Foot swelling 10/21/2012  . Low back pain 08/09/2012  . URI, acute 08/05/2012  . Cough 08/05/2012  . Left hip pain 08/05/2012  . Zoster 07/02/2012  . Right hip pain 05/23/2012  . Herniated disc, cervical 04/09/2012  . Headache,  hemiplegic migraine 04/04/2012  . Gait abnormality 04/03/2012  . HTN (hypertension) 04/01/2012  . Finger pain, right 04/01/2012  . Finger, open wounds, complicated 23/76/2831  . Varicose veins 06/27/2011  . Urinary tract infection, site not specified 06/27/2011  . Nevus 06/27/2011  . DVT, lower extremity ([place]) 06/13/2011  . Arthritis   . Asthma in adult   . Hyperlipidemia   . Shingles   . Vitamin D deficiency   . Allergy   . Keloid   . Menopause     Past Surgical History:  Procedure Laterality Date  . ABDOMINAL HYSTERECTOMY  1980's  . [place]  . FINGER SURGERY  10/09/14  . TOTAL HIP ARTHROPLASTY Right 09-21-15  . VARICOSE VEIN SURGERY  1970's   bilaterally    OB History    Gravida Para Term Preterm AB Living   4 3     1      SAB TAB Ectopic Multiple Live Births     1             Home Medications    Prior to Admission medications   Medication Sig Start Date End Date Taking? Authorizing Provider  albuterol (ACCUNEB) 1.25 MG/3ML nebulizer solution Take 1 ampule by nebulization every 6 (six) hours as needed for wheezing.    [provider]  atorvastatin (LIPITOR) 10 MG tablet Take 1 tablet (  10 mg total) by mouth daily. 02/09/15   Ann Held, DO  Azelastine-Fluticasone (DYMISTA) 137-50 MCG/ACT SUSP Place 1 spray into the nose 2 (two) times daily. 06/14/16   Tanda Rockers, MD  doxycycline (VIBRAMYCIN) 100 MG capsule Take 1 capsule (100 mg total) by mouth 2 (two) times daily. 09/16/16   Davontae Prusinski Lyn, MD  escitalopram (LEXAPRO) 10 MG tablet Take 1 tablet (10 mg total) by mouth daily. 02/09/15   Roma Schanz R, DO  famotidine (PEPCID) 20 MG tablet TAKE 1 TABLET BY MOUTH AT BEDTIME 10/09/16   Tanda Rockers, MD  guaiFENesin-codeine 100-10 MG/5ML syrup Take 5 mLs by mouth every 6 (six) hours as needed for cough. 09/16/16   Lamond Glantz Lyn, MD  hydrochlorothiazide (HYDRODIURIL) 25 MG tablet Take 1 tablet (25 mg  total) by mouth daily. 02/09/15   Roma Schanz R, DO  montelukast (SINGULAIR) 10 MG tablet Take 1 tablet (10 mg total) by mouth at bedtime. 02/09/15   Roma Schanz R, DO  MULTIPLE VITAMIN PO Take 1 tablet by mouth.    [provider]  pantoprazole (PROTONIX) 40 MG tablet TAKE 1 TABLET (40 MG TOTAL) BY MOUTH DAILY. TAKE 30-60 MIN BEFORE FIRST MEAL OF THE DAY 10/10/16   Tanda Rockers, MD  predniSONE (DELTASONE) 10 MG tablet Take 1 tablet (10 mg total) by mouth daily. 09/16/16   Makena Mcgrady, Fredia Sorrow, MD  Respiratory Therapy Supplies (FLUTTER) DEVI Use as directed 02/24/16   Tanda Rockers, MD  rivaroxaban (XARELTO) 20 MG TABS tablet Take 1 tablet (20 mg total) by mouth daily with supper. 07/02/17   Nicholas Lose, MD    Family History Family History  Problem Relation Age of Onset  . Lung cancer Mother        smoked  . Hyperlipidemia Mother   . Hypertension Mother   . Bone cancer Father   . Breast cancer Cousin   . Sudden death Neg Hx   . Diabetes Neg Hx   . Heart attack Neg Hx     Social History Social History   Tobacco Use  . Smoking status: Never Smoker  . Smokeless tobacco: Never Used  Substance Use Topics  . Alcohol use: No  . Drug use: No     Allergies   Codeine; Levofloxacin; and Rice   Review of Systems Review of Systems  Constitutional: Negative for activity change.  Respiratory: Negative for shortness of breath.   Cardiovascular: Negative for chest pain.  Gastrointestinal: Negative for abdominal pain.  Neurological: Positive for weakness and numbness.  All other systems reviewed and are negative.    Physical Exam Updated Vital Signs BP (!) 151/85   Pulse 67   Temp 97.8 F (36.6 C) (Oral)   Resp 20   SpO2 100%   Physical Exam  Constitutional: She is oriented to person, place, and time. She appears well-developed and well-nourished.  HENT:  Head: Normocephalic and atraumatic.  Eyes: Right eye exhibits no discharge.    Cardiovascular: Normal rate, regular rhythm and normal heart sounds.  No murmur heard. Pulmonary/Chest: Effort normal and breath sounds normal. She has no wheezes. She has no rales.  Abdominal: Soft. She exhibits no distension. There is no tenderness.  Neurological: She is oriented to person, place, and time.  Patient has different sensation to left and right side of face.  No obvious facial droop.  Patient has mild left hand weakness when squeezing.  However negative pronator drift.  Finger  to nose intact.  Skin: Skin is warm and dry. She is not diaphoretic.  No evidence of shingles.  Psychiatric: She has a normal mood and affect.  Nursing note and vitals reviewed.    ED Treatments / Results  Labs (all labs ordered are listed, but only abnormal results are displayed) Labs Reviewed  APTT - Abnormal; Notable for the following components:      Result Value   aPTT 38 (*)    All other components within normal limits  COMPREHENSIVE METABOLIC PANEL - Abnormal; Notable for the following components:   Creatinine, Ser 1.01 (*)    GFR calc non Af Amer 60 (*)    All other components within normal limits  URINALYSIS, ROUTINE W REFLEX MICROSCOPIC - Abnormal; Notable for the following components:   APPearance CLOUDY (*)    Specific Gravity, Urine >1.030 (*)    Hgb urine dipstick LARGE (*)    Ketones, ur 15 (*)    Leukocytes, UA SMALL (*)    All other components within normal limits  URINALYSIS, MICROSCOPIC (REFLEX) - Abnormal; Notable for the following components:   Bacteria, UA MANY (*)    Squamous Epithelial / LPF 0-5 (*)    All other components within normal limits  ETHANOL  PROTIME-INR  CBC  DIFFERENTIAL  TROPONIN I  RAPID URINE DRUG SCREEN, HOSP PERFORMED    EKG  EKG Interpretation None       Radiology Ct Head Wo Contrast  Result Date: 08/04/2017 CLINICAL DATA:  Focal neuro deficit, < 6 hrs, stroke suspected. Numbness. EXAM: CT HEAD WITHOUT CONTRAST TECHNIQUE: Contiguous  axial images were obtained from the base of the skull through the vertex without intravenous contrast. COMPARISON:  Most recent head CT 06/08/2017. FINDINGS: Brain: Remote lacunar infarct versus prominent perivascular space in the right basal ganglia. No intracranial hemorrhage, mass effect, or midline shift. No hydrocephalus. The basilar cisterns are patent. No evidence of territorial infarct or acute ischemia. No extra-axial or intracranial fluid collection. Vascular: No hyperdense vessel or unexpected calcification. Skull: No fracture or focal lesion. Sinuses/Orbits: Paranasal sinuses and mastoid air cells are clear. The visualized orbits are unremarkable. Other: Small right frontal scalp lipoma is unchanged. IMPRESSION: No acute intracranial abnormality. Electronically Signed   By: Jeb Levering M.D.   On: 08/04/2017 23:50    Procedures Procedures (including critical care time)  Medications Ordered in ED Medications  diphenhydrAMINE (BENADRYL) capsule 25 mg (25 mg Oral Given 08/05/17 0026)     Initial Impression / Assessment and Plan / ED Course  I have reviewed the triage vital signs and the nursing notes.  Pertinent labs & imaging results that were available during my care of the patient were reviewed by me and considered in my medical decision making (see chart for details).     Patient is a 62 year old female presenting with numbness and weakness.  Past medical history significant for stroke, DVT, migraines. Patient reports that around 6 PM when she woke up from her nap she developed some left sided facial numbness.  She also reports left upper arm weakness that has developed since we been in the waiting room.  Patient is on Eliquis for previous blood clot.  Patient's been compliant with medication and took her medication this morning.  Patient reports 2 other strokes, one in 1970, and 1 several years ago.  Patient also reports recent treatment for shingles in her hairline, now  resolved.  1:43 AM Since patient is on Eliquis, she would not be a  TPA candidate.  She is also outside of the TPA window.  Patient does not meet criteria for LVO.   Discussed with Dr. Earnestine Leys on for neurology.  Will admit to [facility] for stroke work up, MRI.  Final Clinical Impressions(s) / ED Diagnoses   Final diagnoses:  None    ED Discharge Orders    None       Macarthur Critchley, MD 08/05/17 [phone]

## 2017-08-05 NOTE — ED Notes (Signed)
Pt c/o itching all over. Dr. Thomasene Lot aware and orders received. No other complaints at this time other than itching.

## 2017-08-05 NOTE — ED Notes (Signed)
Pt states her itching and numbness have lessened. Mild right facial droop noted. Left grip weaker than right. Otherwise wnl.

## 2017-08-05 NOTE — Progress Notes (Signed)
MD made aware of patient arrival through admissions

## 2017-08-05 NOTE — H&P (Signed)
History and Physical:    Anita Benton   KZS:[id number] DOB: 1956/06/13 DOA: 08/04/2017  Referring MD/provider: Dr Maudie Mercury PCP: Lanice Shirts, MD   Patient coming from: Home  Chief Complaint: left hand weakness   History of Present Illness:   Anita Benton is an 62 y.o. female with past medical history significant for supra vascular disease status post CVA 2, DVT on Eliquis, hyperlipidemia who was in her usual state of good health until 2 days prior to admission when she noted onset of numbness of her left hand and finger which was subsequently followed by left facial numbness and difficulty formulating words. She admits to sligh headache, denies head trauma.   Patient states that she feels much better at present. Notes that her facial numbness is still present but less and that her hand numbness is almost entirely resolved. Denies blurred vision photophobia or headache.  ED Course:  Head CT was negative. Patient discussed with Neurology and was not considered to be a TPA candidate, patient is now transferred here to complete workup.   ROS:   ROS   Review of Systems: General: No fever, chills, weight changes Skin: No rashes, lesions, wounds Eyes: no discharge, redness, pain HENT: no ear pain, hearing loss, drainage, tinnitus Endocrine: no heat/cold intolerance, no polyuria Respiratory: Positive cough,, shortness of breath, hemoptysis Cardiovascular: No palpitations, chest pain GI: No nausea, vomiting, diarrhea, constipation GU: No dysuria, increased frequency Musculoskeletal: No back pain, joint pain Mood/affect: No anxiety/depression    Past Medical History:   Past Medical History:  Diagnosis Date  . Allergy   . Arthritis    "ankles; right hip"  . GERD (gastroesophageal reflux disease)   . Hyperlipidemia   . Itching   . Keloid    "q where; I have keloid skin"  . Menopause   . Migraines 04/01/2012  . Right leg DVT ([place]) 1980's  . Shingles 04/01/2012    "have them q year; usually in Nov; for the last 20 years"  . Shortness of breath 04/01/2012   "when my asthma acts up"  . Stroke [facility]) 1977; 04/01/2012   denies residual; "from BCP RX"; little weak right arm; right face little numb"  . Vitamin D deficiency     Past Surgical History:   Past Surgical History:  Procedure Laterality Date  . ABDOMINAL HYSTERECTOMY  1980's  . [place]  . FINGER SURGERY  10/09/14  . TOTAL HIP ARTHROPLASTY Right 09-21-15  . VARICOSE VEIN SURGERY  1970's   bilaterally    Social History:   Social History   Socioeconomic History  . Marital status: Divorced    Spouse name: Not on file  . Number of children: Not on file  . Years of education: Not on file  . Highest education level: Not on file  Social Needs  . Financial resource strain: Not on file  . Food insecurity - worry: Not on file  . Food insecurity - inability: Not on file  . Transportation needs - medical: Not on file  . Transportation needs - non-medical: Not on file  Occupational History  . Not on file  Tobacco Use  . Smoking status: Never Smoker  . Smokeless tobacco: Never Used  Substance and Sexual Activity  . Alcohol use: No  . Drug use: No  . Sexual activity: Not on file  Other Topics Concern  . Not on file  Social History Narrative  . Not on file    Allergies  Codeine; Levofloxacin; and Rice  Family history:   Family History  Problem Relation Age of Onset  . Lung cancer Mother        smoked  . Hyperlipidemia Mother   . Hypertension Mother   . Bone cancer Father   . Breast cancer Cousin   . Sudden death Neg Hx   . Diabetes Neg Hx   . Heart attack Neg Hx     Current Medications:   Prior to Admission medications   Medication Sig Start Date End Date Taking? Authorizing Provider  atorvastatin (LIPITOR) 10 MG tablet Take 1 tablet (10 mg total) by mouth daily. 02/09/15  Yes Ann Held, DO  Cholecalciferol (VITAMIN D-1000 MAX ST)  1000 units tablet Take 1,000 Units by mouth daily.   Yes [provider]  cyclobenzaprine (FLEXERIL) 5 MG tablet Take 5 mg by mouth as needed (Knee Pain).  06/11/17  Yes [provider]  ELIQUIS 5 MG TABS tablet Take 5 mg by mouth 2 (two) times daily. 07/10/17  Yes [provider]  escitalopram (LEXAPRO) 10 MG tablet Take 1 tablet (10 mg total) by mouth daily. 02/09/15  Yes Roma Schanz R, DO  montelukast (SINGULAIR) 10 MG tablet Take 1 tablet (10 mg total) by mouth at bedtime. 02/09/15  Yes Roma Schanz R, DO  MULTIPLE VITAMIN PO Take 1 tablet by mouth.   Yes [provider]  pantoprazole (PROTONIX) 40 MG tablet TAKE 1 TABLET (40 MG TOTAL) BY MOUTH DAILY. TAKE 30-60 MIN BEFORE FIRST MEAL OF THE DAY 10/10/16  Yes Tanda Rockers, MD  albuterol (ACCUNEB) 1.25 MG/3ML nebulizer solution Take 1 ampule by nebulization every 6 (six) hours as needed for wheezing.    [provider]  Azelastine-Fluticasone (DYMISTA) 137-50 MCG/ACT SUSP Place 1 spray into the nose 2 (two) times daily. Patient not taking: Reported on 08/05/2017 06/14/16   Tanda Rockers, MD  doxycycline (VIBRAMYCIN) 100 MG capsule Take 1 capsule (100 mg total) by mouth 2 (two) times daily. Patient not taking: Reported on 08/05/2017 09/16/16   Mackuen, Courteney Lyn, MD  famotidine (PEPCID) 20 MG tablet TAKE 1 TABLET BY MOUTH AT BEDTIME Patient taking differently: TAKE 1 TABLET BY MOUTH AS NEEDED FOR GERD 10/09/16   Tanda Rockers, MD  guaiFENesin-codeine 100-10 MG/5ML syrup Take 5 mLs by mouth every 6 (six) hours as needed for cough. Patient taking differently: Take 5 mLs by mouth every 6 (six) hours as needed for cough.  09/16/16   Mackuen, Courteney Lyn, MD  hydrochlorothiazide (HYDRODIURIL) 25 MG tablet Take 1 tablet (25 mg total) by mouth daily. Patient not taking: Reported on 08/05/2017 02/09/15   Carollee Herter, Alferd Apa, DO  predniSONE (DELTASONE) 10 MG tablet Take 1 tablet (10 mg total) by  mouth daily. Patient not taking: Reported on 08/05/2017 09/16/16   Macarthur Critchley, MD    Physical Exam:   Vitals:   08/05/17 1339 08/05/17 1340 08/05/17 1837 08/05/17 2028  BP: 134/77  134/72 138/83  Pulse: 77  73 77  Resp: 20  18 18   Temp: 97.9 F (36.6 C)  (!) 97.5 F (36.4 C) 98 F (36.7 C)  TempSrc: Oral  Oral Axillary  SpO2: 95%  100% 100%  Weight:  94.4 kg (208 lb 2 oz)    Height:  5\' 4"  (1.626 m)       Physical Exam: Blood pressure 138/83, pulse 77, temperature 98 F (36.7 C), temperature source Axillary, resp. rate 18, height  5\' 4"  (1.626 m), weight 94.4 kg (208 lb 2 oz), SpO2 100 %. Gen: Pleasant, friendly, well-appearing female ambulating to the bathroom in no distress. Eyes: Sclerae anicteric. Conjunctiva mildly injected. Neck: Supple, no jugular venous distention. Chest: Moderately good air entry bilaterally with no adventitious sounds.  CV: Distant, regular, no audible murmurs. Abdomen: NABS, soft, nondistended, nontender. No tenderness to light or deep palpation. No rebound, no guarding. Extremities: No edema.  Skin: Warm and dry. No rashes, lesions or wounds. Neuro: Alert and oriented times 3, normal speech, normal gait. She has minimal weakness in left hand, no facial droop was noted, CN are intact bilaterally.   Psych: Patient is cooperative, logical and coherent with appropriate mood and affect.  Data Review:    Labs: Basic Metabolic Panel: Recent Labs  Lab 08/04/17 2349  NA 140  K 3.8  CL 106  CO2 26  GLUCOSE 98  BUN 17  CREATININE 1.01*  CALCIUM 9.2   Liver Function Tests: Recent Labs  Lab 08/04/17 2349  AST 22  ALT 21  ALKPHOS 93  BILITOT 0.5  PROT 7.2  ALBUMIN 3.5   No results for input(s): LIPASE, AMYLASE in the last 168 hours. No results for input(s): AMMONIA in the last 168 hours. CBC: Recent Labs  Lab 08/04/17 2349  WBC 6.9  NEUTROABS 4.3  HGB 13.1  HCT 39.0  MCV 88.4  PLT 275   Cardiac Enzymes: Recent Labs    Lab 08/04/17 2349  TROPONINI <0.03    BNP (last 3 results) No results for input(s): PROBNP in the last 8760 hours. CBG: No results for input(s): GLUCAP in the last 168 hours.  Urinalysis    Component Value Date/Time   COLORURINE YELLOW 08/04/2017 2350   APPEARANCEUR CLOUDY (A) 08/04/2017 2350   LABSPEC >1.030 (H) 08/04/2017 2350   PHURINE 5.5 08/04/2017 2350   GLUCOSEU NEGATIVE 08/04/2017 2350   HGBUR LARGE (A) 08/04/2017 2350   BILIRUBINUR NEGATIVE 08/04/2017 2350   BILIRUBINUR neg 06/27/2011 0944   KETONESUR 15 (A) 08/04/2017 2350   PROTEINUR NEGATIVE 08/04/2017 2350   UROBILINOGEN negative 06/27/2011 0944   NITRITE NEGATIVE 08/04/2017 2350   LEUKOCYTESUR SMALL (A) 08/04/2017 2350      Radiographic Studies: Ct Head Wo Contrast  Result Date: 08/04/2017 CLINICAL DATA:  Focal neuro deficit, < 6 hrs, stroke suspected. Numbness. EXAM: CT HEAD WITHOUT CONTRAST TECHNIQUE: Contiguous axial images were obtained from the base of the skull through the vertex without intravenous contrast. COMPARISON:  Most recent head CT 06/08/2017. FINDINGS: Brain: Remote lacunar infarct versus prominent perivascular space in the right basal ganglia. No intracranial hemorrhage, mass effect, or midline shift. No hydrocephalus. The basilar cisterns are patent. No evidence of territorial infarct or acute ischemia. No extra-axial or intracranial fluid collection. Vascular: No hyperdense vessel or unexpected calcification. Skull: No fracture or focal lesion. Sinuses/Orbits: Paranasal sinuses and mastoid air cells are clear. The visualized orbits are unremarkable. Other: Small right frontal scalp lipoma is unchanged. IMPRESSION: No acute intracranial abnormality. Electronically Signed   By: Jeb Levering M.D.   On: 08/04/2017 23:50   Mr Jodene Nam Head Wo Contrast  Result Date: 08/05/2017 CLINICAL DATA:  Right-sided facial numbness for 1 day. EXAM: MRI HEAD WITHOUT CONTRAST MRA HEAD WITHOUT CONTRAST TECHNIQUE:  Multiplanar, multiecho pulse sequences of the brain and surrounding structures were obtained without intravenous contrast. Angiographic images of the head were obtained using MRA technique without contrast. COMPARISON:  Head CT 08/04/2017, CTA 04/08/2012, and MRI/MRA 04/02/2012 FINDINGS: MRI HEAD  FINDINGS Brain: A metallic BB retained in the soft tissues of the right chin region results in extensive susceptibility artifact which obscures the right greater than left anterior frontal and temporal lobes on diffusion imaging. No acute infarct is identified within this limitation. There is no evidence of intracranial hemorrhage, mass, midline shift, or extra-axial fluid collection. The ventricles and sulci are normal. Small foci of T2 hyperintensity scattered throughout the subcortical and deep cerebral white matter bilaterally have progressed from 2013 and are nonspecific but compatible with mild chronic small vessel ischemic disease. There is a chronic lacunar infarct in the anterior limb of the right internal capsule/anterior putamen. A partially empty sella is again noted. Vascular: Major intracranial vascular flow voids are preserved. Skull and upper cervical spine: Unremarkable bone marrow signal. Sinuses/Orbits: Unremarkable orbits. Paranasal sinuses and mastoid air cells are clear. Other: Mildly prominent but subcentimeter lymph nodes scattered throughout the neck bilaterally, nonspecific. Small parotid/ periparotid nodules also most likely represent lymph nodes. MRA HEAD FINDINGS The visualized distal vertebral arteries are widely patent to the basilar and codominant. Patent PICAs and SCAs are identified bilaterally. The basilar artery is widely patent. There is a patent right posterior communicating artery. PCAs are patent without evidence of significant stenosis. The internal carotid arteries are patent from skullbase to carotid termini with mild left siphon irregularity but no evidence of significant  stenosis. ACAs and MCAs are patent without evidence of proximal branch occlusion or significant stenosis. No aneurysm is identified. IMPRESSION: 1. No acute intracranial abnormality. 2. Mild chronic small vessel ischemic disease, progressed from 2013. 3. Negative head MRA. Electronically Signed   By: Logan Bores M.D.   On: 08/05/2017 18:32   Mr Brain Wo Contrast  Result Date: 08/05/2017 CLINICAL DATA:  Right-sided facial numbness for 1 day. EXAM: MRI HEAD WITHOUT CONTRAST MRA HEAD WITHOUT CONTRAST TECHNIQUE: Multiplanar, multiecho pulse sequences of the brain and surrounding structures were obtained without intravenous contrast. Angiographic images of the head were obtained using MRA technique without contrast. COMPARISON:  Head CT 08/04/2017, CTA 04/08/2012, and MRI/MRA 04/02/2012 FINDINGS: MRI HEAD FINDINGS Brain: A metallic BB retained in the soft tissues of the right chin region results in extensive susceptibility artifact which obscures the right greater than left anterior frontal and temporal lobes on diffusion imaging. No acute infarct is identified within this limitation. There is no evidence of intracranial hemorrhage, mass, midline shift, or extra-axial fluid collection. The ventricles and sulci are normal. Small foci of T2 hyperintensity scattered throughout the subcortical and deep cerebral white matter bilaterally have progressed from 2013 and are nonspecific but compatible with mild chronic small vessel ischemic disease. There is a chronic lacunar infarct in the anterior limb of the right internal capsule/anterior putamen. A partially empty sella is again noted. Vascular: Major intracranial vascular flow voids are preserved. Skull and upper cervical spine: Unremarkable bone marrow signal. Sinuses/Orbits: Unremarkable orbits. Paranasal sinuses and mastoid air cells are clear. Other: Mildly prominent but subcentimeter lymph nodes scattered throughout the neck bilaterally, nonspecific. Small parotid/  periparotid nodules also most likely represent lymph nodes. MRA HEAD FINDINGS The visualized distal vertebral arteries are widely patent to the basilar and codominant. Patent PICAs and SCAs are identified bilaterally. The basilar artery is widely patent. There is a patent right posterior communicating artery. PCAs are patent without evidence of significant stenosis. The internal carotid arteries are patent from skullbase to carotid termini with mild left siphon irregularity but no evidence of significant stenosis. ACAs and MCAs are patent without evidence  of proximal branch occlusion or significant stenosis. No aneurysm is identified. IMPRESSION: 1. No acute intracranial abnormality. 2. Mild chronic small vessel ischemic disease, progressed from 2013. 3. Negative head MRA. Electronically Signed   By: Logan Bores M.D.   On: 08/05/2017 18:32    EKG: Independently reviewed. Normal sinus rhythm at 80, isolated T-wave inversion in 3.   Assessment/Plan:   Active Problems:   Asthma in adult   TIA (transient ischemic attack)  CVA Patient's symptoms seem to be resolving over time.  MRI and MRA done without any acute intracranial event noted.  Echocardiogram ordered. PT and OT consulted with a swallow screen requested. Aspirin was started per neurology. Statin is to be continued.  DVT Continue apixaban  HTN Will hold HCTZ  at present, permissive hypertensive per protocol.  HL Continue atorvastatin  PSYCH Continue Lexapro     Other information:   DVT prophylaxis: Fully anticoagulated on apixaban Code Status: Full code. Family Communication: Patient noted no need to further notify or discuss with her family Disposition Plan: Home Consults called: Neurology Admission status: Observation   The medical decision making is of moderate complexity, therefore this is a level 2 visit.  Dewaine Oats Derek Jack [facility] Pager [phone] Cell: [phone]   If 7PM-7AM,  please contact night-coverage [email or web address] Password [email or web address] 08/05/2017, 8:40 PM

## 2017-08-05 NOTE — Consult Note (Signed)
NEUROHOSPITALISTS - CONSULTATION NOTE   [facility]: Dr. Bonnell Public  Admit date: 08/04/2017    Chief Complaint: Left sided numbness  History obtained from:   Patient and Chart    HPI   Anita Benton is an 62 y.o. female  PMH of DVT- R leg 05/2017 on Eliquis, Hx of CVA x 2, HLD, Asthma, Obesity, recent history of Shingles presents to outside hospital on 08/04/2017 with complaints of left sided numbness. Dr Cheral Marker was contacted by phone and agreed to have the patient transferred to [facility] for stroke evaluation. Initial Head CT negative for acute findings.  Her initial symptoms at [facility] were Left facial numbness, slow speech and Left hand weakness. On exam this evening at [facility] patient is complaining of Left sided numbness on her entire body, worse on her face. She is complaining of a Left sided H/A 8/10 with photophobia, blurred vision or nausea. She has not asked for pain medication. She is complaining of Left sided hand weakness. No dysarthria or slow speech noted on exam. She is also complaining of being "itchy" all over her body. No active shingles lesions noted on face or body.  Date last known well: Date: 08/04/2017 Time last known well: Time: 21:30 tPA Given: No: outside the window Modified Rankin: Rankin Score=0 NIHSS:  Past Medical History: She  has a past medical history of Allergy, Arthritis, GERD (gastroesophageal reflux disease), Hyperlipidemia, Itching, Keloid, Menopause, Migraines (04/01/2012), Right leg DVT ([place]) (1980's), Shingles (04/01/2012), Shortness of breath (04/01/2012), Stroke ([place]) (1977; 04/01/2012), and Vitamin D deficiency. 05/2017 DVT- R leg on Eliquis, History of 2 other incidences of DVT Recent Left hairline Shingles on Jul 24, 2017 Hx of Migraines - last on in the 90's Prior stroke symptoms - dysarthria and right sided weakness. States the CVA in 1977 was from BCP's.  Past Surgical History: She  has  a past surgical history that includes Cesarean section (1974, 1977, 1979); Abdominal hysterectomy (1980's); Varicose vein surgery (1970's); Finger surgery (10/09/14); and Total hip arthroplasty (Right, 09-21-15).  Family History: indicated that the status of her mother is unknown. She indicated that the status of her father is unknown. She indicated that the status of her cousin is unknown. She indicated that the status of her neg hx is unknown. No known family history of stroke  Social History:  She  reports that  has never smoked. she has never used smokeless tobacco. She reports that she does not drink alcohol or use drugs.  Allergies:  Allergies  Allergen Reactions  . Codeine Rash  . Levofloxacin Itching and Swelling    Lip swelling  . Rice Shortness Of Breath and Rash   Medications: Current Meds  Medication Sig  . atorvastatin (LIPITOR) 10 MG tablet Take 1 tablet (10 mg total) by mouth daily.  . Cholecalciferol (VITAMIN D-1000 MAX ST) 1000 units tablet Take 1,000 Units by mouth daily.  . cyclobenzaprine (FLEXERIL) 5 MG tablet Take 5 mg by mouth as needed (Knee Pain).   Marland Kitchen ELIQUIS 5 MG TABS tablet Take 5 mg by mouth 2 (two) times daily.  Marland Kitchen escitalopram (LEXAPRO) 10 MG tablet Take 1 tablet (10 mg total) by mouth daily.  . montelukast (SINGULAIR) 10 MG tablet Take 1 tablet (10 mg total) by mouth at bedtime.  . MULTIPLE VITAMIN PO Take 1 tablet by mouth.  . pantoprazole (PROTONIX) 40 MG tablet TAKE 1 TABLET (40 MG TOTAL) BY MOUTH DAILY. TAKE 30-60 MIN BEFORE FIRST MEAL OF THE DAY  . [  DISCONTINUED] famotidine (PEPCID) 40 MG tablet Take 40 mg by mouth as needed.   ROS: History obtained from the patient General ROS: positive for - Left sided headache Psychological ROS: negative for - behavioral disorder, hallucinations, memory difficulties, mood swings or suicidal ideation Ophthalmic ROS: negative for - blurry vision, double vision, eye pain or loss of vision ENT ROS: negative for -  epistaxis, nasal discharge, oral lesions, sore throat, tinnitus or vertigo Allergy and Immunology ROS: negative for - hives or itchy/watery eyes Hematological and Lymphatic ROS: negative for - bleeding problems, bruising or swollen lymph nodes Endocrine ROS: negative for - galactorrhea, hair pattern changes, polydipsia/polyuria or temperature intolerance Respiratory ROS: negative for - cough, hemoptysis, shortness of breath or wheezing Cardiovascular ROS: negative for - chest pain, dyspnea on exertion, edema or irregular heartbeat Gastrointestinal ROS: negative for - abdominal pain, diarrhea, hematemesis, nausea/vomiting or stool incontinence Genito-Urinary ROS: negative for - dysuria, hematuria, incontinence or urinary frequency/urgency Musculoskeletal ROS: positive for - right leg pain - baseline finding Neurological ROS: as noted in HPI Dermatological ROS: positive for feeling "itchy" over her entire body  Physical Examination: Vitals:   08/05/17 1215 08/05/17 1230 08/05/17 1339 08/05/17 1340  BP:  128/80 134/77   Pulse: 76 74 77   Resp: 20 17 20    Temp:   97.9 F (36.6 C)   TempSrc:   Oral   SpO2: 93% 91% 95%   Weight:    94.4 kg (208 lb 2 oz)  Height:    5\' 4"  (1.626 m)   HEENT-  Normocephalic, Normal external eye/conjunctiva.  Normal external ears. Normal external nose, mucus membranes and septum.   Cardiovascular - Regular rate and rhythm  Respiratory - Lungs clear bilaterally. Non-labored breathing, No wheezing. Abdomen - soft and non-tender, BS normal Extremities- no edema or cyanosis Skin-warm and dry, no active shingles lesion noted  Neurological Examination Mental Status: Alert, oriented, thought content appropriate.  Speech fluent without evidence of aphasia or neglect.  Able to follow 3 step commands without difficulty. Cranial Nerves: II: Visual Fields are full. Pupils are equal, round, and reactive to light.   III,IV, VI: EOMI without ptosis or diploplia. No  nystagmus.   V: Facial sensation is decreased on left to touch and temperature VII: Facial movement is symmetric.  VIII: hearing is intact to voice X: Uvula elevates symmetrically XI: Shoulder shrug is symmetric. XII: tongue is midline without atrophy or fasciculations.  Motor: Tone is normal. Bulk is normal. 5/5 strength was present in all four extremities. Some possible Left hand weakness, with multiple testing patient displays give way on testing. Sensor: Sensation is decreased in face, arms and legs on Left side. Normal on Right Deep Tendon Reflexes: 2+ and symmetric throughout in the biceps and patellae Plantars: Toes are downgoing bilaterally.  Cerebellar: normal finger-to-nose Gait: normal gait and station   Lab Results: CBC: Recent Labs  Lab 08/04/17 2349  WBC 6.9  HGB 13.1  HCT 39.0  MCV 88.4  PLT 409   Basic Metabolic Panel: Recent Labs  Lab 08/04/17 2349  NA 140  K 3.8  CL 106  CO2 26  GLUCOSE 98  BUN 17  CREATININE 1.01*  CALCIUM 9.2   Liver Function Tests: Recent Labs  Lab 08/04/17 2349  AST 22  ALT 21  ALKPHOS 93  BILITOT 0.5  PROT 7.2  ALBUMIN 3.5   Cardiac Enzymes: Recent Labs  Lab 08/04/17 2349  TROPONINI <0.03   Coagulation Studies: Recent Labs  08/04/17 2349  APTT 38*  INR 1.12   Urinalysis:  Recent Labs  Lab 08/04/17 2350  COLORURINE YELLOW  APPEARANCEUR CLOUDY*  LABSPEC >1.030*  PHURINE 5.5  GLUCOSEU NEGATIVE  HGBUR LARGE*  BILIRUBINUR NEGATIVE  KETONESUR 15*  PROTEINUR NEGATIVE  NITRITE NEGATIVE  LEUKOCYTESUR SMALL*   Urine Drug Screen:      Component Value Date/Time   LABOPIA NONE DETECTED 08/04/2017 2350   COCAINSCRNUR NONE DETECTED 08/04/2017 2350   LABBENZ NONE DETECTED 08/04/2017 2350   AMPHETMU NONE DETECTED 08/04/2017 2350   THCU NONE DETECTED 08/04/2017 2350   LABBARB NONE DETECTED 08/04/2017 2350    Alcohol Level:  Recent Labs  Lab 08/04/17 2349  ETH <10   Imaging: Ct Head Wo  Contrast Result Date: 08/04/2017 IMPRESSION: No acute intracranial abnormality.     IMPRESSION: Ms. Jemina Scahill is a 62 y.o. female with PMH of DVT- R leg 05/2017 on Eliquis, Hx of CVA x 2, HLD, Asthma, Obesity, recent history of Shingles presents to outside hospital with complaints of left sided numbness. Dr Cheral Marker was contacted by phone and agreed to have the patient transferred to [facility] for stroke evaluation. Initial Head CT negative for acute findings. Patient continues to complain of Left sided numbness and now Left sided H/A. MRI completed and results pending  Suspected TIA vs complicated Migraine Suspected Etiology: small vessel Resultant Symptoms: Left sided numbness Stroke Risk Factors: hyperlipidemia Other Stroke Risk Factors: Advanced age, Obesity, Body mass index is 35.72 kg/m.  Hx stroke, Migraines  Outstanding Stroke Work-up Studies:     MRI of the Brain - completed, results pending Echocardiogram:                                                 B/L Carotid U/S:                                                      Stroke Labs  PLAN  08/05/2017: Frequent Neurochecks  Telemetry Monitoring NPO until passes Stroke swallow screen May need to consider B/L LE Duplex in AM to r/o DVT Continue Statin HgbA1C and Lipid Profile PT/OT/SLP Carotid dopplers and Echo Ongoing aggressive stroke risk factor management Patient will be counseled to be compliant with her antithrombotic medications Patient will be counseled on Lifestyle modifications including, Diet, Exercise, and Stress Follow up with [facility] in 6 weeks  HX OF STROKES: CVA 1970 CVA  2013  HYPERTENSION: Stable Permissive hypertension (OK if <220/120) for 24-48 hours post stroke and then gradually normalized within 5-7 days. Long term BP goal normotensive. May slowly start home B/P medications after 48 hours, if indicated Home Meds: NONE  HYPERLIPIDEMIA: Lipid Panel - PENDING Home  Meds:  Lipitor 10 mg LDL  goal < 70 Continued on  Lipitor to 10 mg daily for now Continue statin at discharge  DIABETES: No results for input(s): GLUCAP in the last 168 hours.- PENDING HgbA1c goal < 7.0 Currently on: PENDING Continue CBG monitoring and SSI to maintain glucose 140-180 mg/dl DM education   OBESITY Obesity, Body mass index is 35.72 kg/m. Greater than/equal to 30  Other Active Problems: Active Problems:   Asthma in adult  TIA (transient ischemic attack)    Hospital day # 0 VTE prophylaxis: Eliquis Diet - Diet Heart Room service appropriate? Yes; Fluid consistency: Thin   FAMILY UPDATES: family at bedside  TEAM UPDATES:[place], [place] [employer]ubl*   Prior Home Stroke Medications:  Eliquis (apixaban) daily  Discharge Stroke Meds:  Please discharge patient on Eliquis (apixaban) daily for now  Disposition: 01-Home or Self Care Therapy Recs:               PENDING Home Equipment:         PENDING Follow Up:  Schoenhoff, Altamese Cabal, MD -PCP Follow up in 1-2 weeks       Assessment and plan discussed with with attending physician and they are in agreement.    Mary Sella, ANP-C [facility] 08/05/2017, 4:04 PM  Please page stroke NP  Or  PA  Or MD from 8am -4 pm  as this patient from this time will be  followed by the stroke.   You can look them up on [email or web address]  Password [email or web address]    NEUROHOSPITALIST ADDENDUM Seen and examined the patient today. Formulated plan as documented above by ARNP  This is a TIA vs migraine. She is already on Eliquis. No need to add aspirin, however after she completes her 6 months of anti-correlation for DVT- she will need to start on aspirin.  Complete remaining stroke workup including carotid Doppler and echo.   Karena Addison Shealynn Saulnier MD [facility] [phone]  If 7pm to 7am, please call on call as listed on AMION.

## 2017-08-05 NOTE — Progress Notes (Signed)
62 yo female with pmhx significant for stroke, migraines, and DVT, apparently work up with left sided facial numbness and left upper arm weakness.  ED requesting   R/o TIA/ CVA.   Neurology requests to be called for consultation once patient arrives at [facility] per ED physician.

## 2017-08-05 NOTE — ED Notes (Signed)
Assumed care of patient from [place], [place]. Pt is awaiting bed placement at [facility]. Bed control contacted by Furniture conservator/restorer. Updated patient. Patient resting quietly. No distress. Ambulated to bathroom with standby assist. Call bell within reach.

## 2017-08-05 NOTE — ED Notes (Addendum)
Assisted pt to bathroom. Tolerated well. No weakness observed. Pupils 2 Brisk bilateral. Grips equal bilateral. Pt c/o knee pain. States she had knee surgery in Nov and usually takes tylenol. MD aware and orders received. Pt waiting on inpt bed. States swelling to her lips has improved. Slight swelling to left side of upper lip area noted. States the left side of her face has felt numb.  No new neuro symptoms noted. Pt is alert and oriented times 3. Husband at bedside.

## 2017-08-06 ENCOUNTER — Observation Stay (HOSPITAL_BASED_OUTPATIENT_CLINIC_OR_DEPARTMENT_OTHER): Payer: BLUE CROSS/BLUE SHIELD

## 2017-08-06 ENCOUNTER — Other Ambulatory Visit (HOSPITAL_COMMUNITY): Payer: Self-pay

## 2017-08-06 DIAGNOSIS — I82591 Chronic embolism and thrombosis of other specified deep vein of right lower extremity: Secondary | ICD-10-CM | POA: Diagnosis not present

## 2017-08-06 DIAGNOSIS — I361 Nonrheumatic tricuspid (valve) insufficiency: Secondary | ICD-10-CM

## 2017-08-06 DIAGNOSIS — G459 Transient cerebral ischemic attack, unspecified: Secondary | ICD-10-CM

## 2017-08-06 DIAGNOSIS — I82491 Acute embolism and thrombosis of other specified deep vein of right lower extremity: Secondary | ICD-10-CM

## 2017-08-06 DIAGNOSIS — R2990 Unspecified symptoms and signs involving the nervous system: Secondary | ICD-10-CM | POA: Diagnosis not present

## 2017-08-06 DIAGNOSIS — G43009 Migraine without aura, not intractable, without status migrainosus: Secondary | ICD-10-CM | POA: Diagnosis not present

## 2017-08-06 LAB — HEMOGLOBIN A1C
HEMOGLOBIN A1C: 5.2 % (ref 4.8–5.6)
MEAN PLASMA GLUCOSE: 102.54 mg/dL

## 2017-08-06 LAB — LIPID PANEL
CHOLESTEROL: 166 mg/dL (ref 0–200)
HDL: 34 mg/dL — AB (ref >40)
LDL CALC: 106 mg/dL — AB (ref 0–99)
TRIGLYCERIDES: 129 mg/dL (ref <150)
Total CHOL/HDL Ratio: 4.9 RATIO
VLDL: 26 mg/dL (ref 0–40)

## 2017-08-06 LAB — ECHOCARDIOGRAM COMPLETE
Height: 64 in
Weight: 3328 oz

## 2017-08-06 LAB — HIV ANTIBODY (ROUTINE TESTING W REFLEX): HIV SCREEN 4TH GENERATION: NONREACTIVE

## 2017-08-06 MED ORDER — ONDANSETRON HCL 4 MG/2ML IJ SOLN: 4.0000 mg | Freq: Three times a day (TID) | INTRAMUSCULAR | Status: DC | PRN

## 2017-08-06 MED ORDER — MORPHINE SULFATE (PF) 2 MG/ML IV SOLN
2.0000 mg | INTRAVENOUS | Status: DC | PRN
  Administered 2017-08-06: 2 mg via INTRAVENOUS
  Filled 2017-08-06: qty 1

## 2017-08-06 MED ORDER — DIVALPROEX SODIUM ER 500 MG PO TB24
500.0000 mg | ORAL_TABLET | Freq: Every day | ORAL | Status: DC
  Administered 2017-08-06 – 2017-08-07 (×2): 500 mg via ORAL
  Filled 2017-08-06 (×2): qty 1

## 2017-08-06 MED ORDER — KETOROLAC TROMETHAMINE 30 MG/ML IJ SOLN
30.0000 mg | Freq: Once | INTRAMUSCULAR | Status: AC
  Administered 2017-08-06: 30 mg via INTRAVENOUS
  Filled 2017-08-06: qty 1

## 2017-08-06 MED ORDER — ATORVASTATIN CALCIUM 10 MG PO TABS
20.0000 mg | ORAL_TABLET | Freq: Every day | ORAL | Status: DC
  Administered 2017-08-06: 20 mg via ORAL
  Filled 2017-08-06: qty 2

## 2017-08-06 NOTE — Evaluation (Signed)
Occupational Therapy Evaluation Patient Details Name: Anita Benton MRN: [id number] DOB: May 07, 1956 Today's Date: 08/06/2017    History of Present Illness 62 y.o. female presents with left-sided numbness, mainly at face, and slowed speech. Symptoms have grossly resolved besides continued facial numbenss/tingling. MRI and CT negative for acute changes. Dif dx includes TIA vs migraine. PMH significant of CVAx2, DVT right leg, right hip replacement, right knee arthroplasty, migraines, and shingles.     Clinical Impression   Pt was assessed by OT and participated in ADL session. Pt appears to be at or near baseline level for ADL and selfcare tasks. She has a/e, DME at home and her daughter and her daughters boyfriend are able to provide PRN assist. She was educated in use of her a/e and DME at d/c secondary to her c/o R knee pain since a surgery in 05/2017 which is not related to this admission. She reports some residual left facial numbness, but denies LUE numbness at this time. Her L UE A/ROM is WFL's as is functional use and coordination. No further needs were identified at this time, will sign off acute OT.    Follow Up Recommendations  No OT follow up;Supervision - Intermittent    Equipment Recommendations  None recommended by OT(Pt has DME and long handled A/E)    Recommendations for Other Services       Precautions / Restrictions Precautions Precautions: Fall Restrictions Weight Bearing Restrictions: No      Mobility Bed Mobility Overal bed mobility: Modified Independent Bed Mobility: Supine to Sit;Sit to Supine     Supine to sit: Modified independent (Device/Increase time) Sit to supine: Modified independent (Device/Increase time)   General bed mobility comments: Pt is able to perform bed mobility without physical assist, however moves slowly.   Transfers Overall transfer level: Needs assistance Equipment used: Rolling walker (2 wheeled) Transfers: Sit to/from  [facility] Sit to Stand: Supervision Stand pivot transfers: Min guard       General transfer comment: STS x1 from EOB, min guard to steady pt as pt's knee pain inhibiting her from putting full weight through RLE.     Balance Overall balance assessment: Needs assistance Sitting-balance support: No upper extremity supported;Feet supported Sitting balance-Leahy Scale: Good Sitting balance - Comments: Pt demosntrates sitting EOB, weight-shifting, and ability to hold trunk upright and steady during MMT of BLEs.   Standing balance support: No upper extremity supported;During functional activity Standing balance-Leahy Scale: Fair Standing balance comment: Pt demonstrates standing statically without support, however reaches out for things to steady herself for dynamic standing activities.                            ADL either performed or assessed with clinical judgement   ADL Overall ADL's : At baseline Eating/Feeding: Independent   Grooming: Standing;Modified independent Grooming Details (indicate cue type and reason): Standing with RW Upper Body Bathing: Modified independent;Sitting   Lower Body Bathing: Sit to/from stand;Supervison/ safety Lower Body Bathing Details (indicate cue type and reason): Uses shower chair & hand held shower head at home. Pt reports Mod I at home, daughter able to provide PRN assist but pt lives alone. Upper Body Dressing : Modified independent;Sitting   Lower Body Dressing: Modified independent;Sitting/lateral leans;Sit to/from stand;With adaptive equipment Lower Body Dressing Details (indicate cue type and reason): H/o CVA, Pt uses sock aid, LH reacher at home to assist with LB ADL's - mainly secondary to R knee pain following  knee surgery 05/2017 Toilet Transfer: Modified Independent;RW;Comfort height toilet;Ambulation;Grab bars Toilet Transfer Details (indicate cue type and reason): Pt has toilet riser with armrests at  home Toileting- Clothing Manipulation and Hygiene: Modified independent;Sit to/from stand;Sitting/lateral lean     Tub/Shower Transfer Details (indicate cue type and reason): Discussed tub transfer with pt. She has shower chair at home and was using hand held shower head. Recommend that her daughter be available for PRN supervision/assist when d/c. Pt verbalized understanding of this. Functional mobility during ADLs: Modified independent;Rolling walker General ADL Comments: Pt was assessed by OT and participated in ADL session. Pt appears to be at or near baseline level for ADL and selfcare tasks. She has a/e, DME at home and her daughter and her daughters boyfriend are able to provide PRN assist. She was educated in use of her a/e and DME at d/c secondary to her c/o R knee pain since a surgery in 05/2017 which is not related to this admission. She reports some residual left facial numbness, but denies LUE numbness at this time. Her L UE A/ROM is WFL's as is functional use and coordination. No further needs were identified at this time, will sign off acute OT.     Vision Baseline Vision/History: Wears glasses Wears Glasses: At all times Patient Visual Report: No change from baseline(L eye was "weak from my other strokes" Nothing different this time)       Perception     Praxis      Pertinent Vitals/Pain Pain Assessment: 0-10 Pain Score: 7  Pain Location: right knee Pain Descriptors / Indicators: Aching Pain Intervention(s): Limited activity within patient's tolerance;Monitored during session     Hand Dominance Right   Extremity/Trunk Assessment Upper Extremity Assessment Upper Extremity Assessment: Generalized weakness   Lower Extremity Assessment Lower Extremity Assessment: Defer to PT evaluation RLE Deficits / Details: limited knee extension ROM 2/2 knee arthroplasty in November 2018, lacking ~30 degrees (no Goni measurement taken, eyeball estimation)   Cervical / Trunk  Assessment Cervical / Trunk Assessment: Normal   Communication Communication Communication: No difficulties   Cognition Arousal/Alertness: Awake/alert Behavior During Therapy: WFL for tasks assessed/performed Overall Cognitive Status: Within Functional Limits for tasks assessed                                     General Comments       Exercises     Shoulder Instructions      Home Living Family/patient expects to be discharged to:: Private residence Living Arrangements: Alone Available Help at Discharge: Family;Friend(s);Available PRN/intermittently(Pt daughter and her boyfriend) Type of Home: House Home Access: Stairs to enter [employer] of Steps: 1 STE   Home Layout: One level     Bathroom Shower/Tub: Teacher, early years/pre: Standard     Home Equipment: Cane - single point;Shower seat;Hand held shower head;Toilet riser;Adaptive equipment(Toilet riser with armrests) Adaptive Equipment: Reacher;Sock aid;Other (Comment)(Pt reports that she uses a LH reacher or back scratcher to assist with her shoes secondary to R knee pain following recent knee surgery in 05/2017) Additional Comments: Pt works as Engineer, 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       Prior Functioning/Environment Level of Independence: Independent with assistive device(s)        Comments: Has been utilizing cane to get around when knee hurts.        OT Problem List:  OT Treatment/Interventions:      OT Goals(Current goals can be found in the care plan section) Acute Rehab OT Goals Patient Stated Goal: Go home with intermittent family assist OT Goal Formulation: All assessment and education complete, DC therapy  OT Frequency:     Barriers to D/C:            Co-evaluation              AM-PAC PT "6 Clicks" Daily Activity     Outcome Measure Help from another person eating meals?: None Help from another person taking care of  personal grooming?: None Help from another person toileting, which includes using toliet, bedpan, or urinal?: A Little Help from another person bathing (including washing, rinsing, drying)?: A Little Help from another person to put on and taking off regular upper body clothing?: None Help from another person to put on and taking off regular lower body clothing?: A Little 6 Click Score: 21   End of Session Equipment Utilized During Treatment: Gait belt;Rolling walker  Activity Tolerance: Patient tolerated treatment well Patient left: in bed;with call bell/phone within reach;with bed alarm set  OT Visit Diagnosis: Muscle weakness (generalized) (M62.81)                Time: 1505-6979 OT Time Calculation (min): 23 min Charges:  OT General Charges $OT Visit: 1 Visit OT Evaluation $OT Eval Moderate Complexity: 1 Mod G-Codes:      Debra Colon, [employer], OTR/L 08/06/2017, 11:36 AM

## 2017-08-06 NOTE — Discharge Summary (Signed)
Physician Discharge Summary  Anita Benton OEV:[id number] DOB: November 05, 1955 DOA: 08/04/2017  PCP: Lanice Shirts, MD  Admit date: 08/04/2017 Discharge date: 08/07/2017  Time spent: >35 minutes  Recommendations for Outpatient Follow-up:  F/u with PCP in 3-7 days as needed Discharge Diagnoses:  Active Problems:   Asthma in adult   Hyperlipidemia   DVT, lower extremity (HCC)   HTN (hypertension)   TIA (transient ischemic attack)   Stroke-like symptoms   Discharge Condition:  Stable   Diet recommendation: low sodium  Filed Weights   08/05/17 1340  Weight: 94.4 kg (208 lb 2 oz)    History of present illness:    62 y.o.femalePMH of DVT- R leg 05/2017 on Eliquis, Hx of CVA x 2, HLD, Asthma, Obesity, recent history of shingles presents to outside hospital on 08/04/2017 who was in her usual state of good health until 2 days prior to admission when she noted onset of numbness of her left hand and finger which was subsequently followed by left facial numbness and difficulty formulating words. She admits to sligh headache, denies head trauma.   ED Course:Head CT was negative. Patient discussed with Neurology and was not considered to be a TPA candidate, patient is now transferred here to complete workup.     Hospital Course:    Suspected TIA vs complicated Migraine. With Left sided numbness. Stroke Risk Factors:hyperlipidemia Other Stroke Risk Factors: Advanced age, Obesity,Body mass index is 35.72 kg/m.Hx stroke,Migraines  -MRI of the Brain: No acute intracranial abnormality.  Mild chronic small vessel ischemic disease, progressed from 2013. Negative head MRA.Echocardiogram:  -B/L Carotid U/S: -echo: Systolic function was   normal. The estimated ejection fraction was in the range of 60%   to 65%. Wall motion was normal; there were no regional wall   motion abnormalities.  -LDL-106. HDL-34. HA1c-5.2  -carotid US:  Final Interpretation: Right Carotid: The extracranial vessels were near-normal with only minimal wall   thickening or plaque. Left Carotid: The extracranial vessels were near-normal with only minimal wall      thickening or plaque. Vertebrals: Both vertebral arteries were patent with antegrade flow.  -ContinueStatin. Already on eliques for recent DVT. Recommended  to transition to aspirin after the completion of treatment course with apixaban for DVT. Ongoing aggressive stroke risk factor management. counseled on Lifestyle modifications including, Diet, Exercise, and Stress. Follow up with [facility] in 6 weeks  Recent DVT. To complete the treatment course with eliques.   Obesity,Body mass index is 35.72 kg/m.Greater than/equal to 30. Counseled regarding weight loss   Abnormal UA. No dysuria. No leukocytosis. No fevers. No s/s UTI.     Procedures: Echo:Study Conclusions  - Left ventricle: The cavity size was normal. Systolic function was   normal. The estimated ejection fraction was in the range of 60%   to 65%. Wall motion was normal; there were no regional wall   motion abnormalities. Left ventricular diastolic function   parameters were normal. - Pulmonary arteries: PA peak pressure: 38 mm Hg (S).  Impressions:  - The right ventricular systolic pressure was increased consistent    with mild pulmonary hypertension.  (i.e. Studies not automatically included, echos, thoracentesis, etc; not x-rays)  Consultations:  Neurology   Discharge Exam: Vitals:   08/06/17 0930 08/06/17 1353  BP: 106/74 116/65  Pulse: 77 72  Resp: 20 20  Temp: 98.2 F (36.8 C) 98.1 F (36.7 C)  SpO2: 100% 94%    General: alert. No distress  Cardiovascular: s1,s2 rrr  Respiratory: ACT BL  Discharge Instructions  Discharge Instructions    Ambulatory referral to Neurology   Complete by:  As directed    An appointment is requested in approximately: 6  wweks Follow up with stroke clinic Cecille Rubin preferred, if not available, then consider Caesar Chestnut, [facility] or Jaynee Eagles whoever is available) at [facility] in about 6-8 weeks. Thanks.     Allergies as of 08/06/2017      Reactions   Codeine Rash   Levofloxacin Itching, Swelling   Lip swelling   Rice Shortness Of Breath, Rash      Medication List    STOP taking these medications   doxycycline 100 MG capsule Commonly known as:  VIBRAMYCIN   guaiFENesin-codeine 100-10 MG/5ML syrup   hydrochlorothiazide 25 MG tablet Commonly known as:  HYDRODIURIL   predniSONE 10 MG tablet Commonly known as:  DELTASONE     TAKE these medications   albuterol 1.25 MG/3ML nebulizer solution Commonly known as:  ACCUNEB Take 1 ampule by nebulization every 6 (six) hours as needed for wheezing.   atorvastatin 10 MG tablet Commonly known as:  LIPITOR Take 1 tablet (10 mg total) by mouth daily.   Azelastine-Fluticasone 137-50 MCG/ACT Susp Commonly known as:  DYMISTA Place 1 spray into the nose 2 (two) times daily.   cyclobenzaprine 5 MG tablet Commonly known as:  FLEXERIL Take 5 mg by mouth as needed (Knee Pain).   ELIQUIS 5 MG Tabs tablet Generic drug:  apixaban Take 5 mg by mouth 2 (two) times daily.   escitalopram 10 MG tablet Commonly known as:  LEXAPRO Take 1 tablet (10 mg total) by mouth daily.   famotidine 20 MG tablet Commonly known as:  PEPCID TAKE 1 TABLET BY MOUTH AT BEDTIME What changed:    how much to take  how to take this  when to take this   montelukast 10 MG tablet Commonly known as:  SINGULAIR Take 1 tablet (10 mg total) by mouth at bedtime.   MULTIPLE VITAMIN PO Take 1 tablet by mouth.   pantoprazole 40 MG tablet Commonly known as:  PROTONIX TAKE 1 TABLET (40 MG TOTAL) BY MOUTH DAILY. TAKE 30-60 MIN BEFORE FIRST MEAL OF THE DAY   VITAMIN D-1000 MAX ST 1000 units tablet Generic drug:  Cholecalciferol Take 1,000 Units by mouth daily.      Allergies   Allergen Reactions  . Codeine Rash  . Levofloxacin Itching and Swelling    Lip swelling  . Rice Shortness Of Breath and Rash   Follow-up Information    Anita Bible, NP. Schedule an appointment as soon as possible for a visit in 6 week(s).   Specialty:  Family Medicine Contact information: [address] [place] [address] [phone]            The results of significant diagnostics from this hospitalization (including imaging, microbiology, ancillary and laboratory) are listed below for reference.    Significant Diagnostic Studies: Ct Head Wo Contrast  Result Date: 08/04/2017 CLINICAL DATA:  Focal neuro deficit, < 6 hrs, stroke suspected. Numbness. EXAM: CT HEAD WITHOUT CONTRAST TECHNIQUE: Contiguous axial images were obtained from the base of the skull through the vertex without intravenous contrast. COMPARISON:  Most recent head CT 06/08/2017. FINDINGS: Brain: Remote lacunar infarct versus prominent perivascular space in the right basal ganglia. No intracranial hemorrhage, mass effect, or midline shift. No hydrocephalus. The basilar cisterns are patent. No evidence of territorial infarct or acute ischemia. No extra-axial or intracranial  fluid collection. Vascular: No hyperdense vessel or unexpected calcification. Skull: No fracture or focal lesion. Sinuses/Orbits: Paranasal sinuses and mastoid air cells are clear. The visualized orbits are unremarkable. Other: Small right frontal scalp lipoma is unchanged. IMPRESSION: No acute intracranial abnormality. Electronically Signed   By: Jeb Levering M.D.   On: 08/04/2017 23:50   Mr Jodene Nam Head Wo Contrast  Result Date: 08/05/2017 CLINICAL DATA:  Right-sided facial numbness for 1 day. EXAM: MRI HEAD WITHOUT CONTRAST MRA HEAD WITHOUT CONTRAST TECHNIQUE: Multiplanar, multiecho pulse sequences of the brain and surrounding structures were obtained without intravenous contrast. Angiographic images of the head were  obtained using MRA technique without contrast. COMPARISON:  Head CT 08/04/2017, CTA 04/08/2012, and MRI/MRA 04/02/2012 FINDINGS: MRI HEAD FINDINGS Brain: A metallic BB retained in the soft tissues of the right chin region results in extensive susceptibility artifact which obscures the right greater than left anterior frontal and temporal lobes on diffusion imaging. No acute infarct is identified within this limitation. There is no evidence of intracranial hemorrhage, mass, midline shift, or extra-axial fluid collection. The ventricles and sulci are normal. Small foci of T2 hyperintensity scattered throughout the subcortical and deep cerebral white matter bilaterally have progressed from 2013 and are nonspecific but compatible with mild chronic small vessel ischemic disease. There is a chronic lacunar infarct in the anterior limb of the right internal capsule/anterior putamen. A partially empty sella is again noted. Vascular: Major intracranial vascular flow voids are preserved. Skull and upper cervical spine: Unremarkable bone marrow signal. Sinuses/Orbits: Unremarkable orbits. Paranasal sinuses and mastoid air cells are clear. Other: Mildly prominent but subcentimeter lymph nodes scattered throughout the neck bilaterally, nonspecific. Small parotid/ periparotid nodules also most likely represent lymph nodes. MRA HEAD FINDINGS The visualized distal vertebral arteries are widely patent to the basilar and codominant. Patent PICAs and SCAs are identified bilaterally. The basilar artery is widely patent. There is a patent right posterior communicating artery. PCAs are patent without evidence of significant stenosis. The internal carotid arteries are patent from skullbase to carotid termini with mild left siphon irregularity but no evidence of significant stenosis. ACAs and MCAs are patent without evidence of proximal branch occlusion or significant stenosis. No aneurysm is identified. IMPRESSION: 1. No acute  intracranial abnormality. 2. Mild chronic small vessel ischemic disease, progressed from 2013. 3. Negative head MRA. Electronically Signed   By: Logan Bores M.D.   On: 08/05/2017 18:32   Mr Brain Wo Contrast  Result Date: 08/05/2017 CLINICAL DATA:  Right-sided facial numbness for 1 day. EXAM: MRI HEAD WITHOUT CONTRAST MRA HEAD WITHOUT CONTRAST TECHNIQUE: Multiplanar, multiecho pulse sequences of the brain and surrounding structures were obtained without intravenous contrast. Angiographic images of the head were obtained using MRA technique without contrast. COMPARISON:  Head CT 08/04/2017, CTA 04/08/2012, and MRI/MRA 04/02/2012 FINDINGS: MRI HEAD FINDINGS Brain: A metallic BB retained in the soft tissues of the right chin region results in extensive susceptibility artifact which obscures the right greater than left anterior frontal and temporal lobes on diffusion imaging. No acute infarct is identified within this limitation. There is no evidence of intracranial hemorrhage, mass, midline shift, or extra-axial fluid collection. The ventricles and sulci are normal. Small foci of T2 hyperintensity scattered throughout the subcortical and deep cerebral white matter bilaterally have progressed from 2013 and are nonspecific but compatible with mild chronic small vessel ischemic disease. There is a chronic lacunar infarct in the anterior limb of the right internal capsule/anterior putamen. A partially empty sella is again  noted. Vascular: Major intracranial vascular flow voids are preserved. Skull and upper cervical spine: Unremarkable bone marrow signal. Sinuses/Orbits: Unremarkable orbits. Paranasal sinuses and mastoid air cells are clear. Other: Mildly prominent but subcentimeter lymph nodes scattered throughout the neck bilaterally, nonspecific. Small parotid/ periparotid nodules also most likely represent lymph nodes. MRA HEAD FINDINGS The visualized distal vertebral arteries are widely patent to the basilar and  codominant. Patent PICAs and SCAs are identified bilaterally. The basilar artery is widely patent. There is a patent right posterior communicating artery. PCAs are patent without evidence of significant stenosis. The internal carotid arteries are patent from skullbase to carotid termini with mild left siphon irregularity but no evidence of significant stenosis. ACAs and MCAs are patent without evidence of proximal branch occlusion or significant stenosis. No aneurysm is identified. IMPRESSION: 1. No acute intracranial abnormality. 2. Mild chronic small vessel ischemic disease, progressed from 2013. 3. Negative head MRA. Electronically Signed   By: Logan Bores M.D.   On: 08/05/2017 18:32    Microbiology: No results found for this or any previous visit (from the past 240 hour(s)).   Labs: Basic Metabolic Panel: Recent Labs  Lab 08/04/17 2349  NA 140  K 3.8  CL 106  CO2 26  GLUCOSE 98  BUN 17  CREATININE 1.01*  CALCIUM 9.2   Liver Function Tests: Recent Labs  Lab 08/04/17 2349  AST 22  ALT 21  ALKPHOS 93  BILITOT 0.5  PROT 7.2  ALBUMIN 3.5   No results for input(s): LIPASE, AMYLASE in the last 168 hours. No results for input(s): AMMONIA in the last 168 hours. CBC: Recent Labs  Lab 08/04/17 2349  WBC 6.9  NEUTROABS 4.3  HGB 13.1  HCT 39.0  MCV 88.4  PLT 275   Cardiac Enzymes: Recent Labs  Lab 08/04/17 2349  TROPONINI <0.03   BNP: BNP (last 3 results) No results for input(s): BNP in the last 8760 hours.  ProBNP (last 3 results) No results for input(s): PROBNP in the last 8760 hours.  CBG: No results for input(s): GLUCAP in the last 168 hours.     SignedKinnie Feil  [facility] 08/07/2017, 9:28 AM

## 2017-08-06 NOTE — Evaluation (Signed)
Physical Therapy Evaluation Patient Details Name: Anita Benton MRN: [id number] DOB: [birth year] Today's Date: 08/06/2017   History of Present Illness  62 y.o. female presents with left-sided numbness, mainly at face, and slowed speech. Symptoms have grossly resolved besides continued facial numbenss/tingling. MRI and CT negative for acute changes. Dif dx includes TIA vs migraine. PMH significant of CVAx2, DVT right leg, right hip replacement, right knee arthroplasty, migraines, and shingles.    Clinical Impression  Pt presents with impaired sensation (left-face numbness), decreased balance, impaired mobility, decreased right knee extension, pain at right knee, and decreased knowledge of proper DME use. Pt tolerated 120-ft of ambulation with min guard assist during today's session. Pt reports that she sometimes uses cane on right side at home when she experiences right knee pain. Educated pt on using cane on left-side in order to help unload right knee. Educated pt on importance of moving right knee in order to lubricate joint and improve ROM. Pt has intermittent assistance from daughter and boyfriend, however lives alone. Pt will benefit from outpatient PT upon discharge in order to improve knee symptoms and ensure safe mobility around household. PT will follow acutely while in hospital setting in order to maximize independence and improve upon aforementioned deficits.     Follow Up Recommendations Outpatient PT;Supervision for mobility/OOB    Equipment Recommendations  None recommended by PT    Recommendations for Other Services       Precautions / Restrictions Restrictions Weight Bearing Restrictions: No      Mobility  Bed Mobility Overal bed mobility: Modified Independent Bed Mobility: Supine to Sit     Supine to sit: Modified independent (Device/Increase time)     General bed mobility comments: Pt is able to perform bed mobility without physical assist, however moves slowly.    Transfers Overall transfer level: Needs assistance   Transfers: Sit to/from Stand Sit to Stand: Min guard         General transfer comment: STS x1 from EOB, min guard to steady pt as pt's knee pain inhibiting her from putting full weight through RLE.   Ambulation/Gait Ambulation/Gait assistance: Min guard Ambulation Distance (Feet): 120 Feet Assistive device: None Gait Pattern/deviations: Step-through pattern;Decreased step length - left;Decreased weight shift to right;Antalgic;Decreased step length - right Gait velocity: decreased Gait velocity interpretation: Below normal speed for age/gender General Gait Details: Pt with antalgic gait due to right knee pain. Pt ambulates slowly with short steps, however appears generally steady.   Stairs            Wheelchair Mobility    Modified Rankin (Stroke Patients Only) Modified Rankin (Stroke Patients Only) Pre-Morbid Rankin Score: No significant disability Modified Rankin: Moderate disability     Balance Overall balance assessment: Needs assistance Sitting-balance support: No upper extremity supported;Feet supported Sitting balance-Leahy Scale: Good Sitting balance - Comments: Pt demosntrates sitting EOB, weight-shifting, and ability to hold trunk upright and steady during MMT of BLEs.     Standing balance-Leahy Scale: Fair Standing balance comment: Pt demonstrates standing statically without support, however reaches out for things to steady herself for dynamic standing activities.                              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     Prior Function Level of Independence: Independent with assistive device(s)         Comments: Has been utilizing cane to get around when knee hurts.     Hand Dominance        Extremity/Trunk Assessment   Upper Extremity Assessment Upper Extremity Assessment: Generalized weakness;Defer to OT evaluation    Lower Extremity Assessment Lower Extremity Assessment: RLE deficits/detail RLE Deficits / Details: limited knee extension ROM 2/2 knee arthroplasty in November 2018, lacking ~30 degrees (no Goni measurement taken, eyeball estimation)    Cervical / Trunk Assessment Cervical / Trunk Assessment: Normal  Communication   Communication: No difficulties  Cognition Arousal/Alertness: Awake/alert Behavior During Therapy: WFL for tasks assessed/performed Overall Cognitive Status: Within Functional Limits for tasks assessed                                        General Comments      Exercises     Assessment/Plan    PT Assessment Patient needs continued PT services  PT Problem List Decreased mobility;Decreased range of motion;Decreased knowledge of use of DME;Decreased balance;Impaired sensation;Pain       PT Treatment Interventions DME instruction;Functional mobility training;Balance training;Patient/family education;Gait training;Therapeutic activities;Neuromuscular re-education;Stair training;Therapeutic exercise    PT Goals (Current goals can be found in the Care Plan section)  Acute Rehab PT Goals Patient Stated Goal: to go home PT Goal Formulation: With patient Time For Goal Achievement: 08/20/17 Potential to Achieve Goals: Good    Frequency Min 3X/week   Barriers to discharge        Co-evaluation               AM-PAC PT "6 Clicks"  Daily Activity  Outcome Measure Difficulty turning over in bed (including adjusting bedclothes, sheets and blankets)?: None Difficulty moving from lying on back to sitting on the side of the bed? : None Difficulty sitting down on and standing up from a chair with arms (e.g., wheelchair, bedside commode, etc,.)?: A Little Help needed moving to and from a bed to chair (including a wheelchair)?: A Little Help needed walking in hospital room?: A Little Help needed climbing 3-5 steps with a railing? : A Little 6 Click Score: 20    End of Session Equipment Utilized During Treatment: Gait belt Activity Tolerance: Patient tolerated treatment well Patient left: in chair;with call bell/phone within reach   PT Visit Diagnosis: Unsteadiness on feet (R26.81);Other abnormalities of gait and mobility (R26.89);Pain Pain - Right/Left: Right Pain - part of body: Knee    Time: 5400-8676 PT Time Calculation (min) (ACUTE ONLY): 14 min   Charges:   PT Evaluation $PT Eval Low Complexity: 1 Low     PT G Codes:        Judee Clara, SPT  Judee Clara 08/06/2017, 9:25 AM

## 2017-08-06 NOTE — Progress Notes (Signed)
VASCULAR LAB PRELIMINARY  PRELIMINARY  PRELIMINARY  PRELIMINARY  Carotid duplex completed.    Preliminary report:  1-39% ICA plaquing.  Vertebral artery flow is antegrade.   Durwin Davisson, RVT 08/06/2017, 3:48 PM

## 2017-08-06 NOTE — Progress Notes (Signed)
SLP Cancellation Note  Patient Details Name: Anita Benton MRN: [id number] DOB: November 27, 1955   Cancelled treatment:       Reason Eval/Treat Not Completed: SLP screened, no needs identified, will sign off   [name] 08/06/2017, 1:19 PM

## 2017-08-06 NOTE — Plan of Care (Signed)
  Progressing Education: Knowledge of disease or condition will improve 08/06/2017 0931 - Progressing by Harlin Heys, RN Knowledge of secondary prevention will improve 08/06/2017 0931 - Progressing by Harlin Heys, RN Knowledge of patient specific risk factors addressed and post discharge goals established will improve 08/06/2017 0931 - Progressing by Harlin Heys, RN Education: Knowledge of General Education information will improve 08/06/2017 0931 - Progressing by Harlin Heys, [place] Behavior/Discharge Planning: Ability to manage health-related needs will improve 08/06/2017 0931 - Progressing by Harlin Heys, RN Clinical Measurements: Ability to maintain clinical measurements within normal limits will improve 08/06/2017 0931 - Progressing by Harlin Heys, RN Will remain free from infection 08/06/2017 0931 - Progressing by Harlin Heys, RN Diagnostic test results will improve 08/06/2017 0931 - Progressing by Harlin Heys, RN Respiratory complications will improve 08/06/2017 0931 - Progressing by Harlin Heys, RN Cardiovascular complication will be avoided 08/06/2017 0931 - Progressing by Harlin Heys, RN Activity: Risk for activity intolerance will decrease 08/06/2017 0931 - Progressing by Harlin Heys, RN Nutrition: Adequate nutrition will be maintained 08/06/2017 0931 - Progressing by Harlin Heys, RN Coping: Level of anxiety will decrease 08/06/2017 0931 - Progressing by Harlin Heys, RN Elimination: Will not experience complications related to bowel motility 08/06/2017 0931 - Progressing by Harlin Heys, RN Will not experience complications related to urinary retention 08/06/2017 0931 - Progressing by Harlin Heys, RN Pain Managment: General experience of comfort will improve 08/06/2017 0931 - Progressing by Harlin Heys, RN Safety: Ability to remain free from injury will improve 08/06/2017 0931 - Progressing by Harlin Heys, RN Skin  Integrity: Risk for impaired skin integrity will decrease 08/06/2017 0931 - Progressing by Harlin Heys, RN

## 2017-08-06 NOTE — Progress Notes (Signed)
NEUROHOSPITALISTS - STROKE TEAM DAILY PROGRESS NOTE  ADMISSION HISTORY Anita Benton is an 62 y.o. female  PMH of DVT- R leg 05/2017 on Eliquis, Hx of CVA x 2, HLD, Asthma, Obesity, recent history of Shingles presents to outside hospital on 08/04/2017 with complaints of left sided numbness. Dr Cheral Marker was contacted by phone and agreed to have the patient transferred to [facility] for stroke evaluation. Initial Head CT negative for acute findings.  Her initial symptoms at [facility] were Left facial numbness, slow speech and Left hand weakness. On exam this evening at [facility] patient is complaining of Left sided numbness on her entire body, worse on her face. She is complaining of a Left sided H/A 8/10 with photophobia, blurred vision or nausea. She has not asked for pain medication. She is complaining of Left sided hand weakness. No dysarthria or slow speech noted on exam. She is also complaining of being "itchy" all over her body. No active shingles lesions noted on face or body.  Date last known well: Date: 08/04/2017 Time last known well: Time: 21:30 tPA Given: No: outside the window Modified Rankin: Rankin Score=0   SUBJECTIVE: No family at bedside. Patient found laying in bed in NAD. Patient voices no new concerns. States her symptoms are improving today but continues to have H/A. No new/acute events reported overnight. She states she had 2 similar episodes in the last 2 weeks which is more transient than the current one  Physical Examination: Vitals:   08/05/17 1837 08/05/17 2028 08/06/17 0624 08/06/17 0930  BP: 134/72 138/83 124/77 106/74  Pulse: 73 77 69 77  Resp: 18 18 18 20   Temp: (!) 97.5 F (36.4 C) 98 F (36.7 C) 97.9 F (36.6 C) 98.2 F (36.8 C)  TempSrc: Oral Axillary Oral Oral  SpO2: 100% 100% 98% 100%  Weight:      Height:       HEENT-  Normocephalic, Normal external eye/conjunctiva.  Normal external ears. Normal external nose, mucus  membranes and septum.   Cardiovascular - Regular rate and rhythm  Respiratory - Lungs clear bilaterally. Non-labored breathing, No wheezing. Abdomen - soft and non-tender, BS normal Extremities- no edema or cyanosis Skin-warm and dry, no active shingles lesion noted  Neurological Examination Mental Status: Alert, oriented, thought content appropriate.  Speech fluent without evidence of aphasia or neglect.  Able to follow 3 step commands without difficulty. Cranial Nerves: II: Visual Fields are full. Pupils are equal, round, and reactive to light.   III,IV, VI: EOMI without ptosis or diploplia. No nystagmus.   V: Facial sensation is decreased on left to touch and temperature, improved from yesterdays exam VII: Facial movement is symmetric.  VIII: hearing is intact to voice X: Uvula elevates symmetrically XI: Shoulder shrug is symmetric. XII: tongue is midline without atrophy or fasciculations.  Motor: Tone is normal. Bulk is normal. 5/5 strength was present in all four extremities. No Left hand weakness on exam today, Sensor: Sensation is decreased only lower Left side of face today. Normal on Right Deep Tendon Reflexes: 2+ and symmetric throughout in the biceps and patellae Plantars: Toes are downgoing bilaterally.  Cerebellar: normal finger-to-nose Gait: normal gait and station   Lab Results: CBC: Recent Labs  Lab 08/04/17 2349  WBC 6.9  HGB 13.1  HCT 39.0  MCV 88.4  PLT 778   Basic Metabolic Panel: Recent Labs  Lab 08/04/17 2349  NA 140  K 3.8  CL 106  CO2 26  GLUCOSE 98  BUN 17  CREATININE 1.01*  CALCIUM 9.2   Liver Function Tests: Recent Labs  Lab 08/04/17 2349  AST 22  ALT 21  ALKPHOS 93  BILITOT 0.5  PROT 7.2  ALBUMIN 3.5   Imaging: Ct Head Wo Contrast Result Date: 08/04/2017 IMPRESSION: No acute intracranial abnormality.   MRI/MRA Head/Brain 08/05/2017 IMPRESSION: 1. No acute intracranial abnormality. 2. Mild chronic small vessel ischemic  disease, progressed from 2013. 3. Negative head MRA  ECHO 08/06/2017   Carotid Duplex 08/06/2017     IMPRESSION: [name] is a 62 y.o. female with PMH of DVT- R leg 05/2017 on Eliquis, Hx of CVA x 2, HLD, Asthma, Obesity, recent history of Shingles presents to outside hospital with complaints of left sided numbness. Dr Cheral Marker was contacted by phone and agreed to have the patient transferred to [facility] for stroke evaluation. Initial Head CT negative for acute findings. Patient continues to complain of Left sided numbness and now Left sided H/A. MRI negative for acute stroke  Suspected TIA vs complicated Migraine Suspected Etiology: small vessel disease Resultant Symptoms: Left sided numbness Stroke Risk Factors: hyperlipidemia Other Stroke Risk Factors: Advanced age, Obesity, Body mass index is 35.72 kg/m.  Hx stroke, Migraines  Outstanding Stroke Work-up Studies:    Work up completed       ASSESSMENT:      08/06/2017 Neuro exam improved. Patient states her numbness is much improved and weakness resolved today. Continues to complain of H/A. Reviewed labs, imaging and POC with patient. Work up complete.  Likely to be discharged later today.                                             PLAN  08/06/2017: Discharge patient home with close PCP follow up Continue Statin Continue Eliquis, once discontinued will need to start on ASA 81 mg daily Ongoing aggressive stroke risk factor management Patient will be counseled to be compliant with her antithrombotic medications Patient will be counseled on Lifestyle modifications including, Diet, Exercise, and Stress Follow up with [facility] in 6 weeks Continue Eliquis for recent DVT and Lipitor. Recommend switch eliquis to aspirin after DVT treatment is completed  HX OF STROKES: CVA 1970 CVA  2013  HYPERTENSION: Stable Permissive hypertension (OK if <220/120) for 24-48 hours post stroke and then gradually  normalized within 5-7 days. Long term BP goal normotensive. May slowly start home B/P medications after 48 hours, if indicated Home Meds: NONE  HYPERLIPIDEMIA: Home Meds:  Lipitor 10 mg LDL  goal < 70, LDL 106 today Continued on  Lipitor to 20 mg daily Continue statin at discharge  R/O DIABETES HgbA1c goal < 7.0 - 5.2 today  OBESITY Obesity, Body mass index is 35.72 kg/m. Greater than/equal to 30  Other Active Problems: Active Problems:   Asthma in adult   Hyperlipidemia   DVT, lower extremity (HCC)   HTN (hypertension)   TIA (transient ischemic attack)   Stroke-like symptoms    Hospital day # 1 VTE prophylaxis: Eliquis Diet - Diet Heart Room service appropriate? Yes; Fluid consistency: Thin   FAMILY UPDATES: No family at bedside  TEAM UPDATES:Buriev, Arie Sabina, MD   Prior Home Stroke Medications:  Eliquis (apixaban) daily  Discharge Stroke Meds:  Please discharge patient on Eliquis (apixaban) daily for now, once Eliquis is discontinued patient should start taking ASA 81  mg daily for stroke prevention  Disposition: 01-Home or Self Care Therapy Recs:               Home Home Equipment:         None Follow Up:  Follow-up Information    [name], NP. Schedule an appointment as soon as possible for a visit in 6 week(s).   Specialty:  Family Medicine Contact information: [address] [place] [address] [phone]          Lanice Shirts, MD -PCP Follow up in 1-2 weeks       Assessment and plan discussed with with attending physician and they are in agreement.   Neurology to sign-off. Please call for any further questions or concerns. Thank you for this consultation.  Mary Sella, ANP-C Neurology Stroke Team 08/06/2017, 12:08 PM   I have personally examined this patient, reviewed notes, independently viewed imaging studies, participated in medical decision making and plan of care.ROS completed by me personally and  pertinent positives fully documented  I have made any additions or clarifications directly to the above note. Agree with note above. Clinical presentation with recurrent paresthesia and headache with mild weakness failure complicated migraine but TIA due to small vessel disease is also possibility. Recommend eliquis for now. DVT treatment is completed then switched to aspirin. Greater than 50% time during this 25 minute visit was spent on counseling and coordination of care about migraine versus TIA and answering questions.  Antony Contras, MD Medical Director [place] Pager: [phone] 08/06/2017 3:04 PM

## 2017-08-06 NOTE — Progress Notes (Signed)
Echocardiogram 2D Echocardiogram has been performed.  Anita Benton 08/06/2017, 3:39 PM

## 2017-08-06 NOTE — Progress Notes (Signed)
Patient with non resolving headaches. requiring iv mediations. tried Depakote, Toradol. wil try some morphine. She denies any allergies to morphine. Will monitor overnight.  Kinnie Feil

## 2017-08-06 NOTE — Progress Notes (Signed)
[facility] PROGRESS NOTE  Anita Benton HWE:[id number] DOB: 1955-09-28 DOA: 08/04/2017 PCP: Lanice Shirts, MD  Brief summary   62 y.o. female  PMH of DVT- R leg 05/2017 on Eliquis, Hx of CVA x 2, HLD, Asthma, Obesity, recent history of shingles presents to outside hospital on 08/04/2017 who was in her usual state of good health until 2 days prior to admission when she noted onset of numbness of her left hand and finger which was subsequently followed by left facial numbness and difficulty formulating words. She admits to sligh headache, denies head trauma.   ED Course:  Head CT was negative. Patient discussed with Neurology and was not considered to be a TPA candidate, patient is now transferred here to complete workup.    Assessment/Plan:  Suspected TIA vs complicated Migraine. With Left sided numbness. Stroke Risk Factors: hyperlipidemia Other Stroke Risk Factors: Advanced age, Obesity, Body mass index is 35.72 kg/m. Hx stroke, Migraines  -MRI of the Brain : No acute intracranial abnormality.  Mild chronic small vessel ischemic disease, progressed from 2013. Negative head MRA.Echocardiogram:                                                 -B/L Carotid U/S:     -echo:  -LDL-106. HDL-34. HA1c-5.2   -Continue Statin. Already on eliques for recent DVT. Recommended  to transition to aspirin after the completion of treatment course with apixaban for DVT. Ongoing aggressive stroke risk factor management. counseled on Lifestyle modifications including, Diet, Exercise, and Stress Follow up with [facility] in 6 weeks  Recent DVT. To complete the treatment course with eliques.   OBESITY Obesity, Body mass index is 35.72 kg/m. Greater than/equal to 30  Abnormal UA. No dysuria. No leukocytosis. No fevers. No s/s UTI. monitor     Code Status: full Family Communication: d/w patient, RN (indicate person spoken with, relationship, and if by phone, the  number) Disposition Plan: home 24-48 hrs    Consultants:  Neurology   Procedures:  Echo   Antibiotics:  none (indicate start date, and stop date if known)  HPI/Subjective: Alert. Reports some itching. Paresthesias are improving   Objective: Vitals:   08/05/17 2028 08/06/17 0624  BP: 138/83 124/77  Pulse: 77 69  Resp: 18 18  Temp: 98 F (36.7 C) 97.9 F (36.6 C)  SpO2: 100% 98%    Intake/Output Summary (Last 24 hours) at 08/06/2017 0823 Last data filed at 08/05/2017 1700 Gross per 24 hour  Intake 240 ml  Output -  Net 240 ml   Filed Weights   08/05/17 1340  Weight: 94.4 kg (208 lb 2 oz)    Exam:   General:  No distress   Cardiovascular: s1,s2 rrr  Respiratory: CTA BL  Abdomen: soft, nt, nd   Musculoskeletal: no leg edema    Data Reviewed: Basic Metabolic Panel: Recent Labs  Lab 08/04/17 2349  NA 140  K 3.8  CL 106  CO2 26  GLUCOSE 98  BUN 17  CREATININE 1.01*  CALCIUM 9.2   Liver Function Tests: Recent Labs  Lab 08/04/17 2349  AST 22  ALT 21  ALKPHOS 93  BILITOT 0.5  PROT 7.2  ALBUMIN 3.5   No results for input(s): LIPASE, AMYLASE in the last 168 hours. No results for input(s): AMMONIA in the last 168 hours.  CBC: Recent Labs  Lab 08/04/17 2349  WBC 6.9  NEUTROABS 4.3  HGB 13.1  HCT 39.0  MCV 88.4  PLT 275   Cardiac Enzymes: Recent Labs  Lab 08/04/17 2349  TROPONINI <0.03   BNP (last 3 results) No results for input(s): BNP in the last 8760 hours.  ProBNP (last 3 results) No results for input(s): PROBNP in the last 8760 hours.  CBG: No results for input(s): GLUCAP in the last 168 hours.  No results found for this or any previous visit (from the past 240 hour(s)).   Studies: Ct Head Wo Contrast  Result Date: 08/04/2017 CLINICAL DATA:  Focal neuro deficit, < 6 hrs, stroke suspected. Numbness. EXAM: CT HEAD WITHOUT CONTRAST TECHNIQUE: Contiguous axial images were obtained from the base of the skull through the  vertex without intravenous contrast. COMPARISON:  Most recent head CT 06/08/2017. FINDINGS: Brain: Remote lacunar infarct versus prominent perivascular space in the right basal ganglia. No intracranial hemorrhage, mass effect, or midline shift. No hydrocephalus. The basilar cisterns are patent. No evidence of territorial infarct or acute ischemia. No extra-axial or intracranial fluid collection. Vascular: No hyperdense vessel or unexpected calcification. Skull: No fracture or focal lesion. Sinuses/Orbits: Paranasal sinuses and mastoid air cells are clear. The visualized orbits are unremarkable. Other: Small right frontal scalp lipoma is unchanged. IMPRESSION: No acute intracranial abnormality. Electronically Signed   By: Jeb Levering M.D.   On: 08/04/2017 23:50   Mr Jodene Nam Head Wo Contrast  Result Date: 08/05/2017 CLINICAL DATA:  Right-sided facial numbness for 1 day. EXAM: MRI HEAD WITHOUT CONTRAST MRA HEAD WITHOUT CONTRAST TECHNIQUE: Multiplanar, multiecho pulse sequences of the brain and surrounding structures were obtained without intravenous contrast. Angiographic images of the head were obtained using MRA technique without contrast. COMPARISON:  Head CT 08/04/2017, CTA 04/08/2012, and MRI/MRA 04/02/2012 FINDINGS: MRI HEAD FINDINGS Brain: A metallic BB retained in the soft tissues of the right chin region results in extensive susceptibility artifact which obscures the right greater than left anterior frontal and temporal lobes on diffusion imaging. No acute infarct is identified within this limitation. There is no evidence of intracranial hemorrhage, mass, midline shift, or extra-axial fluid collection. The ventricles and sulci are normal. Small foci of T2 hyperintensity scattered throughout the subcortical and deep cerebral white matter bilaterally have progressed from 2013 and are nonspecific but compatible with mild chronic small vessel ischemic disease. There is a chronic lacunar infarct in the anterior  limb of the right internal capsule/anterior putamen. A partially empty sella is again noted. Vascular: Major intracranial vascular flow voids are preserved. Skull and upper cervical spine: Unremarkable bone marrow signal. Sinuses/Orbits: Unremarkable orbits. Paranasal sinuses and mastoid air cells are clear. Other: Mildly prominent but subcentimeter lymph nodes scattered throughout the neck bilaterally, nonspecific. Small parotid/ periparotid nodules also most likely represent lymph nodes. MRA HEAD FINDINGS The visualized distal vertebral arteries are widely patent to the basilar and codominant. Patent PICAs and SCAs are identified bilaterally. The basilar artery is widely patent. There is a patent right posterior communicating artery. PCAs are patent without evidence of significant stenosis. The internal carotid arteries are patent from skullbase to carotid termini with mild left siphon irregularity but no evidence of significant stenosis. ACAs and MCAs are patent without evidence of proximal branch occlusion or significant stenosis. No aneurysm is identified. IMPRESSION: 1. No acute intracranial abnormality. 2. Mild chronic small vessel ischemic disease, progressed from 2013. 3. Negative head MRA. Electronically Signed   By: Seymour Bars.D.  On: 08/05/2017 18:32   Mr Brain Wo Contrast  Result Date: 08/05/2017 CLINICAL DATA:  Right-sided facial numbness for 1 day. EXAM: MRI HEAD WITHOUT CONTRAST MRA HEAD WITHOUT CONTRAST TECHNIQUE: Multiplanar, multiecho pulse sequences of the brain and surrounding structures were obtained without intravenous contrast. Angiographic images of the head were obtained using MRA technique without contrast. COMPARISON:  Head CT 08/04/2017, CTA 04/08/2012, and MRI/MRA 04/02/2012 FINDINGS: MRI HEAD FINDINGS Brain: A metallic BB retained in the soft tissues of the right chin region results in extensive susceptibility artifact which obscures the right greater than left anterior frontal  and temporal lobes on diffusion imaging. No acute infarct is identified within this limitation. There is no evidence of intracranial hemorrhage, mass, midline shift, or extra-axial fluid collection. The ventricles and sulci are normal. Small foci of T2 hyperintensity scattered throughout the subcortical and deep cerebral white matter bilaterally have progressed from 2013 and are nonspecific but compatible with mild chronic small vessel ischemic disease. There is a chronic lacunar infarct in the anterior limb of the right internal capsule/anterior putamen. A partially empty sella is again noted. Vascular: Major intracranial vascular flow voids are preserved. Skull and upper cervical spine: Unremarkable bone marrow signal. Sinuses/Orbits: Unremarkable orbits. Paranasal sinuses and mastoid air cells are clear. Other: Mildly prominent but subcentimeter lymph nodes scattered throughout the neck bilaterally, nonspecific. Small parotid/ periparotid nodules also most likely represent lymph nodes. MRA HEAD FINDINGS The visualized distal vertebral arteries are widely patent to the basilar and codominant. Patent PICAs and SCAs are identified bilaterally. The basilar artery is widely patent. There is a patent right posterior communicating artery. PCAs are patent without evidence of significant stenosis. The internal carotid arteries are patent from skullbase to carotid termini with mild left siphon irregularity but no evidence of significant stenosis. ACAs and MCAs are patent without evidence of proximal branch occlusion or significant stenosis. No aneurysm is identified. IMPRESSION: 1. No acute intracranial abnormality. 2. Mild chronic small vessel ischemic disease, progressed from 2013. 3. Negative head MRA. Electronically Signed   By: Logan Bores M.D.   On: 08/05/2017 18:32    Scheduled Meds: .  stroke: mapping our early stages of recovery book   Does not apply Once  . apixaban  5 mg Oral BID  . atorvastatin  10 mg  Oral q1800  . escitalopram  10 mg Oral Daily  . montelukast  10 mg Oral QHS  . pantoprazole  40 mg Oral Daily   Continuous Infusions:  Active Problems:   Asthma in adult   Hyperlipidemia   DVT, lower extremity (HCC)   HTN (hypertension)   TIA (transient ischemic attack)   Stroke-like symptoms    Time spent: >35 minutes     Kinnie Feil  [facility] Pager [phone]. If 7PM-7AM, please contact night-coverage at [email or web address], password [facility] 08/06/2017, 8:23 AM  LOS: 1 day

## 2017-08-07 NOTE — Progress Notes (Signed)
Pt being discharged home via wheelchair with family. Pt alert and oriented x4. VSS. Pt c/o no pain at this time. No signs of respiratory distress. Education complete and care plans resolved. IV removed with catheter intact and pt tolerated well. No further issues at this time. Pt to follow up with PCP. Nancee Brownrigg R, RN 

## 2017-08-07 NOTE — Care Management Note (Signed)
Case Management Note  Patient Details  Name: Anita Benton MRN: [id number] Date of Birth: 1955-08-29  Subjective/Objective:   Pt admitted with TIA. She is from home with spouse.               Action/Plan: PT recommending outpatient therapy. MD notified but does not feels she needs outpatient therapy. Pt has PCP, insurance and transportation home. No further needs per CM.   Expected Discharge Date:  08/07/17               Expected Discharge Plan:  Home/Self Care  In-House Referral:     Discharge planning Services     Post Acute Care Choice:    Choice offered to:     DME Arranged:    DME Agency:     HH Arranged:    HH Agency:     Status of Service:  Completed, signed off  If discussed at [employer] of Stay Meetings, dates discussed:    Additional Comments:  Pollie Friar, RN 08/07/2017, 10:14 AM

## 2017-09-03 ENCOUNTER — Other Ambulatory Visit: Payer: Self-pay | Admitting: Internal Medicine

## 2017-09-03 DIAGNOSIS — R3129 Other microscopic hematuria: Secondary | ICD-10-CM

## 2017-09-03 DIAGNOSIS — R634 Abnormal weight loss: Secondary | ICD-10-CM

## 2017-09-10 ENCOUNTER — Other Ambulatory Visit: Payer: Self-pay | Admitting: Internal Medicine

## 2017-09-10 DIAGNOSIS — R634 Abnormal weight loss: Secondary | ICD-10-CM

## 2017-09-10 DIAGNOSIS — R1110 Vomiting, unspecified: Secondary | ICD-10-CM

## 2017-09-11 ENCOUNTER — Inpatient Hospital Stay
Admission: RE | Admit: 2017-09-11 | Discharge: 2017-09-11 | Disposition: A | Discharge status: Home / Self Care | Payer: Self-pay | Source: Ambulatory Visit | Attending: Internal Medicine | Admitting: Internal Medicine

## 2017-09-12 ENCOUNTER — Other Ambulatory Visit: Payer: Self-pay | Admitting: Internal Medicine

## 2017-09-12 ENCOUNTER — Ambulatory Visit
Admission: RE | Admit: 2017-09-12 | Discharge: 2017-09-12 | Disposition: A | Discharge status: Home / Self Care | Payer: BLUE CROSS/BLUE SHIELD | Source: Ambulatory Visit | Attending: Internal Medicine | Admitting: Internal Medicine

## 2017-09-12 DIAGNOSIS — R059 Cough, unspecified: Secondary | ICD-10-CM

## 2017-09-12 DIAGNOSIS — R05 Cough: Secondary | ICD-10-CM

## 2017-09-24 ENCOUNTER — Ambulatory Visit: Payer: BLUE CROSS/BLUE SHIELD | Admitting: Neurology

## 2017-09-24 ENCOUNTER — Encounter: Payer: Self-pay | Admitting: Neurology

## 2017-09-24 VITALS — BP 125/79 | HR 77 | Ht 64.0 in | Wt 202.6 lb

## 2017-09-24 DIAGNOSIS — R519 Headache, unspecified: Secondary | ICD-10-CM

## 2017-09-24 DIAGNOSIS — R51 Headache: Secondary | ICD-10-CM | POA: Diagnosis not present

## 2017-09-24 MED ORDER — TOPIRAMATE 50 MG PO TABS: 50.0000 mg | ORAL_TABLET | Freq: Two times a day (BID) | ORAL | 3 refills | Status: DC

## 2017-09-24 MED ORDER — TIZANIDINE HCL 2 MG PO CAPS: 2.0000 mg | ORAL_CAPSULE | Freq: Every day | ORAL | 3 refills | Status: DC

## 2017-09-24 NOTE — Patient Instructions (Addendum)
I had a long discussion with the patient regarding her episode of possible, get a migraine now followed by daily headaches which appear to be mixed vascular headaches with muscle tension features. I recommend trial of Zanaflex 2 mg at night and do regular neck stretching exercises. Also tried local heat application in the neck. Trial of Topamax 50 mg twice daily to help with her daily headaches. I discussed possible side effects with the patient and advised her to call me if needed. She was also advised to increase participation in daily activities for stress relaxation-like regular exercise, walking, swimming, medication and yoga She will return for follow-up in 2 months or call earlier if necessary.   Neck Exercises Neck exercises can be important for many reasons:  They can help you to improve and maintain flexibility in your neck. This can be especially important as you age.  They can help to make your neck stronger. This can make movement easier.  They can reduce or prevent neck pain.  They may help your upper back.  Ask your health care provider which neck exercises would be best for you. Exercises Neck Press Repeat this exercise 10 times. Do it first thing in the morning and right before bed or as told by your health care provider. 1. Lie on your back on a firm bed or on the floor with a pillow under your head. 2. Use your neck muscles to push your head down on the pillow and straighten your spine. 3. Hold the position as well as you can. Keep your head facing up and your chin tucked. 4. Slowly count to 5 while holding this position. 5. Relax for a few seconds. Then repeat.  Isometric Strengthening Do a full set of these exercises 2 times a day or as told by your health care provider. 1. Sit in a supportive chair and place your hand on your forehead. 2. Push forward with your head and neck while pushing back with your hand. Hold for 10 seconds. 3. Relax. Then repeat the exercise 3  times. 4. Next, do thesequence again, this time putting your hand against the back of your head. Use your head and neck to push backward against the hand pressure. 5. Finally, do the same exercise on either side of your head, pushing sideways against the pressure of your hand.  Prone Head Lifts Repeat this exercise 5 times. Do this 2 times a day or as told by your health care provider. 1. Lie face-down, resting on your elbows so that your chest and upper back are raised. 2. Start with your head facing downward, near your chest. Position your chin either on or near your chest. 3. Slowly lift your head upward. Lift until you are looking straight ahead. Then continue lifting your head as far back as you can stretch. 4. Hold your head up for 5 seconds. Then slowly lower it to your starting position.  Supine Head Lifts Repeat this exercise 8-10 times. Do this 2 times a day or as told by your health care provider. 1. Lie on your back, bending your knees to point to the ceiling and keeping your feet flat on the floor. 2. Lift your head slowly off the floor, raising your chin toward your chest. 3. Hold for 5 seconds. 4. Relax and repeat.  Scapular Retraction Repeat this exercise 5 times. Do this 2 times a day or as told by your health care provider. 1. Stand with your arms at your sides. Look straight ahead.  2. Slowly pull both shoulders backward and downward until you feel a stretch between your shoulder blades in your upper back. 3. Hold for 10-30 seconds. 4. Relax and repeat.  Contact a health care provider if:  Your neck pain or discomfort gets much worse when you do an exercise.  Your neck pain or discomfort does not improve within 2 hours after you exercise. If you have any of these problems, stop exercising right away. Do not do the exercises again unless your health care provider says that you can. Get help right away if:  You develop sudden, severe neck pain. If this happens, stop  exercising right away. Do not do the exercises again unless your health care provider says that you can. Exercises Neck Stretch  Repeat this exercise 3-5 times. 1. Do this exercise while standing or while sitting in a chair. 2. Place your feet flat on the floor, shoulder-width apart. 3. Slowly turn your head to the right. Turn it all the way to the right so you can look over your right shoulder. Do not tilt or tip your head. 4. Hold this position for 10-30 seconds. 5. Slowly turn your head to the left, to look over your left shoulder. 6. Hold this position for 10-30 seconds.  Neck Retraction Repeat this exercise 8-10 times. Do this 3-4 times a day or as told by your health care provider. 1. Do this exercise while standing or while sitting in a sturdy chair. 2. Look straight ahead. Do not bend your neck. 3. Use your fingers to push your chin backward. Do not bend your neck for this movement. Continue to face straight ahead. If you are doing the exercise properly, you will feel a slight sensation in your throat and a stretch at the back of your neck. 4. Hold the stretch for 1-2 seconds. Relax and repeat.  This information is not intended to replace advice given to you by your health care provider. Make sure you discuss any questions you have with your health care provider. Document Released: 06/28/2015 Document Revised: 12/23/2015 Document Reviewed: 01/25/2015 Elsevier Interactive Patient Education  [employer].

## 2017-09-24 NOTE — Progress Notes (Signed)
[facility] [address] [place]. [place] [address] [phone]       OFFICE FOLLOW-UP NOTE  Anita Benton Date of Birth:  08/10/1955 Medical Record Number:  [id number]   HPI: Anita Benton is a 62 year old lady seen today for the first office follow-up visit following hospital admission for TIA versus computed migraine in January 2019. History is obtained from the patient and review of electronic medical records. I have personally reviewed imaging films.Anita Benton is an 62 y.o. female  PMH of DVT- R leg 05/2017 on Eliquis, Hx of CVA x 2, HLD, Asthma, Obesity, recent history of Shingles presents to outside hospital on 08/04/2017 with complaints of left sided numbness. Dr Cheral Marker was contacted by phone and agreed to have the patient transferred to [facility] for stroke evaluation. Initial Head CT negative for acute findings.Her initial symptoms at [facility] were Left facial numbness, slow speech and Left hand weakness. On exam this evening at [facility] patient is complaining of Left sided numbness on her entire body, worse on her face. She is complaining of a Left sided H/A 8/10 with photophobia, blurred vision or nausea. She has not asked for pain medication. She is complaining of Left sided hand weakness. No dysarthria or slow speech noted on exam. She is also complaining of being "itchy" all over her body. No active shingles lesions noted on face or body. Date last known well: Date: 08/04/2017 Time last known well: Time: 21:30 tPA Given: No: outside the window.Modified Rankin: Rankin Score=0. MRI scan of the brain showed no acute abnormality. There was small vessel disease changes which were progressed compared to previous MRI from 2013. MRI of the brain showed no significant intracranial stenosis. Carotid ultrasound and transthoracic echo were unremarkable. Patient was already on eliquis due to recent deep vein thrombosis which was continued. Patient  states she's done well since discharge has not had any recurrent TIA or stroke symptoms however she's been having frequent headaches. These headaches began with this episode and have not resolved over the last month and a half She in fact describes now headache occurring on a daily basis and continuous. The headache is mostly on the left side she describes this as a constant 10/10 headache which is mostly aching in character. Headache appears to be diminished in sleep. She is able to work moredue to that despite the headache. The headache is accompanied by nausea and occasional vomiting light sensitivity. She does feel better if she lies down and goes to sleep. She's tried taking Tylenol and Excedrin without relief in her primary care physician as recently prescribed to a muscle relaxant #1 it tone twice daily which is also not helping. She does complain of neck pain and spasm and tightness and has trouble moving the neck in either direction. She does have remote history of cluster headaches   but denies migraine headache history    ROS:   14 system review of systems is positive for  fatigue, runny nose, eye pain, cough, food allergies, easy bruising and bleeding, memory loss, joint pain, restless legs, neck pain and all other systems negative  PMH:  Past Medical History:  Diagnosis Date  . Allergy   . Arthritis    "ankles; right hip"  . GERD (gastroesophageal reflux disease)   . Hyperlipidemia   . Itching   . Keloid    "q where; I have keloid skin"  . Menopause   . Migraines 04/01/2012  . Right leg DVT ([place]) 1980's  .  Shingles 04/01/2012   "have them q year; usually in Nov; for the last 20 years"  . Shortness of breath 04/01/2012   "when my asthma acts up"  . Stroke [facility]) 1977; 04/01/2012   denies residual; "from BCP RX"; little weak right arm; right face little numb"  . Vitamin D deficiency     Social History:  Social History   Socioeconomic History  . Marital status: Divorced    Spouse  name: Not on file  . Number of children: Not on file  . Years of education: Not on file  . Highest education level: Not on file  Social Needs  . Financial resource strain: Not on file  . Food insecurity - worry: Not on file  . Food insecurity - inability: Not on file  . Transportation needs - medical: Not on file  . Transportation needs - non-medical: Not on file  Occupational History  . Not on file  Tobacco Use  . Smoking status: Never Smoker  . Smokeless tobacco: Never Used  Substance and Sexual Activity  . Alcohol use: No  . Drug use: No  . Sexual activity: Not on file  Other Topics Concern  . Not on file  Social History Narrative  . Not on file    Medications:   Current Outpatient Medications on File Prior to Visit  Medication Sig Dispense Refill  . albuterol (ACCUNEB) 1.25 MG/3ML nebulizer solution Take 1 ampule by nebulization every 6 (six) hours as needed for wheezing.    Marland Kitchen atorvastatin (LIPITOR) 10 MG tablet Take 1 tablet (10 mg total) by mouth daily. 90 tablet 1  . Cholecalciferol (VITAMIN D-1000 MAX ST) 1000 units tablet Take 1,000 Units by mouth daily.    . cyclobenzaprine (FLEXERIL) 5 MG tablet Take 5 mg by mouth as needed (Knee Pain).   0  . ELIQUIS 5 MG TABS tablet Take 5 mg by mouth 2 (two) times daily.  2  . escitalopram (LEXAPRO) 10 MG tablet Take 1 tablet (10 mg total) by mouth daily. 90 tablet 1  . famotidine (PEPCID) 20 MG tablet TAKE 1 TABLET BY MOUTH AT BEDTIME (Patient taking differently: TAKE 1 TABLET BY MOUTH AS NEEDED FOR GERD) 90 tablet 0  . montelukast (SINGULAIR) 10 MG tablet Take 1 tablet (10 mg total) by mouth at bedtime. 90 tablet 1  . MULTIPLE VITAMIN PO Take 1 tablet by mouth.    . pantoprazole (PROTONIX) 40 MG tablet TAKE 1 TABLET (40 MG TOTAL) BY MOUTH DAILY. TAKE 30-60 MIN BEFORE FIRST MEAL OF THE DAY 90 tablet 0   No current facility-administered medications on file prior to visit.     Allergies:   Allergies  Allergen Reactions  .  Codeine Rash  . Levofloxacin Itching and Swelling    Lip swelling  . Rice Shortness Of Breath and Rash    Physical Exam General: well developed, well nourished middle-aged lady, seated, in no evident distress Head: head normocephalic and atraumatic.  Neck: supple with no carotid or supraclavicular bruits Cardiovascular: regular rate and rhythm, no murmurs Musculoskeletal: no deformity 5 spasm of posterior neck muscles with restriction of movements Skin:  no rash/petichiae Vascular:  Normal pulses all extremities Vitals:   09/24/17 1005  BP: 125/79  Pulse: 77   Neurologic Exam Mental Status: Awake and fully alert. Oriented to place and time. Recent and remote memory intact. Attention span, concentration and fund of knowledge appropriate. Mood and affect appropriate.  Cranial Nerves: Fundoscopic exam reveals sharp disc margins. Pupils  equal, briskly reactive to light. Extraocular movements full without nystagmus. Visual fields full to confrontation. Hearing intact. Facial sensation intact. Face, tongue, palate moves normally and symmetrically.  Motor: Normal bulk and tone. Normal strength in all tested extremity muscles. Sensory.: intact to touch ,pinprick .position and vibratory sensation.  Coordination: Rapid alternating movements normal in all extremities. Finger-to-nose and heel-to-shin performed accurately bilaterally. Gait and Station: Arises from chair without difficulty. Stance is normal. Gait demonstrates normal stride length and balance . Able to heel, toe and tandem walk without difficulty.  Reflexes: 1+ and symmetric. Toes downgoing.       ASSESSMENT: 62 year old lady with episode of complicated migraine in January 2019 followed now by chronic daily   headaches with mixed migraine and tension headache features. Vascular risk factors of hyperlipidemia only. Recent lower extremity deep vein thrombosis for which she is on eliquis.    PLAN: I had a long discussion with the  patient regarding her episode of possible, get a migraine now followed by daily headaches which appear to be mixed vascular headaches with muscle tension features. I recommend trial of Zanaflex 2 mg at night and do regular neck stretching exercises. Also tried local heat application in the neck. Trial of Topamax 50 mg twice daily to help with her daily headaches. I discussed possible side effects with the patient and advised her to call me if needed. She was also advised to increase participation in daily activities for stress relaxation-like regular exercise, walking, swimming, medication and yoga She will return for follow-up in 2 months or call earlier if necessary. Greater than 50% of time during this 25 minute visit was spent on counseling,explanation of diagnosis mixed migraine and tension headache, planning of further management, discussion with patient and family and coordination of care Antony Contras, MD  [facility] [address] [place], [place] [address]  Phone [phone] Fax [phone] Note: This document was prepared with digital dictation and possible smart phrase technology. Any transcriptional errors that result from this process are unintentional

## 2017-09-26 ENCOUNTER — Ambulatory Visit
Admission: RE | Admit: 2017-09-26 | Discharge: 2017-09-26 | Disposition: A | Discharge status: Home / Self Care | Payer: BLUE CROSS/BLUE SHIELD | Source: Ambulatory Visit | Attending: Internal Medicine | Admitting: Internal Medicine

## 2017-09-26 DIAGNOSIS — R634 Abnormal weight loss: Secondary | ICD-10-CM

## 2017-09-26 DIAGNOSIS — R1110 Vomiting, unspecified: Secondary | ICD-10-CM

## 2017-10-05 ENCOUNTER — Telehealth: Payer: Self-pay | Admitting: Hematology and Oncology

## 2017-10-05 NOTE — Telephone Encounter (Signed)
Left message for patient regarding upcoming march appointments per 3/7 sch message.

## 2017-10-11 ENCOUNTER — Encounter: Payer: Self-pay | Admitting: *Deleted

## 2017-10-11 ENCOUNTER — Inpatient Hospital Stay: Payer: BLUE CROSS/BLUE SHIELD

## 2017-10-11 ENCOUNTER — Inpatient Hospital Stay: Payer: BLUE CROSS/BLUE SHIELD | Attending: Hematology and Oncology | Admitting: Hematology and Oncology

## 2017-10-11 ENCOUNTER — Other Ambulatory Visit: Payer: Self-pay

## 2017-10-11 ENCOUNTER — Telehealth: Payer: Self-pay

## 2017-10-11 DIAGNOSIS — Z7901 Long term (current) use of anticoagulants: Secondary | ICD-10-CM | POA: Diagnosis not present

## 2017-10-11 DIAGNOSIS — R599 Enlarged lymph nodes, unspecified: Secondary | ICD-10-CM | POA: Diagnosis present

## 2017-10-11 DIAGNOSIS — I82491 Acute embolism and thrombosis of other specified deep vein of right lower extremity: Secondary | ICD-10-CM | POA: Diagnosis not present

## 2017-10-11 DIAGNOSIS — R590 Localized enlarged lymph nodes: Secondary | ICD-10-CM | POA: Insufficient documentation

## 2017-10-11 DIAGNOSIS — L049 Acute lymphadenitis, unspecified: Secondary | ICD-10-CM

## 2017-10-11 DIAGNOSIS — R634 Abnormal weight loss: Secondary | ICD-10-CM | POA: Diagnosis not present

## 2017-10-11 DIAGNOSIS — I82591 Chronic embolism and thrombosis of other specified deep vein of right lower extremity: Secondary | ICD-10-CM

## 2017-10-11 DIAGNOSIS — Z79899 Other long term (current) drug therapy: Secondary | ICD-10-CM | POA: Diagnosis not present

## 2017-10-11 DIAGNOSIS — Z86718 Personal history of other venous thrombosis and embolism: Secondary | ICD-10-CM | POA: Diagnosis not present

## 2017-10-11 LAB — CBC WITH DIFFERENTIAL (CANCER CENTER ONLY)
BASOS ABS: 0 10*3/uL (ref 0.0–0.1)
BASOS PCT: 1 %
EOS ABS: 0 10*3/uL (ref 0.0–0.5)
EOS PCT: 1 %
HCT: 41.1 % (ref 34.8–46.6)
Hemoglobin: 13.8 g/dL (ref 11.6–15.9)
Lymphocytes Relative: 33 %
Lymphs Abs: 2.1 10*3/uL (ref 0.9–3.3)
MCH: 29.4 pg (ref 25.1–34.0)
MCHC: 33.7 g/dL (ref 31.5–36.0)
MCV: 87.3 fL (ref 79.5–101.0)
Monocytes Absolute: 0.4 10*3/uL (ref 0.1–0.9)
Monocytes Relative: 7 %
Neutro Abs: 3.8 10*3/uL (ref 1.5–6.5)
Neutrophils Relative %: 58 %
PLATELETS: 312 10*3/uL (ref 145–400)
RBC: 4.71 MIL/uL (ref 3.70–5.45)
RDW: 15.6 % — ABNORMAL HIGH (ref 11.2–14.5)
WBC: 6.4 10*3/uL (ref 3.9–10.3)

## 2017-10-11 LAB — CMP (CANCER CENTER ONLY)
ALT: 13 U/L (ref 0–55)
AST: 14 U/L (ref 5–34)
Albumin: 3.3 g/dL — ABNORMAL LOW (ref 3.5–5.0)
Alkaline Phosphatase: 96 U/L (ref 40–150)
Anion gap: 8 (ref 3–11)
BILIRUBIN TOTAL: 0.4 mg/dL (ref 0.2–1.2)
BUN: 16 mg/dL (ref 7–26)
CO2: 22 mmol/L (ref 22–29)
CREATININE: 0.76 mg/dL (ref 0.60–1.10)
Calcium: 9.5 mg/dL (ref 8.4–10.4)
Chloride: 112 mmol/L — ABNORMAL HIGH (ref 98–109)
GFR, Estimated: >60 mL/min (ref >60)
Glucose, Bld: 72 mg/dL (ref 70–140)
Potassium: 3.3 mmol/L — ABNORMAL LOW (ref 3.5–5.1)
Sodium: 142 mmol/L (ref 136–145)
TOTAL PROTEIN: 7.4 g/dL (ref 6.4–8.3)

## 2017-10-11 LAB — LACTATE DEHYDROGENASE: LDH: 212 U/L (ref 125–245)

## 2017-10-11 NOTE — Assessment & Plan Note (Signed)
First blood clots in both lower extremities 1980: Patient was apparently not treated with anticoagulation. This was right after she had vein stripping surgery  Recent acute DVT after knee surgery: Currently on Eliquis It appears that both these blood clotting incidents were after surgical episodes. If she has no identifiable additional risk factors, then she needs to be treated with anticoagulation for 6 months.  Lab review: 1. Factor V Leiden mutation: No mutation 2. Prothrombin gene G20210A: Pending 3. Protein S deficiency: Normal 4. Protein C deficiency: Normal 5. Antithrombin deficiency: Normal 6.  Factor VIII level increased: 222% 7.  Lupus anticoagulant testing: Negative but did show evidence of specific factor inhibitors to common pathway like 10, 5, 2 or fibrinogen

## 2017-10-11 NOTE — Telephone Encounter (Signed)
Referral request received per Dr Lindi Adie for Dr Watt Climes.  Notified [facility] office "[facility]" set up pt's appt with Dr Watt Climes for November 13, 2017 at [facility] most recent office notes and CT abd/pelvis results to their office fax [phone].  Notified patient but she requests a call back at 3 pm so she can write down the info for their office and appt. Will call her back.

## 2017-10-11 NOTE — Progress Notes (Signed)
Patient Care Team: Schoenhoff, Altamese Cabal, MD as PCP - General (Internal Medicine)  DIAGNOSIS:  Encounter Diagnoses  Name Primary?  . Lymphadenopathy, abdominal   . Chronic deep vein thrombosis (DVT) of other vein of right lower extremity (HCC)     CHIEF COMPLIANT: Follow-up to evaluate the cause of abdominal lymphadenopathy, weight loss  INTERVAL HISTORY: Anita Benton is a 62 year old with above-mentioned history of chronic DVT for which she is on anticoagulation who presented with hematuria and underwent a CT of the abdomen and pelvis.  The CT scan revealed abdominal lymphadenopathy along with the infiltrative changes in the belly.  She does not have any abdominal symptoms.  Denies any abdominal pain nausea vomiting diarrhea or constipation.  She does not have any blood in the stool.  Because she has lost about 19 pounds in the last 3 months along with having some hot flashes and night sweats, she was referred to [place].  REVIEW OF SYSTEMS:   Constitutional: Denies fevers, chills or abnormal weight loss Eyes: Denies blurriness of vision Ears, nose, mouth, throat, and face: Denies mucositis or sore throat Respiratory: Denies cough, dyspnea or wheezes Cardiovascular: Denies palpitation, chest discomfort Gastrointestinal:  Denies nausea, heartburn or change in bowel habits Skin: Denies abnormal skin rashes Lymphatics: Denies new lymphadenopathy or easy bruising Neurological: Chronic headaches and previous episodes of strokelike symptoms Behavioral/Psych: Mood is stable, no new changes  Extremities: No lower extremity edema All other systems were reviewed with the patient and are negative.  I have reviewed the past medical history, past surgical history, social history and family history with the patient and they are unchanged from previous note.  ALLERGIES:  is allergic to codeine; levofloxacin; and rice.  MEDICATIONS:  Current Outpatient Medications  Medication Sig Dispense  Refill  . albuterol (ACCUNEB) 1.25 MG/3ML nebulizer solution Take 1 ampule by nebulization every 6 (six) hours as needed for wheezing.    Marland Kitchen atorvastatin (LIPITOR) 10 MG tablet Take 1 tablet (10 mg total) by mouth daily. 90 tablet 1  . Cholecalciferol (VITAMIN D-1000 MAX ST) 1000 units tablet Take 1,000 Units by mouth daily.    . cyclobenzaprine (FLEXERIL) 5 MG tablet Take 5 mg by mouth as needed (Knee Pain).   0  . ELIQUIS 5 MG TABS tablet Take 5 mg by mouth 2 (two) times daily.  2  . escitalopram (LEXAPRO) 10 MG tablet Take 1 tablet (10 mg total) by mouth daily. 90 tablet 1  . famotidine (PEPCID) 20 MG tablet TAKE 1 TABLET BY MOUTH AT BEDTIME (Patient taking differently: TAKE 1 TABLET BY MOUTH AS NEEDED FOR GERD) 90 tablet 0  . montelukast (SINGULAIR) 10 MG tablet Take 1 tablet (10 mg total) by mouth at bedtime. 90 tablet 1  . MULTIPLE VITAMIN PO Take 1 tablet by mouth.    . pantoprazole (PROTONIX) 40 MG tablet TAKE 1 TABLET (40 MG TOTAL) BY MOUTH DAILY. TAKE 30-60 MIN BEFORE FIRST MEAL OF THE DAY 90 tablet 0  . tizanidine (ZANAFLEX) 2 MG capsule Take 1 capsule (2 mg total) by mouth at bedtime. 30 capsule 3  . topiramate (TOPAMAX) 50 MG tablet Take 1 tablet (50 mg total) by mouth 2 (two) times daily. 60 tablet 3   No current facility-administered medications for this visit.     PHYSICAL EXAMINATION: ECOG PERFORMANCE STATUS: 1 - Symptomatic but completely ambulatory  Vitals:   10/11/17 1056  BP: 138/84  Pulse: 68  Resp: 18  Temp: (!) 97.5 F (36.4 C)  SpO2:  100%   Filed Weights   10/11/17 1056  Weight: 200 lb 6.4 oz (90.9 kg)    GENERAL:alert, no distress and comfortable SKIN: skin color, texture, turgor are normal, no rashes or significant lesions EYES: normal, Conjunctiva are pink and non-injected, sclera clear OROPHARYNX:no exudate, no erythema and lips, buccal mucosa, and tongue normal  NECK: supple, thyroid normal size, non-tender, without nodularity LYMPH:  no palpable  lymphadenopathy in the cervical, axillary or inguinal LUNGS: clear to auscultation and percussion with normal breathing effort HEART: regular rate & rhythm and no murmurs and no lower extremity edema ABDOMEN:abdomen soft, non-tender and normal bowel sounds MUSCULOSKELETAL:no cyanosis of digits and no clubbing  NEURO: alert & oriented x 3 with fluent speech, no focal motor/sensory deficits EXTREMITIES: No lower extremity edema  LABORATORY DATA:  I have reviewed the data as listed CMP Latest Ref Rng & Units 08/04/2017 09/16/2016 10/13/2013  Glucose 65 - 99 mg/dL 98 93 91  BUN 6 - 20 mg/dL 17 13 17   Creatinine 0.44 - 1.00 mg/dL 1.01(H) 0.82 0.70  Sodium 135 - 145 mmol/L 140 138 141  Potassium 3.5 - 5.1 mmol/L 3.8 3.9 3.8  Chloride 101 - 111 mmol/L 106 102 105  CO2 22 - 32 mmol/L 26 28 27   Calcium 8.9 - 10.3 mg/dL 9.2 8.6(L) 9.1  Total Protein 6.5 - 8.1 g/dL 7.2 7.5 6.9  Total Bilirubin 0.3 - 1.2 mg/dL 0.5 0.4 0.4  Alkaline Phos 38 - 126 U/L 93 75 64  AST 15 - 41 U/L 22 45(H) 16  ALT 14 - 54 U/L 21 26 12     Lab Results  Component Value Date   WBC 6.9 08/04/2017   HGB 13.1 08/04/2017   HCT 39.0 08/04/2017   MCV 88.4 08/04/2017   PLT 275 08/04/2017   NEUTROABS 4.3 08/04/2017    ASSESSMENT & PLAN:  Lymphadenopathy, abdominal CT abdomen and pelvis 09/27/2017 performed for microscopic hematuria and loss of weight revealed infiltrated upper mesentery surrounding SMA and SMV with lymphadenopathy.  These findings may represent either inflammation or other differentials included lymphoma, carcinoid, inflammatory pseudotumor or sclerosing mesenteritis.  Recommendation: Blood work including LDH Repeat CT chest abdomen pelvis in 3 months to evaluate if the lymph nodes have resolved I will request gastroenterology consultation.  If on 26-month follow-up CT scan she still has persistent lymphadenopathy then we will have to figure out a way to biopsy these lymph  nodes. --------------------------------------------------------------------------------------------------------------------------------------------------------------------------------- DVT, lower extremity ([place]) First blood clots in both lower extremities 1980: Patient was apparently not treated with anticoagulation. This was right after she had vein stripping surgery  Recent acute DVT after knee surgery: Currently on Eliquis It appears that both these blood clotting incidents were after surgical episodes. If she has no identifiable additional risk factors, then she needs to be treated with anticoagulation for 6 months.  Lab review: 1. Factor V Leiden mutation: No mutation 2. Prothrombin gene G20210A: Pending 3. Protein S deficiency: Normal 4. Protein C deficiency: Normal 5. Antithrombin deficiency: Normal 6.  Factor VIII level increased: 222% 7.  Lupus anticoagulant testing: Negative but did show evidence of specific factor inhibitors to common pathway like 10, 5, 2 or fibrinogen     I spent 25 minutes talking to the patient of which more than half was spent in counseling and coordination of care.  No orders of the defined types were placed in this encounter.  The patient has a good understanding of the overall plan. she agrees with it. she will  call with any problems that may develop before the next visit here.   Harriette Ohara, MD 10/11/17

## 2017-10-11 NOTE — Assessment & Plan Note (Signed)
CT abdomen and pelvis 09/27/2017 performed for microscopic hematuria and loss of weight revealed infiltrated upper mesentery surrounding SMA and SMV with lymphadenopathy.  These findings may represent either inflammation or other differentials included lymphoma, carcinoid, inflammatory pseudotumor or sclerosing mesenteritis.  Recommendation: Blood work including LDH and SPEP CT chest to evaluate if there is any additional lymphadenopathy. Discussed with radiology if biopsy can be obtained.  If the biopsy cannot be obtained then we will monitor it with a repeat CT scan in 2 months.

## 2017-11-05 ENCOUNTER — Other Ambulatory Visit: Payer: Self-pay | Admitting: Internal Medicine

## 2017-11-05 ENCOUNTER — Ambulatory Visit
Admission: RE | Admit: 2017-11-05 | Discharge: 2017-11-05 | Disposition: A | Discharge status: Home / Self Care | Payer: BLUE CROSS/BLUE SHIELD | Source: Ambulatory Visit | Attending: Internal Medicine | Admitting: Internal Medicine

## 2017-11-05 DIAGNOSIS — R059 Cough, unspecified: Secondary | ICD-10-CM

## 2017-11-05 DIAGNOSIS — R05 Cough: Secondary | ICD-10-CM

## 2017-11-21 ENCOUNTER — Other Ambulatory Visit (HOSPITAL_COMMUNITY): Payer: Self-pay | Admitting: Gastroenterology

## 2017-11-21 DIAGNOSIS — R634 Abnormal weight loss: Secondary | ICD-10-CM

## 2017-11-21 DIAGNOSIS — R933 Abnormal findings on diagnostic imaging of other parts of digestive tract: Secondary | ICD-10-CM

## 2017-11-27 ENCOUNTER — Ambulatory Visit (HOSPITAL_COMMUNITY)
Admission: RE | Admit: 2017-11-27 | Discharge: 2017-11-27 | Disposition: A | Discharge status: Home / Self Care | Payer: BLUE CROSS/BLUE SHIELD | Source: Ambulatory Visit | Attending: Gastroenterology | Admitting: Gastroenterology

## 2017-11-27 DIAGNOSIS — R634 Abnormal weight loss: Secondary | ICD-10-CM

## 2017-11-27 DIAGNOSIS — R933 Abnormal findings on diagnostic imaging of other parts of digestive tract: Secondary | ICD-10-CM | POA: Diagnosis not present

## 2017-11-27 LAB — GLUCOSE, CAPILLARY: GLUCOSE-CAPILLARY: 106 mg/dL — AB (ref 65–99)

## 2017-11-27 MED ORDER — FLUDEOXYGLUCOSE F - 18 (FDG) INJECTION
9.7000 | Freq: Once | INTRAVENOUS | Status: AC | PRN
  Administered 2017-11-27: 9.7 via INTRAVENOUS

## 2017-12-03 ENCOUNTER — Ambulatory Visit (HOSPITAL_COMMUNITY)
Admission: RE | Admit: 2017-12-03 | Discharge: 2017-12-03 | Disposition: A | Discharge status: Home / Self Care | Payer: BLUE CROSS/BLUE SHIELD | Source: Ambulatory Visit | Attending: Hematology and Oncology | Admitting: Hematology and Oncology

## 2017-12-03 ENCOUNTER — Encounter (HOSPITAL_COMMUNITY): Payer: BLUE CROSS/BLUE SHIELD

## 2017-12-03 DIAGNOSIS — I82491 Acute embolism and thrombosis of other specified deep vein of right lower extremity: Secondary | ICD-10-CM | POA: Diagnosis not present

## 2017-12-03 NOTE — Progress Notes (Signed)
Preliminary notes--Bilateral lower extremities venous duplex study completed. Positive for DVT involving right distal femoral vein segment.  Result called Dr. Geralyn Flash office notified RN May who is in charge, instructed that patient can go home continue with blood thinning medication due to chronic condition.   Hongying Lourene Hoston (RDMS RVT) 12/03/17 4:09 PM

## 2017-12-04 ENCOUNTER — Encounter: Payer: Self-pay | Admitting: Neurology

## 2017-12-04 ENCOUNTER — Ambulatory Visit: Payer: BLUE CROSS/BLUE SHIELD | Admitting: Neurology

## 2017-12-04 VITALS — BP 101/73 | HR 81 | Ht 64.0 in | Wt 198.2 lb

## 2017-12-04 DIAGNOSIS — M542 Cervicalgia: Secondary | ICD-10-CM

## 2017-12-04 MED ORDER — TIZANIDINE HCL 2 MG PO CAPS
2.0000 mg | ORAL_CAPSULE | Freq: Two times a day (BID) | ORAL | 3 refills | Status: DC
Start: 2017-12-04 — End: 2018-05-14

## 2017-12-04 NOTE — Patient Instructions (Signed)
I had a long discussion the patient with regards to her headaches as well as neck pain which appear to be improving. I recommend increasing Zanaflex for 2 mg twice daily to help with the neck pain as well as continue to do regular neck stretching exercises. Continue Topamax  In the current dose of 25 mg twice daily. She will return for follow-up in 3 months with Jesse,practitioner or call earlier if necessary  Neck Exercises Neck exercises can be important for many reasons:  They can help you to improve and maintain flexibility in your neck. This can be especially important as you age.  They can help to make your neck stronger. This can make movement easier.  They can reduce or prevent neck pain.  They may help your upper back.  Ask your health care provider which neck exercises would be best for you. Exercises Neck Press Repeat this exercise 10 times. Do it first thing in the morning and right before bed or as told by your health care provider. 1. Lie on your back on a firm bed or on the floor with a pillow under your head. 2. Use your neck muscles to push your head down on the pillow and straighten your spine. 3. Hold the position as well as you can. Keep your head facing up and your chin tucked. 4. Slowly count to 5 while holding this position. 5. Relax for a few seconds. Then repeat.  Isometric Strengthening Do a full set of these exercises 2 times a day or as told by your health care provider. 1. Sit in a supportive chair and place your hand on your forehead. 2. Push forward with your head and neck while pushing back with your hand. Hold for 10 seconds. 3. Relax. Then repeat the exercise 3 times. 4. Next, do thesequence again, this time putting your hand against the back of your head. Use your head and neck to push backward against the hand pressure. 5. Finally, do the same exercise on either side of your head, pushing sideways against the pressure of your hand.  Prone Head  Lifts Repeat this exercise 5 times. Do this 2 times a day or as told by your health care provider. 1. Lie face-down, resting on your elbows so that your chest and upper back are raised. 2. Start with your head facing downward, near your chest. Position your chin either on or near your chest. 3. Slowly lift your head upward. Lift until you are looking straight ahead. Then continue lifting your head as far back as you can stretch. 4. Hold your head up for 5 seconds. Then slowly lower it to your starting position.  Supine Head Lifts Repeat this exercise 8-10 times. Do this 2 times a day or as told by your health care provider. 1. Lie on your back, bending your knees to point to the ceiling and keeping your feet flat on the floor. 2. Lift your head slowly off the floor, raising your chin toward your chest. 3. Hold for 5 seconds. 4. Relax and repeat.  Scapular Retraction Repeat this exercise 5 times. Do this 2 times a day or as told by your health care provider. 1. Stand with your arms at your sides. Look straight ahead. 2. Slowly pull both shoulders backward and downward until you feel a stretch between your shoulder blades in your upper back. 3. Hold for 10-30 seconds. 4. Relax and repeat.  Contact a health care provider if:  Your neck pain or discomfort gets  much worse when you do an exercise.  Your neck pain or discomfort does not improve within 2 hours after you exercise. If you have any of these problems, stop exercising right away. Do not do the exercises again unless your health care provider says that you can. Get help right away if:  You develop sudden, severe neck pain. If this happens, stop exercising right away. Do not do the exercises again unless your health care provider says that you can. Exercises Neck Stretch  Repeat this exercise 3-5 times. 1. Do this exercise while standing or while sitting in a chair. 2. Place your feet flat on the floor, shoulder-width  apart. 3. Slowly turn your head to the right. Turn it all the way to the right so you can look over your right shoulder. Do not tilt or tip your head. 4. Hold this position for 10-30 seconds. 5. Slowly turn your head to the left, to look over your left shoulder. 6. Hold this position for 10-30 seconds.  Neck Retraction Repeat this exercise 8-10 times. Do this 3-4 times a day or as told by your health care provider. 1. Do this exercise while standing or while sitting in a sturdy chair. 2. Look straight ahead. Do not bend your neck. 3. Use your fingers to push your chin backward. Do not bend your neck for this movement. Continue to face straight ahead. If you are doing the exercise properly, you will feel a slight sensation in your throat and a stretch at the back of your neck. 4. Hold the stretch for 1-2 seconds. Relax and repeat.  This information is not intended to replace advice given to you by your health care provider. Make sure you discuss any questions you have with your health care provider. Document Released: 06/28/2015 Document Revised: 12/23/2015 Document Reviewed: 01/25/2015 Elsevier Interactive Patient Education  [employer].

## 2017-12-04 NOTE — Progress Notes (Signed)
[facility] [address] [place]. [place] [address] [phone]       OFFICE FOLLOW-UP NOTE  [name] Date of Birth:  Nov 14, 1955 Medical Record Number:  [id number]   HPI: initial visit 09/24/17 :Ms. Standard is a 62 year old lady seen today for the first office follow-up visit following hospital admission for TIA versus computed migraine in January 2019. History is obtained from the patient and review of electronic medical records. I have personally reviewed imaging films.Anita Benton is an 62 y.o. female  PMH of DVT- R leg 05/2017 on Eliquis, Hx of CVA x 2, HLD, Asthma, Obesity, recent history of Shingles presents to outside hospital on 08/04/2017 with complaints of left sided numbness. Dr Cheral Marker was contacted by phone and agreed to have the patient transferred to [facility] for stroke evaluation. Initial Head CT negative for acute findings.Her initial symptoms at [facility] were Left facial numbness, slow speech and Left hand weakness. On exam this evening at [facility] patient is complaining of Left sided numbness on her entire body, worse on her face. She is complaining of a Left sided H/A 8/10 with photophobia, blurred vision or nausea. She has not asked for pain medication. She is complaining of Left sided hand weakness. No dysarthria or slow speech noted on exam. She is also complaining of being "itchy" all over her body. No active shingles lesions noted on face or body. Date last known well: Date: 08/04/2017 Time last known well: Time: 21:30 tPA Given: No: outside the window.Modified Rankin: Rankin Score=0. MRI scan of the brain showed no acute abnormality. There was small vessel disease changes which were progressed compared to previous MRI from 2013. MRI of the brain showed no significant intracranial stenosis. Carotid ultrasound and transthoracic echo were unremarkable. Patient was already on eliquis due to recent deep vein thrombosis which  was continued. Patient states she's done well since discharge has not had any recurrent TIA or stroke symptoms however she's been having frequent headaches. These headaches began with this episode and have not resolved over the last month and a half She in fact describes now headache occurring on a daily basis and continuous. The headache is mostly on the left side she describes this as a constant 10/10 headache which is mostly aching in character. Headache appears to be diminished in sleep. She is able to work moredue to that despite the headache. The headache is accompanied by nausea and occasional vomiting light sensitivity. She does feel better if she lies down and goes to sleep. She's tried taking Tylenol and Excedrin without relief in her primary care physician as recently prescribed to a muscle relaxant #1 it tone twice daily which is also not helping. She does complain of neck pain and spasm and tightness and has trouble moving the neck in either direction. She does have remote history of cluster headaches   but denies migraine headache history  Update 12/04/2017 ; she returns for follow-up after last visit 2 months ago. She reports that her headaches are significantly better and in fact she cannot remember the last time she had one. She is tolerating Topamax 25 mg twice daily quite well without any side effects. She states she is still bothered by neck pain which he describes of the base of the neck on the left side. It is intermittent and sharp. She has not tried local heat application. She does take Zanaflex 2 mg at night which seems to help her sleep well. She has not tried  a daytime dose yet. She seems frustrated that despite 6 weeks of eliquis she still has persistent DVT in her leg and would need to take eliquis for longer period of time. She has no new complaints today.  ROS:   14 system review of systems is positive for  neck pain only and all other systems negative  PMH:  Past Medical  History:  Diagnosis Date  . Allergy   . Arthritis    "ankles; right hip"  . GERD (gastroesophageal reflux disease)   . Hyperlipidemia   . Itching   . Keloid    "q where; I have keloid skin"  . Menopause   . Migraines 04/01/2012  . Right leg DVT ([place]) 1980's  . Shingles 04/01/2012   "have them q year; usually in Nov; for the last 20 years"  . Shortness of breath 04/01/2012   "when my asthma acts up"  . Stroke [facility]) 1977; 04/01/2012   denies residual; "from BCP RX"; little weak right arm; right face little numb"  . Vitamin D deficiency     Social History:  Social History   Socioeconomic History  . Marital status: Divorced    Spouse name: Not on file  . Number of children: Not on file  . Years of education: Not on file  . Highest education level: Not on file  Occupational History  . Not on file  Social Needs  . Financial resource strain: Not on file  . Food insecurity:    Worry: Not on file    Inability: Not on file  . Transportation needs:    Medical: Not on file    Non-medical: Not on file  Tobacco Use  . Smoking status: Never Smoker  . Smokeless tobacco: Never Used  Substance and Sexual Activity  . Alcohol use: No  . Drug use: No  . Sexual activity: Not on file  Lifestyle  . Physical activity:    Days per week: Not on file    Minutes per session: Not on file  . Stress: Not on file  Relationships  . Social connections:    Talks on phone: Not on file    Gets together: Not on file    Attends religious service: Not on file    Active member of club or organization: Not on file    Attends meetings of clubs or organizations: Not on file    Relationship status: Not on file  . Intimate partner violence:    Fear of current or ex partner: Not on file    Emotionally abused: Not on file    Physically abused: Not on file    Forced sexual activity: Not on file  Other Topics Concern  . Not on file  Social History Narrative  . Not on file    Medications:   Current  Outpatient Medications on File Prior to Visit  Medication Sig Dispense Refill  . albuterol (ACCUNEB) 1.25 MG/3ML nebulizer solution Take 1 ampule by nebulization every 6 (six) hours as needed for wheezing.    Marland Kitchen atorvastatin (LIPITOR) 10 MG tablet Take 1 tablet (10 mg total) by mouth daily. 90 tablet 1  . Cholecalciferol (VITAMIN D-1000 MAX ST) 1000 units tablet Take 1,000 Units by mouth daily.    . cyclobenzaprine (FLEXERIL) 5 MG tablet Take 5 mg by mouth as needed (Knee Pain).   0  . ELIQUIS 5 MG TABS tablet Take 5 mg by mouth 2 (two) times daily.  2  . escitalopram (LEXAPRO) 10 MG  tablet Take 1 tablet (10 mg total) by mouth daily. 90 tablet 1  . famotidine (PEPCID) 20 MG tablet TAKE 1 TABLET BY MOUTH AT BEDTIME (Patient taking differently: TAKE 1 TABLET BY MOUTH AS NEEDED FOR GERD) 90 tablet 0  . hydrochlorothiazide (HYDRODIURIL) 25 MG tablet Take by mouth.    . montelukast (SINGULAIR) 10 MG tablet Take 1 tablet (10 mg total) by mouth at bedtime. 90 tablet 1  . MULTIPLE VITAMIN PO Take 1 tablet by mouth.    . nabumetone (RELAFEN) 500 MG tablet   0  . pantoprazole (PROTONIX) 40 MG tablet TAKE 1 TABLET (40 MG TOTAL) BY MOUTH DAILY. TAKE 30-60 MIN BEFORE FIRST MEAL OF THE DAY 90 tablet 0  . topiramate (TOPAMAX) 50 MG tablet Take 1 tablet (50 mg total) by mouth 2 (two) times daily. 60 tablet 3   No current facility-administered medications on file prior to visit.     Allergies:   Allergies  Allergen Reactions  . Codeine Rash  . Levofloxacin Itching and Swelling    Lip swelling  . Rice Shortness Of Breath and Rash    Physical Exam General: well developed, well nourished middle-aged lady, seated, in no evident distress Head: head normocephalic and atraumatic.  Neck: supple with no carotid or supraclavicular bruits Cardiovascular: regular rate and rhythm, no murmurs Musculoskeletal: no deformity 5 spasm of posterior neck muscles with restriction of movements Skin:  no  rash/petichiae Vascular:  Normal pulses all extremities Vitals:   12/04/17 1544  BP: 101/73  Pulse: 81   Neurologic Exam Mental Status: Awake and fully alert. Oriented to place and time. Recent and remote memory intact. Attention span, concentration and fund of knowledge appropriate. Mood and affect appropriate.  Cranial Nerves: Fundoscopic exam reveals sharp disc margins. Pupils equal, briskly reactive to light. Extraocular movements full without nystagmus. Visual fields full to confrontation. Hearing intact. Facial sensation intact. Face, tongue, palate moves normally and symmetrically.  Motor: Normal bulk and tone. Normal strength in all tested extremity muscles. Sensory.: intact to touch ,pinprick .position and vibratory sensation.  Coordination: Rapid alternating movements normal in all extremities. Finger-to-nose and heel-to-shin performed accurately bilaterally. Gait and Station: Arises from chair without difficulty. Stance is normal. Gait demonstrates normal stride length and balance . Able to heel, toe and tandem walk without difficulty.  Reflexes: 1+ and symmetric. Toes downgoing.       ASSESSMENT: 62 year old lady with episode of complicated migraine in January 2019 followed now by chronic daily   headaches with mixed migraine and tension headache features now much improved. Vascular risk factors of hyperlipidemia only. Recent lower extremity deep vein thrombosis for which she is on eliquis.    PLAN: I  had a long discussion the patient with regards to her headaches as well as neck pain which appear to be improving. I recommend increasing Zanaflex for 2 mg twice daily to help with the neck pain as well as continue to do regular neck stretching exercises. Continue Topamax  In the current dose of 25 mg twice daily. She will return for follow-up in 3 months with Jesse,practitioner or call earlier if necessary.Greater than 50% of time during this 25 minute visit was spent on  counseling,explanation of diagnosis mixed migraine and tension headache, planning of further management, discussion with patient and family and coordination of care Antony Contras, MD  [facility] [address] [place], [place] [address]  Phone [phone] Fax [phone] Note: This document was prepared with digital dictation and  possible smart Company secretary. Any transcriptional errors that result from this process are unintentional

## 2017-12-06 ENCOUNTER — Emergency Department (HOSPITAL_COMMUNITY)
Admission: EM | Admit: 2017-12-06 | Discharge: 2017-12-06 | Disposition: A | Discharge status: Home / Self Care | Payer: BLUE CROSS/BLUE SHIELD | Attending: Emergency Medicine | Admitting: Emergency Medicine

## 2017-12-06 ENCOUNTER — Encounter (HOSPITAL_COMMUNITY): Payer: Self-pay | Admitting: Emergency Medicine

## 2017-12-06 ENCOUNTER — Ambulatory Visit (HOSPITAL_COMMUNITY): Admit: 2017-12-06 | Payer: BLUE CROSS/BLUE SHIELD

## 2017-12-06 ENCOUNTER — Telehealth: Payer: Self-pay

## 2017-12-06 DIAGNOSIS — Z7901 Long term (current) use of anticoagulants: Secondary | ICD-10-CM | POA: Insufficient documentation

## 2017-12-06 DIAGNOSIS — Z96641 Presence of right artificial hip joint: Secondary | ICD-10-CM | POA: Insufficient documentation

## 2017-12-06 DIAGNOSIS — I10 Essential (primary) hypertension: Secondary | ICD-10-CM | POA: Diagnosis not present

## 2017-12-06 DIAGNOSIS — M79661 Pain in right lower leg: Secondary | ICD-10-CM | POA: Diagnosis present

## 2017-12-06 DIAGNOSIS — J45909 Unspecified asthma, uncomplicated: Secondary | ICD-10-CM | POA: Insufficient documentation

## 2017-12-06 DIAGNOSIS — Z79899 Other long term (current) drug therapy: Secondary | ICD-10-CM | POA: Diagnosis not present

## 2017-12-06 DIAGNOSIS — I82511 Chronic embolism and thrombosis of right femoral vein: Secondary | ICD-10-CM | POA: Insufficient documentation

## 2017-12-06 MED ORDER — TRAMADOL HCL 50 MG PO TABS: 50.0000 mg | ORAL_TABLET | Freq: Four times a day (QID) | ORAL | 0 refills | Status: DC | PRN

## 2017-12-06 MED ORDER — TRAMADOL HCL 50 MG PO TABS
100.0000 mg | ORAL_TABLET | Freq: Once | ORAL | Status: AC
  Administered 2017-12-06: 100 mg via ORAL
  Filled 2017-12-06: qty 2

## 2017-12-06 NOTE — ED Notes (Signed)
Pt unable to sign as no computer but verbalizes understanding of instructions. Home stable with husband via wc.

## 2017-12-06 NOTE — Telephone Encounter (Addendum)
Returned pt's call regarding her right leg has a known blood clot, reports as painful, unbearable, can barely walk, and swollen.  Pt reports she is at [facility] ER right now having leg checked. Pt asked if Dr Lindi Adie will be seeing her sooner then June.  Told her I will check back with her later in afternoon after she is seen there. Per Dr Lindi Adie, he would like to hear what they say at ER but that we can schedule her for appt with him next week.   From ER notes, pt was released to home with husband.  Sending a schedule message for pt to have appt with Dr Lindi Adie next week per him and to call pt with date/time.

## 2017-12-06 NOTE — ED Notes (Signed)
Pt statsa much improved after medication

## 2017-12-06 NOTE — Discharge Instructions (Signed)
You have been evaluated for your right leg pain.  This is likely due to your DVT.  Please take tramadol as needed for pain.  Keep leg elevated at rest.  Follow-up with your doctor for further management.

## 2017-12-06 NOTE — ED Notes (Signed)
Pt c/o continued pain, swelling and warmth at right leg after positive for DVT on Monday [place]. Denies chest pain or shob.

## 2017-12-06 NOTE — ED Provider Notes (Signed)
[facility] EMERGENCY DEPARTMENT Provider Note   CSN: [id number] Arrival date & time: 12/06/17  4098     History   Chief Complaint No chief complaint on file.   HPI Anita Benton is a 62 y.o. female.  HPI   62 year old female with history of arthritis, DVT, hypertension, chronic hip and back pain presenting complaining of right leg pain.  Patient has had history of DVT involving her right lower extremity since at least last year.  She is currently on Eliquis.  She reported increasing pain and swelling to her right lower leg for the past week.  She had an ultrasound of her right lower extremity performed 4 days ago that shows evidence of positive DVT involving the right distal femoral vein segment.  Her hematologist, Dr. Lindi Adie was consulted at that time and patient was instructed to go home to continue with her blood thinner medication due to to chronic condition.  She does have an appointment next month on the 22nd for a follow-up.  She report persistent pain involving her right lower extremity worse with prolonged standing.  Pain is 10 out of 10, described as a throbbing pressure sensation present at rest worsening with walking.  She denies any associated fever, chills, lightheadedness, dizziness, chest pain, trouble breathing, productive cough, hemoptysis or back pain.  She denies any numbness or weakness.  Patient she has been compliant with her Eliquis.  She does not take any other pain medication.  No report of any recent injury.    Past Medical History:  Diagnosis Date  . Allergy   . Arthritis    "ankles; right hip"  . GERD (gastroesophageal reflux disease)   . Hyperlipidemia   . Itching   . Keloid    "q where; I have keloid skin"  . Menopause   . Migraines 04/01/2012  . Right leg DVT ([place]) 1980's  . Shingles 04/01/2012   "have them q year; usually in Nov; for the last 20 years"  . Shortness of breath 04/01/2012   "when my asthma acts up"  . Stroke [facility])  1977; 04/01/2012   denies residual; "from BCP RX"; little weak right arm; right face little numb"  . Vitamin D deficiency     Patient Active Problem List   Diagnosis Date Noted  . Lymphadenopathy, abdominal 10/11/2017  . TIA (transient ischemic attack) 08/05/2017  . Stroke-like symptoms   . Cough variant asthma with component of UACS 02/04/2016  . Edema 04/29/2013  . Foot swelling 10/21/2012  . Low back pain 08/09/2012  . URI, acute 08/05/2012  . Cough 08/05/2012  . Left hip pain 08/05/2012  . Zoster 07/02/2012  . Right hip pain 05/23/2012  . Herniated disc, cervical 04/09/2012  . Headache, hemiplegic migraine 04/04/2012  . Gait abnormality 04/03/2012  . HTN (hypertension) 04/01/2012  . Finger pain, right 04/01/2012  . Finger, open wounds, complicated 11/91/4782  . Varicose veins 06/27/2011  . Urinary tract infection, site not specified 06/27/2011  . Nevus 06/27/2011  . DVT, lower extremity ([place]) 06/13/2011  . Arthritis   . Asthma in adult   . Hyperlipidemia   . Shingles   . Vitamin D deficiency   . Allergy   . Keloid   . Menopause     Past Surgical History:  Procedure Laterality Date  . ABDOMINAL HYSTERECTOMY  1980's  . [place]  . FINGER SURGERY  10/09/14  . righ knee arthoscopy    . TOTAL HIP  ARTHROPLASTY Right 09-21-15  . VARICOSE VEIN SURGERY  1970's   bilaterally     OB History    Gravida  4   Para  3   Term      Preterm      AB  1   Living        SAB      TAB  1   Ectopic      Multiple      Live Births               Home Medications    Prior to Admission medications   Medication Sig Start Date End Date Taking? Authorizing Provider  albuterol (ACCUNEB) 1.25 MG/3ML nebulizer solution Take 1 ampule by nebulization every 6 (six) hours as needed for wheezing.    [provider]  atorvastatin (LIPITOR) 10 MG tablet Take 1 tablet (10 mg total) by mouth daily. 02/09/15   Ann Held, DO    Cholecalciferol (VITAMIN D-1000 MAX ST) 1000 units tablet Take 1,000 Units by mouth daily.    [provider]  cyclobenzaprine (FLEXERIL) 5 MG tablet Take 5 mg by mouth as needed (Knee Pain).  06/11/17   [provider]  ELIQUIS 5 MG TABS tablet Take 5 mg by mouth 2 (two) times daily. 07/10/17   [provider]  escitalopram (LEXAPRO) 10 MG tablet Take 1 tablet (10 mg total) by mouth daily. 02/09/15   Roma Schanz R, DO  famotidine (PEPCID) 20 MG tablet TAKE 1 TABLET BY MOUTH AT BEDTIME Patient taking differently: TAKE 1 TABLET BY MOUTH AS NEEDED FOR GERD 10/09/16   Tanda Rockers, MD  hydrochlorothiazide (HYDRODIURIL) 25 MG tablet Take by mouth. 02/22/15   [provider]  montelukast (SINGULAIR) 10 MG tablet Take 1 tablet (10 mg total) by mouth at bedtime. 02/09/15   Roma Schanz R, DO  MULTIPLE VITAMIN PO Take 1 tablet by mouth.    [provider]  nabumetone (RELAFEN) 500 MG tablet  09/25/17   [provider]  pantoprazole (PROTONIX) 40 MG tablet TAKE 1 TABLET (40 MG TOTAL) BY MOUTH DAILY. TAKE 30-60 MIN BEFORE FIRST MEAL OF THE DAY 10/10/16   Tanda Rockers, MD  tizanidine (ZANAFLEX) 2 MG capsule Take 1 capsule (2 mg total) by mouth 2 (two) times daily. 12/04/17   Garvin Fila, MD  topiramate (TOPAMAX) 50 MG tablet Take 1 tablet (50 mg total) by mouth 2 (two) times daily. 09/24/17   Garvin Fila, MD    Family History Family History  Problem Relation Age of Onset  . Lung cancer Mother        smoked  . Hyperlipidemia Mother   . Hypertension Mother   . Bone cancer Father   . Breast cancer Cousin   . Sudden death Neg Hx   . Diabetes Neg Hx   . Heart attack Neg Hx     Social History Social History   Tobacco Use  . Smoking status: Never Smoker  . Smokeless tobacco: Never Used  Substance Use Topics  . Alcohol use: No  . Drug use: No     Allergies   Codeine; Levofloxacin; and Rice   Review of  Systems Review of Systems  All other systems reviewed and are negative.    Physical Exam Updated Vital Signs BP 114/69   Pulse 68   Temp 98.1 F (36.7 C) (Oral)   Resp 20   SpO2 100%   Physical  Exam  Constitutional: She is oriented to person, place, and time. She appears well-developed and well-nourished. No distress.  HENT:  Head: Atraumatic.  Eyes: Conjunctivae are normal.  Neck: Neck supple.  Cardiovascular: Normal rate and regular rhythm.  Pulmonary/Chest: Effort normal and breath sounds normal.  Abdominal: Soft. She exhibits no distension. There is no tenderness.  Musculoskeletal: She exhibits tenderness (Tenderness throughout right lower extremity on palpation without focal point tenderness.  Normal skin color, no skin tenting, compartment is soft, pedal pulse palpable with brisk cap refill.  No palpable cords and no erythema.). She exhibits no edema.  Neurological: She is alert and oriented to person, place, and time.  Skin: No rash noted.  Psychiatric: She has a normal mood and affect.  Nursing note and vitals reviewed.    ED Treatments / Results  Labs (all labs ordered are listed, but only abnormal results are displayed) Labs Reviewed - No data to display  EKG None  Radiology No results found.  Procedures Procedures (including critical care time)  Medications Ordered in ED Medications  traMADol (ULTRAM) tablet 100 mg (100 mg Oral Given 12/06/17 1407)     Initial Impression / Assessment and Plan / ED Course  I have reviewed the triage vital signs and the nursing notes.  Pertinent labs & imaging results that were available during my care of the patient were reviewed by me and considered in my medical decision making (see chart for details).     BP (!) 116/104 (BP Location: Left Arm)   Pulse (!) 55   Temp 98.1 F (36.7 C) (Oral)   Resp 16   SpO2 100%    Final Clinical Impressions(s) / ED Diagnoses   Final diagnoses:  Chronic deep vein  thrombosis (DVT) of femoral vein of right lower extremity [facility])    ED Discharge Orders        Ordered    traMADol (ULTRAM) 50 MG tablet  Every 6 hours PRN     12/06/17 1452     1:39 PM Patient had a known diagnosis of positive DVT involving the right distal femoral vein which was performed 3 days ago on 12/03/2017.  She is here for worsening pain of the same leg. Does have past history of DVT involving the right leg on 05/2017 on Eliquis.  At this time, patient does not have any evidence to suggest complication from her chronic DVT such as phlegmasia.  No infectious etiology.  No chest pain or shortness of breath to suggest developing PE.  Patient will be discharged home with tramadol for pain.  Encourage patient to call and follow-up closely with her primary care doctor and her hematologist for further management.  Work note provided as requested. In order to decrease risk of narcotic abuse. Pt's record were checked using the [place] Controlled Substance database.     Domenic Moras, PA-C 12/06/17 1454    Fredia Sorrow, MD 12/06/17 [phone]

## 2017-12-06 NOTE — ED Triage Notes (Signed)
Patient taking eliquis for blood clot in right leg x86months. States pain has gotten severe in last week. Pedal pulse present.

## 2017-12-07 ENCOUNTER — Telehealth: Payer: Self-pay | Admitting: Hematology and Oncology

## 2017-12-07 NOTE — Telephone Encounter (Signed)
Scheduled appt per 5/9 sch msg - spoke w/ pt re appts.  °

## 2017-12-10 ENCOUNTER — Inpatient Hospital Stay: Payer: BLUE CROSS/BLUE SHIELD | Attending: Hematology and Oncology | Admitting: Hematology and Oncology

## 2017-12-10 DIAGNOSIS — M25561 Pain in right knee: Secondary | ICD-10-CM | POA: Diagnosis not present

## 2017-12-10 DIAGNOSIS — Z79899 Other long term (current) drug therapy: Secondary | ICD-10-CM | POA: Diagnosis not present

## 2017-12-10 DIAGNOSIS — R590 Localized enlarged lymph nodes: Secondary | ICD-10-CM | POA: Insufficient documentation

## 2017-12-10 DIAGNOSIS — M7989 Other specified soft tissue disorders: Secondary | ICD-10-CM | POA: Insufficient documentation

## 2017-12-10 NOTE — Assessment & Plan Note (Signed)
CT abdomen and pelvis 09/27/2017 performed for microscopic hematuria and loss of weight revealed infiltrated upper mesentery surrounding SMA and SMV with lymphadenopathy.  These findings may represent either inflammation or other differentials included lymphoma, carcinoid, inflammatory pseudotumor or sclerosing mesenteritis.  Dr. Watt Climes evaluated the patient and obtained a PET CT scan.  PET/CT scan 11/27/2017: Near complete resolution of inflammatory process in the central mesentery.  Favor benign self-limiting process such as sclerosing mesenteritis.  No evidence of lymphoma.  Based on the above reports it appears that these lymph nodes were inflammatory in nature. No further work-up recommended.

## 2017-12-10 NOTE — Progress Notes (Signed)
Patient Care Team: Schoenhoff, Altamese Cabal, MD as PCP - General (Internal Medicine)  DIAGNOSIS:  Encounter Diagnosis  Name Primary?  . Lymphadenopathy, abdominal     CHIEF COMPLIANT:  Follow-up of recent PET/CT scan  INTERVAL HISTORY: Anita Benton is a 62 year old with above-mentioned history of abdominal lymphadenopathy who was referred for gastroenterology consultation with Dr. Lizbeth Bark.  No biopsies were recommended.   PET CT scan was obtained which did not show any evidence of recurrent disease.  It was felt to be all infection/inflammation related.She also has recurrent DVT and is now on Eliquis. She is complaining of severe pain in the right knee and right leg.  She went to the emergency room.  She had an ultrasound of the leg which showed chronic venous changes but no acute DVT.  She was seen by [facility] orthopedics in [facility] and was informed that the pain was not related to her knee.  She has great difficulty putting weight on her knee.  She also has swelling and bruising around the knee. Because of this pain and discomfort she is unable to work.  She is in tears with the amount of pain that she is having.  She cannot even stand up on both her feet even for a few seconds.  She needed a wheelchair for ambulation today.  REVIEW OF SYSTEMS:   Constitutional: Denies fevers, chills or abnormal weight loss Eyes: Denies blurriness of vision Ears, nose, mouth, throat, and face: Denies mucositis or sore throat Respiratory: Denies cough, dyspnea or wheezes Cardiovascular: Denies palpitation, chest discomfort Gastrointestinal:  Denies nausea, heartburn or change in bowel habits Skin: Denies abnormal skin rashes Lymphatics: Denies new lymphadenopathy or easy bruising Neurological:Denies numbness, tingling or new weaknesses Behavioral/Psych: Mood is stable, no new changes  Extremities: Right knee swelling and pain and discomfort Breast:  denies any pain or lumps or nodules in  either breasts All other systems were reviewed with the patient and are negative.  I have reviewed the past medical history, past surgical history, social history and family history with the patient and they are unchanged from previous note.  ALLERGIES:  is allergic to codeine; levofloxacin; and rice.  MEDICATIONS:  Current Outpatient Medications  Medication Sig Dispense Refill  . albuterol (ACCUNEB) 1.25 MG/3ML nebulizer solution Take 1 ampule by nebulization every 6 (six) hours as needed for wheezing.    Marland Kitchen atorvastatin (LIPITOR) 10 MG tablet Take 1 tablet (10 mg total) by mouth daily. 90 tablet 1  . Cholecalciferol (VITAMIN D-1000 MAX ST) 1000 units tablet Take 1,000 Units by mouth daily.    . cyclobenzaprine (FLEXERIL) 5 MG tablet Take 5 mg by mouth as needed (Knee Pain).   0  . ELIQUIS 5 MG TABS tablet Take 5 mg by mouth 2 (two) times daily.  2  . escitalopram (LEXAPRO) 10 MG tablet Take 1 tablet (10 mg total) by mouth daily. 90 tablet 1  . famotidine (PEPCID) 20 MG tablet TAKE 1 TABLET BY MOUTH AT BEDTIME (Patient taking differently: TAKE 1 TABLET BY MOUTH AS NEEDED FOR GERD) 90 tablet 0  . hydrochlorothiazide (HYDRODIURIL) 25 MG tablet Take by mouth.    . montelukast (SINGULAIR) 10 MG tablet Take 1 tablet (10 mg total) by mouth at bedtime. 90 tablet 1  . MULTIPLE VITAMIN PO Take 1 tablet by mouth.    . nabumetone (RELAFEN) 500 MG tablet   0  . pantoprazole (PROTONIX) 40 MG tablet TAKE 1 TABLET (40 MG TOTAL) BY MOUTH DAILY.  TAKE 30-60 MIN BEFORE FIRST MEAL OF THE DAY 90 tablet 0  . tizanidine (ZANAFLEX) 2 MG capsule Take 1 capsule (2 mg total) by mouth 2 (two) times daily. 30 capsule 3  . topiramate (TOPAMAX) 50 MG tablet Take 1 tablet (50 mg total) by mouth 2 (two) times daily. 60 tablet 3  . traMADol (ULTRAM) 50 MG tablet Take 1 tablet (50 mg total) by mouth every 6 (six) hours as needed for moderate pain or severe pain. 10 tablet 0   No current facility-administered medications for  this visit.     PHYSICAL EXAMINATION: ECOG PERFORMANCE STATUS: 1 - Symptomatic but completely ambulatory  Vitals:   12/10/17 1429  BP: 92/69  Pulse: 84  Resp: 17  Temp: 98.3 F (36.8 C)  SpO2: 100%   Filed Weights   12/10/17 1429  Weight: 199 lb 8 oz (90.5 kg)    GENERAL:alert, no distress and comfortable SKIN: skin color, texture, turgor are normal, no rashes or significant lesions EYES: normal, Conjunctiva are pink and non-injected, sclera clear OROPHARYNX:no exudate, no erythema and lips, buccal mucosa, and tongue normal  NECK: supple, thyroid normal size, non-tender, without nodularity LYMPH:  no palpable lymphadenopathy in the cervical, axillary or inguinal LUNGS: clear to auscultation and percussion with normal breathing effort HEART: regular rate & rhythm and no murmurs and no lower extremity edema ABDOMEN:abdomen soft, non-tender and normal bowel sounds MUSCULOSKELETAL:no cyanosis of digits and no clubbing  NEURO: alert & oriented x 3 with fluent speech, no focal motor/sensory deficits EXTREMITIES: No lower extremity edema   LABORATORY DATA:  I have reviewed the data as listed CMP Latest Ref Rng & Units 10/11/2017 08/04/2017 09/16/2016  Glucose 70 - 140 mg/dL 72 98 93  BUN 7 - 26 mg/dL 16 17 13   Creatinine 0.60 - 1.10 mg/dL 0.76 1.01(H) 0.82  Sodium 136 - 145 mmol/L 142 140 138  Potassium 3.5 - 5.1 mmol/L 3.3(L) 3.8 3.9  Chloride 98 - 109 mmol/L 112(H) 106 102  CO2 22 - 29 mmol/L 22 26 28   Calcium 8.4 - 10.4 mg/dL 9.5 9.2 8.6(L)  Total Protein 6.4 - 8.3 g/dL 7.4 7.2 7.5  Total Bilirubin 0.2 - 1.2 mg/dL 0.4 0.5 0.4  Alkaline Phos 40 - 150 U/L 96 93 75  AST 5 - 34 U/L 14 22 45(H)  ALT 0 - 55 U/L 13 21 26     Lab Results  Component Value Date   WBC 6.4 10/11/2017   HGB 13.8 10/11/2017   HCT 41.1 10/11/2017   MCV 87.3 10/11/2017   PLT 312 10/11/2017   NEUTROABS 3.8 10/11/2017    ASSESSMENT & PLAN:  Lymphadenopathy, abdominal CT abdomen and pelvis  09/27/2017 performed for microscopic hematuria and loss of weight revealed infiltrated upper mesentery surrounding SMA and SMV with lymphadenopathy.  These findings may represent either inflammation or other differentials included lymphoma, carcinoid, inflammatory pseudotumor or sclerosing mesenteritis.  Dr. Watt Climes evaluated the patient and obtained a PET CT scan.  PET/CT scan 11/27/2017: Near complete resolution of inflammatory process in the central mesentery.  Favor benign self-limiting process such as sclerosing mesenteritis.  No evidence of lymphoma. Based on the above reports it appears that these lymph nodes were inflammatory in nature. No further work-up recommended.  Severe right knee pain: I discussed with her that based upon the ultrasound report there is no acute DVT.  Therefore I do not believe that there is any problem with continuation of Eliquis.  I strongly think that the pain is  related to her knee joint.  I will call her orthopedic surgeon's office to discuss the case further.  I suspect that she may need another MRI of the knee to evaluate the cause of that intense pain.  I offered filling paperwork for short-term disability with her employer's.  No orders of the defined types were placed in this encounter.  The patient has a good understanding of the overall plan. she agrees with it. she will call with any problems that may develop before the next visit here.   Harriette Ohara, MD 12/10/17

## 2017-12-11 ENCOUNTER — Ambulatory Visit: Payer: Self-pay | Admitting: Hematology and Oncology

## 2017-12-12 NOTE — Progress Notes (Signed)
Disability completed and mailed to patient address on file.

## 2017-12-14 ENCOUNTER — Ambulatory Visit: Payer: BLUE CROSS/BLUE SHIELD | Admitting: Hematology and Oncology

## 2017-12-17 ENCOUNTER — Telehealth: Payer: Self-pay

## 2017-12-17 NOTE — Telephone Encounter (Signed)
Pt called to request a stronger pain medication from [place] to deal with her R knee pain from current blood clot. Pt was referred back to her ortho MD at [place] to discuss about her severe knee pain. Pt states that she has a scheduled appt on 01/01/18 but is having to deal with worsening pain.   Pt wanted to know if Dr.Gudena had called the ortho dr in [place]. Told pt that he is currently out of the office today but will notify and remind him to call tomorrow. Will also discuss pain medication while she waits on her appt with ortho MD. PT verbalized understanding. Told pt will call tomorrow and update her of more information. Advised that pt use tylenol extra strength to help with pain in the meantime. Advised pt to go to the ED right away if pain becomes persistent and leg becomes increasingly swollen or loss of sensation in the leg. Pt voiced understanding and will call MD immediately.

## 2017-12-19 ENCOUNTER — Telehealth: Payer: Self-pay | Admitting: Hematology and Oncology

## 2017-12-19 NOTE — Telephone Encounter (Signed)
I called and discussed the case with her orthopedic surgeon and then call the patient. I discussed that the pain is not coming from her blood clots.  It is coming from the knee joint itself.  She is having difficulty with putting any weight on that knee.  She was informed to call her surgeon and make an appointment to come in and be seen.  Her potential options could be Synvisc injection versus knee replacement surgery. I informed the patient about all of these options on the phone.  She will call their office and see if she can be seen sooner.

## 2017-12-20 ENCOUNTER — Other Ambulatory Visit: Payer: Self-pay | Admitting: Internal Medicine

## 2017-12-20 DIAGNOSIS — Z1231 Encounter for screening mammogram for malignant neoplasm of breast: Secondary | ICD-10-CM

## 2017-12-21 ENCOUNTER — Ambulatory Visit: Payer: BLUE CROSS/BLUE SHIELD

## 2017-12-21 ENCOUNTER — Ambulatory Visit
Admission: RE | Admit: 2017-12-21 | Discharge: 2017-12-21 | Disposition: A | Discharge status: Home / Self Care | Payer: BLUE CROSS/BLUE SHIELD | Source: Ambulatory Visit | Attending: Internal Medicine | Admitting: Internal Medicine

## 2017-12-21 DIAGNOSIS — Z1231 Encounter for screening mammogram for malignant neoplasm of breast: Secondary | ICD-10-CM

## 2017-12-25 NOTE — Progress Notes (Signed)
Additional disability paperwork completed and patient requested to pick up at [facility]. Paperwork given to Ms. Wilma at front desk.

## 2018-01-11 ENCOUNTER — Encounter (HOSPITAL_COMMUNITY): Payer: Self-pay | Admitting: Radiology

## 2018-01-11 ENCOUNTER — Ambulatory Visit (HOSPITAL_COMMUNITY)
Admission: RE | Admit: 2018-01-11 | Discharge: 2018-01-11 | Disposition: A | Discharge status: Home / Self Care | Payer: BLUE CROSS/BLUE SHIELD | Source: Ambulatory Visit | Attending: Hematology and Oncology | Admitting: Hematology and Oncology

## 2018-01-11 DIAGNOSIS — L049 Acute lymphadenitis, unspecified: Secondary | ICD-10-CM | POA: Insufficient documentation

## 2018-01-11 MED ORDER — IOPAMIDOL (ISOVUE-300) INJECTION 61%
INTRAVENOUS | Status: AC
  Filled 2018-01-11: qty 100

## 2018-01-11 MED ORDER — IOPAMIDOL (ISOVUE-300) INJECTION 61%
100.0000 mL | Freq: Once | INTRAVENOUS | Status: AC | PRN
  Administered 2018-01-11: 100 mL via INTRAVENOUS

## 2018-01-16 ENCOUNTER — Other Ambulatory Visit: Payer: Self-pay | Admitting: Neurology

## 2018-01-17 ENCOUNTER — Inpatient Hospital Stay: Payer: BLUE CROSS/BLUE SHIELD | Attending: Hematology and Oncology | Admitting: Hematology and Oncology

## 2018-01-17 DIAGNOSIS — M1711 Unilateral primary osteoarthritis, right knee: Secondary | ICD-10-CM | POA: Insufficient documentation

## 2018-01-17 NOTE — Assessment & Plan Note (Deleted)
CT abdomen and pelvis 09/27/2017 performed for microscopic hematuria and loss of weight revealed infiltrated upper mesentery surrounding SMA and SMV with lymphadenopathy. These findings may represent either inflammation or other differentials included lymphoma, carcinoid, inflammatory pseudotumor or sclerosing mesenteritis.  Dr. Watt Climes evaluated the patient and obtained a PET CT scan.  PET/CT scan 11/27/2017: Near complete resolution of inflammatory process in the central mesentery.  Favor benign self-limiting process such as sclerosing mesenteritis.  No evidence of lymphoma.  Recommendation: The lymph nodes are reactive/inflammatory in nature no further work-up is recommended

## 2018-01-24 ENCOUNTER — Telehealth: Payer: Self-pay

## 2018-01-24 NOTE — Telephone Encounter (Signed)
Returned patient's call regarding scheduling an appointment with Dr. Lindi Adie.  Patient was originally scheduled for 01/17/2018 however missed appointment and would like to reschedule.  Wanted to have appointment within the next week or so due to upcoming surgery.  Notified patient that we will send scheduling a message to have appointment scheduled and they will contact her.  Patient voiced agreement and understanding.  No further concerns at this time.

## 2018-01-25 ENCOUNTER — Telehealth: Payer: Self-pay | Admitting: Hematology and Oncology

## 2018-01-25 NOTE — Telephone Encounter (Signed)
Left message for patient per 6/27 sch message - for patient to call back and reschedule missed appt from 6/20

## 2018-02-04 ENCOUNTER — Inpatient Hospital Stay: Payer: BLUE CROSS/BLUE SHIELD | Attending: Hematology and Oncology | Admitting: Hematology and Oncology

## 2018-02-04 DIAGNOSIS — M545 Low back pain: Secondary | ICD-10-CM | POA: Diagnosis not present

## 2018-02-04 DIAGNOSIS — Z7901 Long term (current) use of anticoagulants: Secondary | ICD-10-CM | POA: Diagnosis not present

## 2018-02-04 DIAGNOSIS — M25561 Pain in right knee: Secondary | ICD-10-CM | POA: Diagnosis not present

## 2018-02-04 DIAGNOSIS — I82591 Chronic embolism and thrombosis of other specified deep vein of right lower extremity: Secondary | ICD-10-CM

## 2018-02-04 DIAGNOSIS — R590 Localized enlarged lymph nodes: Secondary | ICD-10-CM | POA: Diagnosis not present

## 2018-02-04 DIAGNOSIS — Z79899 Other long term (current) drug therapy: Secondary | ICD-10-CM | POA: Insufficient documentation

## 2018-02-04 DIAGNOSIS — Z86718 Personal history of other venous thrombosis and embolism: Secondary | ICD-10-CM | POA: Diagnosis not present

## 2018-02-04 NOTE — Assessment & Plan Note (Signed)
Currently on Eliquis Severe right knee pain: Orthopedic in nature. No further hematology recommendations regarding this.

## 2018-02-04 NOTE — Assessment & Plan Note (Signed)
PET/CT scan 11/27/2017: Near complete resolution of inflammatory process in the central mesentery.  Favor benign self-limiting process such as sclerosing mesenteritis.  No evidence of lymphoma. Based on the above reports it appears that these lymph nodes were inflammatory in nature. No further work-up recommended.  Return to clinic on an as-needed basis.

## 2018-02-04 NOTE — Progress Notes (Signed)
Patient Care Team: Schoenhoff, Altamese Cabal, MD as PCP - General (Internal Medicine)  DIAGNOSIS:  Encounter Diagnoses  Name Primary?  . Chronic deep vein thrombosis (DVT) of other vein of right lower extremity ([place])   . Lymphadenopathy, abdominal    CHIEF COMPLIANT: Continuing problems with severe pain in the right knee  INTERVAL HISTORY: Anita Benton is a 62 year old with above-mentioned history of right leg DVT below knee who is currently on anticoagulant with Eliquis.  She appears to be tolerating Eliquis extremely well.  She has had severe pain and I discussed with her orthopedic surgeon who had injected Synvisc into her knee but that also did not help and she is scheduled to undergo knee replacement surgery and at this month.  She is extremely happy to hear that news.  She is previously had a right hip replacement.  She is trying her best to lose weight.  She is now walking with the help of a walker.  Because of her gait instability she is experiencing some pain in the right lower back.  REVIEW OF SYSTEMS:   Constitutional: Denies fevers, chills or abnormal weight loss Eyes: Denies blurriness of vision Ears, nose, mouth, throat, and face: Denies mucositis or sore throat Respiratory: Denies cough, dyspnea or wheezes Cardiovascular: Denies palpitation, chest discomfort Gastrointestinal:  Denies nausea, heartburn or change in bowel habits Skin: Denies abnormal skin rashes Lymphatics: Denies new lymphadenopathy or easy bruising Neurological:Denies numbness, tingling   Behavioral/Psych: Mood is stable, no new changes  Extremities: No lower extremity edema  All other systems were reviewed with the patient and are negative.  I have reviewed the past medical history, past surgical history, social history and family history with the patient and they are unchanged from previous note.  ALLERGIES:  is allergic to codeine; levofloxacin; and rice.  MEDICATIONS:  Current Outpatient  Medications  Medication Sig Dispense Refill  . albuterol (ACCUNEB) 1.25 MG/3ML nebulizer solution Take 1 ampule by nebulization every 6 (six) hours as needed for wheezing.    Marland Kitchen atorvastatin (LIPITOR) 10 MG tablet Take 1 tablet (10 mg total) by mouth daily. 90 tablet 1  . Cholecalciferol (VITAMIN D-1000 MAX ST) 1000 units tablet Take 1,000 Units by mouth daily.    . cyclobenzaprine (FLEXERIL) 5 MG tablet Take 5 mg by mouth as needed (Knee Pain).   0  . ELIQUIS 5 MG TABS tablet Take 5 mg by mouth 2 (two) times daily.  2  . escitalopram (LEXAPRO) 10 MG tablet Take 1 tablet (10 mg total) by mouth daily. 90 tablet 1  . famotidine (PEPCID) 20 MG tablet TAKE 1 TABLET BY MOUTH AT BEDTIME (Patient taking differently: TAKE 1 TABLET BY MOUTH AS NEEDED FOR GERD) 90 tablet 0  . hydrochlorothiazide (HYDRODIURIL) 25 MG tablet Take by mouth.    . montelukast (SINGULAIR) 10 MG tablet Take 1 tablet (10 mg total) by mouth at bedtime. 90 tablet 1  . MULTIPLE VITAMIN PO Take 1 tablet by mouth.    . nabumetone (RELAFEN) 500 MG tablet   0  . pantoprazole (PROTONIX) 40 MG tablet TAKE 1 TABLET (40 MG TOTAL) BY MOUTH DAILY. TAKE 30-60 MIN BEFORE FIRST MEAL OF THE DAY 90 tablet 0  . tizanidine (ZANAFLEX) 2 MG capsule Take 1 capsule (2 mg total) by mouth 2 (two) times daily. 30 capsule 3  . tizanidine (ZANAFLEX) 2 MG capsule TAKE 1 CAPSULE BY MOUTH AT BEDTIME. 30 capsule 3  . topiramate (TOPAMAX) 50 MG tablet TAKE 1 TABLET BY  MOUTH TWICE A DAY 60 tablet 3  . traMADol (ULTRAM) 50 MG tablet Take 1 tablet (50 mg total) by mouth every 6 (six) hours as needed for moderate pain or severe pain. 10 tablet 0   No current facility-administered medications for this visit.     PHYSICAL EXAMINATION: ECOG PERFORMANCE STATUS: 1 - Symptomatic but completely ambulatory  Vitals:   02/04/18 0818  BP: (!) 138/91  Pulse: 80  Resp: 18  Temp: 98.3 F (36.8 C)  SpO2: 97%   Filed Weights   02/04/18 0818  Weight: 198 lb 3.2 oz (89.9  kg)    GENERAL:alert, no distress and comfortable SKIN: skin color, texture, turgor are normal, no rashes or significant lesions EYES: normal, Conjunctiva are pink and non-injected, sclera clear OROPHARYNX:no exudate, no erythema and lips, buccal mucosa, and tongue normal  NECK: supple, thyroid normal size, non-tender, without nodularity LYMPH:  no palpable lymphadenopathy in the cervical, axillary or inguinal LUNGS: clear to auscultation and percussion with normal breathing effort HEART: regular rate & rhythm and no murmurs and no lower extremity edema ABDOMEN:abdomen soft, non-tender and normal bowel sounds MUSCULOSKELETAL:no cyanosis of digits and no clubbing  NEURO: alert & oriented x 3 with fluent speech, no focal motor/sensory deficits EXTREMITIES: No lower extremity edema  LABORATORY DATA:  I have reviewed the data as listed CMP Latest Ref Rng & Units 10/11/2017 08/04/2017 09/16/2016  Glucose 70 - 140 mg/dL 72 98 93  BUN 7 - 26 mg/dL 16 17 13   Creatinine 0.60 - 1.10 mg/dL 0.76 1.01(H) 0.82  Sodium 136 - 145 mmol/L 142 140 138  Potassium 3.5 - 5.1 mmol/L 3.3(L) 3.8 3.9  Chloride 98 - 109 mmol/L 112(H) 106 102  CO2 22 - 29 mmol/L 22 26 28   Calcium 8.4 - 10.4 mg/dL 9.5 9.2 8.6(L)  Total Protein 6.4 - 8.3 g/dL 7.4 7.2 7.5  Total Bilirubin 0.2 - 1.2 mg/dL 0.4 0.5 0.4  Alkaline Phos 40 - 150 U/L 96 93 75  AST 5 - 34 U/L 14 22 45(H)  ALT 0 - 55 U/L 13 21 26     Lab Results  Component Value Date   WBC 6.4 10/11/2017   HGB 13.8 10/11/2017   HCT 41.1 10/11/2017   MCV 87.3 10/11/2017   PLT 312 10/11/2017   NEUTROABS 3.8 10/11/2017    ASSESSMENT & PLAN:  DVT, lower extremity (HCC) Currently on Eliquis Severe right knee pain: Orthopedic in nature. No further hematology recommendations regarding this. If her knee issues resolve, we can determine if we are ready to stop her anticoagulation in 6 months.  She is getting a knee replacement surgery and of July  2019.  Lymphadenopathy, abdominal PET/CT scan 11/27/2017: Near complete resolution of inflammatory process in the central mesentery.  Favor benign self-limiting process such as sclerosing mesenteritis.  No evidence of lymphoma. Based on the above reports it appears that these lymph nodes were inflammatory in nature. No further work-up recommended.  Return to clinic in 6 months for follow-up to see if we can discontinue anticoagulation.  No orders of the defined types were placed in this encounter.  The patient has a good understanding of the overall plan. she agrees with it. she will call with any problems that may develop before the next visit here.   Harriette Ohara, MD 02/04/18

## 2018-02-05 ENCOUNTER — Telehealth: Payer: Self-pay | Admitting: Hematology and Oncology

## 2018-02-05 NOTE — Telephone Encounter (Signed)
Mailed patient calendar of upcoming jan appts.

## 2018-02-18 ENCOUNTER — Telehealth: Payer: Self-pay

## 2018-02-18 NOTE — Telephone Encounter (Signed)
Pt called and wanted to report that her R ankle was very swollen yesterday and was hurting. Pt is about to have knee replacement surgery next wed and would like to make sure that she does not have another clot forming.   Pt states that today, pt elevated leg and ankle swelling and pain are much improved. She had been on her feet a lot over the weekend and it was very hot outside. Told pt to continue to monitor today and tomorrow. If the swelling comes back and gets worse and becomes more painful, we can have her get checked in the office if needed, or have her call primary care, since Dr.Gudena is out of the office this week. Advised that pt stays off her feet and elevate legs when she can. Denies any tenderness or pain in her right calk or ankle at this time. No sob/dizzinnes reported today, and is still on eloquis daily.   Pt has verbalized understanding and will call with any further/worsening symptoms of her R leg and swelling.

## 2018-02-21 DIAGNOSIS — Z0271 Encounter for disability determination: Secondary | ICD-10-CM

## 2018-03-06 ENCOUNTER — Ambulatory Visit: Payer: BLUE CROSS/BLUE SHIELD | Admitting: Adult Health

## 2018-03-08 ENCOUNTER — Telehealth: Payer: Self-pay

## 2018-03-08 NOTE — Telephone Encounter (Signed)
Nurse returned patient's call.  Patient c/o right ankle swelling.  Patient had total right knee replacement on 02/27/2018.  Nurse informed patient to contact surgeon's office.  Nurse called back at later time to ensure patient received feedback from surgeon.  Patient was able to speak with nurse at their office.  No further needs at this time.

## 2018-03-14 ENCOUNTER — Telehealth: Payer: Self-pay | Admitting: *Deleted

## 2018-03-14 MED ORDER — ELIQUIS 5 MG PO TABS: 5.0000 mg | ORAL_TABLET | Freq: Two times a day (BID) | ORAL | 2 refills | Status: DC

## 2018-03-14 NOTE — Telephone Encounter (Signed)
TC from patient requesting refill of her Eliquis. Reviewed Dr. Geralyn Flash notes and pt to remain on this for another 6 months. Refill request sent to [facility].

## 2018-04-11 ENCOUNTER — Telehealth: Payer: Self-pay

## 2018-04-11 NOTE — Telephone Encounter (Signed)
Returned patient's call.  Patient having a knee procedure in the next few weeks by Dr. Len Childs at [facility].  Patient wanting to inform Dr. Lindi Adie.  No further needs at this time.

## 2018-05-09 ENCOUNTER — Other Ambulatory Visit: Payer: Self-pay | Admitting: Neurology

## 2018-05-13 ENCOUNTER — Other Ambulatory Visit: Payer: Self-pay | Admitting: Neurology

## 2018-05-14 ENCOUNTER — Ambulatory Visit: Payer: BLUE CROSS/BLUE SHIELD | Admitting: Adult Health

## 2018-05-14 ENCOUNTER — Encounter: Payer: Self-pay | Admitting: Adult Health

## 2018-05-14 VITALS — BP 112/75 | HR 85 | Ht 64.0 in | Wt 180.2 lb

## 2018-05-14 DIAGNOSIS — R29818 Other symptoms and signs involving the nervous system: Secondary | ICD-10-CM | POA: Diagnosis not present

## 2018-05-14 DIAGNOSIS — M316 Other giant cell arteritis: Secondary | ICD-10-CM

## 2018-05-14 DIAGNOSIS — G459 Transient cerebral ischemic attack, unspecified: Secondary | ICD-10-CM

## 2018-05-14 DIAGNOSIS — E785 Hyperlipidemia, unspecified: Secondary | ICD-10-CM

## 2018-05-14 DIAGNOSIS — Z8673 Personal history of transient ischemic attack (TIA), and cerebral infarction without residual deficits: Secondary | ICD-10-CM

## 2018-05-14 DIAGNOSIS — I10 Essential (primary) hypertension: Secondary | ICD-10-CM

## 2018-05-14 DIAGNOSIS — I82591 Chronic embolism and thrombosis of other specified deep vein of right lower extremity: Secondary | ICD-10-CM

## 2018-05-14 MED ORDER — PREDNISONE 20 MG PO TABS: 60.0000 mg | ORAL_TABLET | Freq: Every day | ORAL | 0 refills | Status: DC

## 2018-05-14 MED ORDER — DIAZEPAM 10 MG PO TABS: 10.0000 mg | ORAL_TABLET | Freq: Once | ORAL | 0 refills | Status: AC

## 2018-05-14 NOTE — Patient Instructions (Signed)
Continue Eliquis (apixaban) daily  and lipitor  for secondary stroke prevention  Continue to follow up with PCP regarding cholesterol and blood pressure management   We will do MRI of your head to ensure you have not had a new stroke  We will check lab work today to rule out temporal arteritis along with starting prednisone 60mg  daily. We will wean this off if we find we do not have temporal arteritis   Continue to monitor blood pressure at home  Maintain strict control of hypertension with blood pressure goal below 130/90, diabetes with hemoglobin A1c goal below 6.5% and cholesterol with LDL cholesterol (bad cholesterol) goal below 70 mg/dL. I also advised the patient to eat a healthy diet with plenty of whole grains, cereals, fruits and vegetables, exercise regularly and maintain ideal body weight.  Followup in the future with me in 2 months or call earlier if needed       Thank you for coming to see [place] at [facility]. I hope we have been able to provide you high quality care today.  You may receive a patient satisfaction survey over the next few weeks. We would appreciate your feedback and comments so that we may continue to improve ourselves and the health of our patients.

## 2018-05-14 NOTE — Progress Notes (Signed)
[facility] [address] [place]. [place] [address] [phone]       OFFICE FOLLOW-UP NOTE  [name] Date of Birth:  [birth year] Medical Record Number:  [id number]   Chief Complaint  Patient presents with  . Follow-up    Chronic neck pain and headache room 9 in back hallway pt alone pt had total knee replacement in July 2019 pt in therapy for knee surgery      HPI: initial visit 09/24/17 :Ms. Riera is a 62 year old lady seen today for the first office follow-up visit following hospital admission for TIA versus computed migraine in January 2019. History is obtained from the patient and review of electronic medical records. I have personally reviewed imaging films.Katessa Attridge is an 62 y.o. female  PMH of DVT- R leg 05/2017 on Eliquis, Hx of CVA x 2, HLD, Asthma, Obesity, recent history of Shingles presents to outside hospital on 08/04/2017 with complaints of left sided numbness. Dr Cheral Marker was contacted by phone and agreed to have the patient transferred to [facility] for stroke evaluation. Initial Head CT negative for acute findings.Her initial symptoms at [facility] were Left facial numbness, slow speech and Left hand weakness. On exam this evening at [facility] patient is complaining of Left sided numbness on her entire body, worse on her face. She is complaining of a Left sided H/A 8/10 with photophobia, blurred vision or nausea. She has not asked for pain medication. She is complaining of Left sided hand weakness. No dysarthria or slow speech noted on exam. She is also complaining of being "itchy" all over her body. No active shingles lesions noted on face or body. Date last known well: Date: 08/04/2017 Time last known well: Time: 21:30 tPA Given: No: outside the window.Modified Rankin: Rankin Score=0. MRI scan of the brain showed no acute abnormality. There was small vessel disease changes which were progressed compared to previous MRI from  2013. MRI of the brain showed no significant intracranial stenosis. Carotid ultrasound and transthoracic echo were unremarkable. Patient was already on eliquis due to recent deep vein thrombosis which was continued. Patient states she's done well since discharge has not had any recurrent TIA or stroke symptoms however she's been having frequent headaches. These headaches began with this episode and have not resolved over the last month and a half She in fact describes now headache occurring on a daily basis and continuous. The headache is mostly on the left side she describes this as a constant 10/10 headache which is mostly aching in character. Headache appears to be diminished in sleep. She is able to work moredue to that despite the headache. The headache is accompanied by nausea and occasional vomiting light sensitivity. She does feel better if she lies down and goes to sleep. She's tried taking Tylenol and Excedrin without relief in her primary care physician as recently prescribed to a muscle relaxant #1 it tone twice daily which is also not helping. She does complain of neck pain and spasm and tightness and has trouble moving the neck in either direction. She does have remote history of cluster headaches   but denies migraine headache history  Update 12/04/2017 ; she returns for follow-up after last visit 2 months ago. She reports that her headaches are significantly better and in fact she cannot remember the last time she had one. She is tolerating Topamax 25 mg twice daily quite well without any side effects. She states she is still bothered by neck pain which  he describes of the base of the neck on the left side. It is intermittent and sharp. She has not tried local heat application. She does take Zanaflex 2 mg at night which seems to help her sleep well. She has not tried a daytime dose yet. She seems frustrated that despite 6 weeks of eliquis she still has persistent DVT in her leg and would need to take  eliquis for longer period of time. She has no new complaints today.  Interval history 05/14/2018: Patient returns today for follow-up visit.  Since prior office visit, patient underwent right knee surgery and since this time states she has experienced increased frequency of headaches occurring approximately 3 times daily with a pressure sensation which are accompanied by photophobia, phonophobia and nausea.  She does endorse compliance with Topamax 50 mg twice daily along with Zanaflex 2 mg twice daily.  these are debilitating as she does need to lay down when they occur but denies any change in vision.  Prior migraines also occurred on the left side but more into her neck with these are mainly located in her temporal area.  She also states since this procedure her memory has been worse especially with short-term information and states her boyfriend gets frustrated with her as she has difficulty remembering certain things.  She continues on Eliquis for chronic DVT management along with Lipitor for HLD and secondary stroke prevention.  She does endorse pain in her right knee where she continues PT and is frustrated with her lack of progress.  No further concerns at this time.  Upon assessment, patient was found to have decreased sensation on left face, left arm and left leg which she has been seen in the ED for previously but per patient, have resolved at that time.  She also endorses sensitivity in left temporal area upon palpitation.  Was found to have blurry peripheral vision in left eye only which patient has declined noticing prior to this exam.    ROS:   14 system review of systems is positive for numbness and headache and all other systems negative  PMH:  Past Medical History:  Diagnosis Date  . Allergy   . Arthritis    "ankles; right hip"  . GERD (gastroesophageal reflux disease)   . Hyperlipidemia   . Itching   . Keloid    "q where; I have keloid skin"  . Menopause   . Migraines 04/01/2012   . Right leg DVT ([place]) 1980's  . Shingles 04/01/2012   "have them q year; usually in Nov; for the last 20 years"  . Shortness of breath 04/01/2012   "when my asthma acts up"  . Stroke [facility]) 1977; 04/01/2012   denies residual; "from BCP RX"; little weak right arm; right face little numb"  . Vitamin D deficiency     Social History:  Social History   Socioeconomic History  . Marital status: Divorced    Spouse name: Not on file  . Number of children: Not on file  . Years of education: Not on file  . Highest education level: Not on file  Occupational History  . Not on file  Social Needs  . Financial resource strain: Not on file  . Food insecurity:    Worry: Not on file    Inability: Not on file  . Transportation needs:    Medical: Not on file    Non-medical: Not on file  Tobacco Use  . Smoking status: Never Smoker  . Smokeless tobacco: Never  Used  Substance and Sexual Activity  . Alcohol use: No  . Drug use: No  . Sexual activity: Not on file  Lifestyle  . Physical activity:    Days per week: Not on file    Minutes per session: Not on file  . Stress: Not on file  Relationships  . Social connections:    Talks on phone: Not on file    Gets together: Not on file    Attends religious service: Not on file    Active member of club or organization: Not on file    Attends meetings of clubs or organizations: Not on file    Relationship status: Not on file  . Intimate partner violence:    Fear of current or ex partner: Not on file    Emotionally abused: Not on file    Physically abused: Not on file    Forced sexual activity: Not on file  Other Topics Concern  . Not on file  Social History Narrative  . Not on file    Medications:   Current Outpatient Medications on File Prior to Visit  Medication Sig Dispense Refill  . albuterol (ACCUNEB) 1.25 MG/3ML nebulizer solution Take 1 ampule by nebulization every 6 (six) hours as needed for wheezing.    Marland Kitchen apixaban (ELIQUIS) 5 MG  TABS tablet Take by mouth 2 (two) times daily.     Marland Kitchen atorvastatin (LIPITOR) 10 MG tablet Take 1 tablet (10 mg total) by mouth daily. 90 tablet 1  . Cholecalciferol (VITAMIN D-1000 MAX ST) 1000 units tablet Take 1,000 Units by mouth daily.    . cyclobenzaprine (FLEXERIL) 5 MG tablet Take 5 mg by mouth as needed (Knee Pain).   0  . escitalopram (LEXAPRO) 10 MG tablet Take 1 tablet (10 mg total) by mouth daily. 90 tablet 1  . famotidine (PEPCID) 20 MG tablet TAKE 1 TABLET BY MOUTH AT BEDTIME (Patient taking differently: TAKE 1 TABLET BY MOUTH AS NEEDED FOR GERD) 90 tablet 0  . gabapentin (NEURONTIN) 300 MG capsule gabapentin 300 mg capsule  TAKE 1 CAPSULE(S) 3 TIMES A DAY BY ORAL ROUTE AS DIRECTED.    Marland Kitchen montelukast (SINGULAIR) 10 MG tablet Take 1 tablet (10 mg total) by mouth at bedtime. 90 tablet 1  . MULTIPLE VITAMIN PO Take 1 tablet by mouth.    . pantoprazole (PROTONIX) 40 MG tablet TAKE 1 TABLET (40 MG TOTAL) BY MOUTH DAILY. TAKE 30-60 MIN BEFORE FIRST MEAL OF THE DAY 90 tablet 0  . tizanidine (ZANAFLEX) 2 MG capsule TAKE 1 CAPSULE BY MOUTH AT BEDTIME. 30 capsule 6  . topiramate (TOPAMAX) 50 MG tablet TAKE 1 TABLET BY MOUTH TWICE A DAY 180 tablet 1   No current facility-administered medications on file prior to visit.     Allergies:   Allergies  Allergen Reactions  . Codeine Rash  . Levofloxacin Itching and Swelling    Lip swelling  . Rice Shortness Of Breath and Rash    Physical Exam General: well developed, well nourished middle-aged lady, seated, in no evident distress Head: head normocephalic and atraumatic.  Neck: supple with no carotid or supraclavicular bruits Cardiovascular: regular rate and rhythm, no murmurs Musculoskeletal: no deformity  Skin:  no rash/petichiae Vascular:  Normal pulses all extremities; unable to adequately palpate left temporal pulse due to increased pain Vitals:   05/14/18 1446  BP: 112/75  Pulse: 85   Neurologic Exam Mental Status: Awake and  fully alert. Oriented to place and time. Recent and  remote memory intact. Attention span, concentration and fund of knowledge appropriate. Mood and affect appropriate.  Cranial Nerves: Fundoscopic exam reveals sharp disc margins. Pupils equal, briskly reactive to light. Extraocular movements full without nystagmus. Visual fields full to confrontation.  Blurry vision peripheral left eye.  Hearing intact.  Decreased left-sided facial sensation.  Right-sided facial droop (chronic from prior infarct) Motor: Normal bulk and tone. Normal strength in all tested extremity muscles except for mild right hemiparesis from prior infarct along with pain from recent knee surgery Sensory.:  Decreased sensation left upper and lower extremity compared to right upper and lower extremity Coordination: Rapid alternating movements normal in all extremities. Finger-to-nose and heel-to-shin performed accurately bilaterally. Gait and Station: Arises from chair with moderate difficulty but able to do this independently. Stance is normal. Gait demonstrates favoring of right leg due to pain with use of Rollator walker. Reflexes: 1+ and symmetric. Toes downgoing.       ASSESSMENT: 62 year old lady with episode of complicated migraine in January 2019 followed now by chronic daily   headaches with mixed migraine and tension headache previously stable with use of Topamax and Zanaflex.  Vascular risk factors of hyperlipidemia only.  Chronic lower extremity deep vein thrombosis for which she is on eliquis.  Patient returns today for scheduled follow-up appointment and does state multiple daily headaches since knee surgery and 01/2018 along with worsening memory.  Upon exam, found to have blurry vision left periphery along with decreased sensation left upper and lower extremity and painful palpation over left temporal artery.  Differential diagnosis complicated migraine vs temporal arteritis vs acute/subacute infarct since the  surgery    PLAN: -Continue Eliquis (apixaban) daily  and atorvastatin 80 mg for secondary stroke prevention and DVT management -We will check sed rate and CRP to rule out possible temporal arteritis along with initiating high-dose steroid with prednisone 60 mg daily -MRI brain wo contrast to rule out possible new infarct -If all negative, will consider adding additional migraine management due to daily headaches -Continue Topamax 50 mg twice daily and Zanaflex 2 mg twice daily -Can consider evaluation with ST for cognitive concerns -Advised to continue PT for continued right knee rehab -F/u with PCP regarding your HLD and HTN management -continue to monitor BP at home -Maintain strict control of hypertension with blood pressure goal below 130/90, diabetes with hemoglobin A1c goal below 6.5% and cholesterol with LDL cholesterol (bad cholesterol) goal below 70 mg/dL. I also advised the patient to eat a healthy diet with plenty of whole grains, cereals, fruits and vegetables, exercise regularly and maintain ideal body weight.  Follow up in 2 months or call earlier if needed   Greater than 50% of time during this 25 minute visit was spent on counseling,explanation of diagnosis mixed migraine and tension headache, planning of further management, discussion with patient and family and coordination of care  Venancio Poisson, [facility]  [facility] [address] [place], [place] [address]  Phone [phone] Fax [phone] Note: This document was prepared with digital dictation and possible smart phrase technology. Any transcriptional errors that result from this process are unintentional.

## 2018-05-14 NOTE — Progress Notes (Signed)
I agree with the above plan 

## 2018-05-14 NOTE — Progress Notes (Signed)
I agree with plan as stated

## 2018-05-15 ENCOUNTER — Telehealth: Payer: Self-pay

## 2018-05-15 ENCOUNTER — Telehealth: Payer: Self-pay | Admitting: Adult Health

## 2018-05-15 LAB — SEDIMENTATION RATE: SED RATE: 11 mm/h (ref 0–40)

## 2018-05-15 LAB — C-REACTIVE PROTEIN: CRP: 7 mg/L (ref 0–10)

## 2018-05-15 MED ORDER — NORTRIPTYLINE HCL 10 MG PO CAPS: 10.0000 mg | ORAL_CAPSULE | Freq: Every day | ORAL | 3 refills | Status: DC

## 2018-05-15 NOTE — Telephone Encounter (Signed)
Order placed to start nortriptyline 10 mg daily due to continued headaches.

## 2018-05-15 NOTE — Telephone Encounter (Signed)
-----   Message from Venancio Poisson, NP sent at 05/15/2018 12:05 PM EDT ----- Please advise patient that as sed rate and CRP came back normal, she most likely does not have temporal arteritis.  At this time please notify her to stop prednisone.  No need to taper as she started this yesterday.  We can consider adding additional medication to help control her migraines.  She is interested please let me know in order will be placed.

## 2018-05-15 NOTE — Telephone Encounter (Signed)
lvm for pt to call back about scheduling mri  [employer] Auth: [id number] (exp. 05/15/18 to 06/13/18)

## 2018-05-15 NOTE — Telephone Encounter (Signed)
Rn call patient about her lab work and to stop the prednisone medication. Rn stated that her sed rate and CRP came back normal, she most likely does not have temporal arteritis. Anita Billow NP wants her to stop prednisone. No need to taper as she started this yesterday. Anita Billow NP can consider adding additional medication to help control her migraines. Anita Benton wants to know if she is interested please let me know in order will be placed. PT stated she would like to start another medication for her headaches. ------

## 2018-05-15 NOTE — Telephone Encounter (Signed)
Rn call patient about Janett Billow NP order nortriptyline at [facility].Rn stated it was sent to her pharmacy listed. PT verbalized understanding.

## 2018-05-15 NOTE — Addendum Note (Signed)
Addended by: Venancio Poisson on: 05/15/2018 01:33 PM   Modules accepted: Orders

## 2018-06-05 ENCOUNTER — Other Ambulatory Visit: Payer: Self-pay | Admitting: Adult Health

## 2018-06-05 DIAGNOSIS — M316 Other giant cell arteritis: Secondary | ICD-10-CM

## 2018-06-07 ENCOUNTER — Other Ambulatory Visit: Payer: Self-pay | Admitting: Adult Health

## 2018-06-10 ENCOUNTER — Other Ambulatory Visit: Payer: Self-pay

## 2018-06-10 MED ORDER — NORTRIPTYLINE HCL 10 MG PO CAPS: 10.0000 mg | ORAL_CAPSULE | Freq: Every day | ORAL | 0 refills | Status: DC

## 2018-06-12 ENCOUNTER — Other Ambulatory Visit: Payer: Self-pay | Admitting: Hematology and Oncology

## 2018-07-15 ENCOUNTER — Encounter: Payer: Self-pay | Admitting: Adult Health

## 2018-07-15 ENCOUNTER — Ambulatory Visit: Payer: BLUE CROSS/BLUE SHIELD | Admitting: Adult Health

## 2018-07-15 VITALS — BP 110/75 | HR 70 | Ht 64.0 in | Wt 203.8 lb

## 2018-07-15 DIAGNOSIS — F329 Major depressive disorder, single episode, unspecified: Secondary | ICD-10-CM | POA: Diagnosis not present

## 2018-07-15 DIAGNOSIS — R519 Headache, unspecified: Secondary | ICD-10-CM

## 2018-07-15 DIAGNOSIS — R51 Headache: Secondary | ICD-10-CM | POA: Diagnosis not present

## 2018-07-15 DIAGNOSIS — R5383 Other fatigue: Secondary | ICD-10-CM

## 2018-07-15 MED ORDER — ESCITALOPRAM OXALATE 20 MG PO TABS: 20.0000 mg | ORAL_TABLET | Freq: Every day | ORAL | 4 refills | Status: DC

## 2018-07-15 MED ORDER — NORTRIPTYLINE HCL 25 MG PO CAPS: 25.0000 mg | ORAL_CAPSULE | Freq: Every day | ORAL | 4 refills | Status: DC

## 2018-07-15 NOTE — Patient Instructions (Addendum)
Your Plan:  Continue gabapentin 300mg  three times daily, tizanidine , and topamax  Increase nortriptyline to 25mg  nightly for continued headaches  Increase lexapro to 20mg  nightly for continued depression  You will be called by our sleep providers to schedule appointment to rule out possible sleep apnea or underlying sleep disorder     Follow up in 3 months or call earlier if needed     Thank you for coming to see [place] at [facility]. I hope we have been able to provide you high quality care today.  You may receive a patient satisfaction survey over the next few weeks. We would appreciate your feedback and comments so that we may continue to improve ourselves and the health of our patients.

## 2018-07-15 NOTE — Progress Notes (Signed)
[facility] [address] [place]. [place] [address] [phone]       OFFICE FOLLOW-UP NOTE  Ms. Jolaine Fryberger Date of Birth:  1956-04-09 Medical Record Number:  [id number]   Chief Complaint  Patient presents with  . Follow-up    Follow up from Neck pain, temporal arterites room 9 pt alone pt has a walker pt seeing new ortho med with her right leg     HPI: initial visit 09/24/17 :Ms. Scobee is a 62 year old lady seen today for the first office follow-up visit following hospital admission for TIA versus computed migraine in January 2019. History is obtained from the patient and review of electronic medical records. I have personally reviewed imaging films.Makylie Rivere is an 62 y.o. female  PMH of DVT- R leg 05/2017 on Eliquis, Hx of CVA x 2, HLD, Asthma, Obesity, recent history of Shingles presents to outside hospital on 08/04/2017 with complaints of left sided numbness. Dr Cheral Marker was contacted by phone and agreed to have the patient transferred to [facility] for stroke evaluation. Initial Head CT negative for acute findings.Her initial symptoms at [facility] were Left facial numbness, slow speech and Left hand weakness. On exam this evening at [facility] patient is complaining of Left sided numbness on her entire body, worse on her face. She is complaining of a Left sided H/A 8/10 with photophobia, blurred vision or nausea. She has not asked for pain medication. She is complaining of Left sided hand weakness. No dysarthria or slow speech noted on exam. She is also complaining of being "itchy" all over her body. No active shingles lesions noted on face or body. Date last known well: Date: 08/04/2017 Time last known well: Time: 21:30 tPA Given: No: outside the window.Modified Rankin: Rankin Score=0. MRI scan of the brain showed no acute abnormality. There was small vessel disease changes which were progressed compared to previous MRI from 2013. MRI of the  brain showed no significant intracranial stenosis. Carotid ultrasound and transthoracic echo were unremarkable. Patient was already on eliquis due to recent deep vein thrombosis which was continued. Patient states she's done well since discharge has not had any recurrent TIA or stroke symptoms however she's been having frequent headaches. These headaches began with this episode and have not resolved over the last month and a half She in fact describes now headache occurring on a daily basis and continuous. The headache is mostly on the left side she describes this as a constant 10/10 headache which is mostly aching in character. Headache appears to be diminished in sleep. She is able to work moredue to that despite the headache. The headache is accompanied by nausea and occasional vomiting light sensitivity. She does feel better if she lies down and goes to sleep. She's tried taking Tylenol and Excedrin without relief in her primary care physician as recently prescribed to a muscle relaxant #1 it tone twice daily which is also not helping. She does complain of neck pain and spasm and tightness and has trouble moving the neck in either direction. She does have remote history of cluster headaches   but denies migraine headache history  Update 12/04/2017 ; she returns for follow-up after last visit 2 months ago. She reports that her headaches are significantly better and in fact she cannot remember the last time she had one. She is tolerating Topamax 25 mg twice daily quite well without any side effects. She states she is still bothered by neck pain which he describes of  the base of the neck on the left side. It is intermittent and sharp. She has not tried local heat application. She does take Zanaflex 2 mg at night which seems to help her sleep well. She has not tried a daytime dose yet. She seems frustrated that despite 6 weeks of eliquis she still has persistent DVT in her leg and would need to take eliquis for  longer period of time. She has no new complaints today.  05/14/2018 visit: Patient returns today for follow-up visit.  Since prior office visit, patient underwent right knee surgery and since this time states she has experienced increased frequency of headaches occurring approximately 3 times daily with a pressure sensation which are accompanied by photophobia, phonophobia and nausea.  She does endorse compliance with Topamax 50 mg twice daily along with Zanaflex 2 mg twice daily.  these are debilitating as she does need to lay down when they occur but denies any change in vision.  Prior migraines also occurred on the left side but more into her neck with these are mainly located in her temporal area.  She also states since this procedure her memory has been worse especially with short-term information and states her boyfriend gets frustrated with her as she has difficulty remembering certain things.  She continues on Eliquis for chronic DVT management along with Lipitor for HLD and secondary stroke prevention.  She does endorse pain in her right knee where she continues PT and is frustrated with her lack of progress.  No further concerns at this time.  Upon assessment, patient was found to have decreased sensation on left face, left arm and left leg which she has been seen in the ED for previously but per patient, have resolved at that time.  She also endorses sensitivity in left temporal area upon palpitation.  Was found to have blurry peripheral vision in left eye only which patient has declined noticing prior to this exam.  Interval history 07/15/2018: Patient returns today for 16-month follow-up visit.  After prior visit, sed rate and CRP obtained to rule out potential temporal arteritis which was negative and prednisone discontinued at that time.  Nortriptyline was initiated to assist with continued headaches. She states her migraines were improving with only a few times weekly. She states on Sunday she  started to experience a worsening migraine that has not resolved. She does endorse noise sensitivity but denies light sensitivity or N/V. She does endorse daytime fatigue and insomnia at night. She feels constantly fatigued during the day but unable to sleep at night or nap during the day.  Patient does endorse underlying depression and at times will become tearful as she is frustrated with her continued knee pain and back pain which greatly limits her mobility and activity.  No further concerns at this time.   ROS:   14 system review of systems is positive for fatigue, ear pain, runny nose, leg swelling, restless leg, insomnia, joint pain, joint swelling, back pain, walking difficulty, memory loss, dizziness and headache and all other systems negative  PMH:  Past Medical History:  Diagnosis Date  . Allergy   . Arthritis    "ankles; right hip"  . GERD (gastroesophageal reflux disease)   . Hyperlipidemia   . Itching   . Keloid    "q where; I have keloid skin"  . Menopause   . Migraines 04/01/2012  . Right leg DVT ([facility]) 1980's  . Shingles 04/01/2012   "have them q year; usually in Nov; for  the last 20 years"  . Shortness of breath 04/01/2012   "when my asthma acts up"  . Stroke [facility]) 1977; 04/01/2012   denies residual; "from BCP RX"; little weak right arm; right face little numb"  . Vitamin D deficiency     Social History:  Social History   Socioeconomic History  . Marital status: Divorced    Spouse name: Not on file  . Number of children: Not on file  . Years of education: Not on file  . Highest education level: Not on file  Occupational History  . Not on file  Social Needs  . Financial resource strain: Not on file  . Food insecurity:    Worry: Not on file    Inability: Not on file  . Transportation needs:    Medical: Not on file    Non-medical: Not on file  Tobacco Use  . Smoking status: Never Smoker  . Smokeless tobacco: Never Used  Substance and Sexual Activity  .  Alcohol use: No  . Drug use: No  . Sexual activity: Not on file  Lifestyle  . Physical activity:    Days per week: Not on file    Minutes per session: Not on file  . Stress: Not on file  Relationships  . Social connections:    Talks on phone: Not on file    Gets together: Not on file    Attends religious service: Not on file    Active member of club or organization: Not on file    Attends meetings of clubs or organizations: Not on file    Relationship status: Not on file  . Intimate partner violence:    Fear of current or ex partner: Not on file    Emotionally abused: Not on file    Physically abused: Not on file    Forced sexual activity: Not on file  Other Topics Concern  . Not on file  Social History Narrative  . Not on file    Medications:   Current Outpatient Medications on File Prior to Visit  Medication Sig Dispense Refill  . albuterol (ACCUNEB) 1.25 MG/3ML nebulizer solution Take 1 ampule by nebulization every 6 (six) hours as needed for wheezing.    Marland Kitchen atorvastatin (LIPITOR) 10 MG tablet Take 1 tablet (10 mg total) by mouth daily. 90 tablet 1  . Cholecalciferol (VITAMIN D-1000 MAX ST) 1000 units tablet Take 1,000 Units by mouth daily.    Marland Kitchen ELIQUIS 5 MG TABS tablet TAKE 1 TABLET BY MOUTH TWICE A DAY 60 tablet 2  . famotidine (PEPCID) 20 MG tablet TAKE 1 TABLET BY MOUTH AT BEDTIME (Patient taking differently: TAKE 1 TABLET BY MOUTH AS NEEDED FOR GERD) 90 tablet 0  . gabapentin (NEURONTIN) 300 MG capsule gabapentin 300 mg capsule  TAKE 1 CAPSULE(S) 3 TIMES A DAY BY ORAL ROUTE AS DIRECTED.    Marland Kitchen montelukast (SINGULAIR) 10 MG tablet Take 1 tablet (10 mg total) by mouth at bedtime. 90 tablet 1  . MULTIPLE VITAMIN PO Take 1 tablet by mouth.    . pantoprazole (PROTONIX) 40 MG tablet TAKE 1 TABLET (40 MG TOTAL) BY MOUTH DAILY. TAKE 30-60 MIN BEFORE FIRST MEAL OF THE DAY 90 tablet 0  . pregabalin (LYRICA) 75 MG capsule Take by mouth.    . tizanidine (ZANAFLEX) 2 MG capsule TAKE  1 CAPSULE BY MOUTH AT BEDTIME. 30 capsule 6  . topiramate (TOPAMAX) 50 MG tablet TAKE 1 TABLET BY MOUTH TWICE A DAY 180 tablet 1  No current facility-administered medications on file prior to visit.     Allergies:   Allergies  Allergen Reactions  . Codeine Rash  . Levofloxacin Itching and Swelling    Lip swelling  . Rice Shortness Of Breath and Rash    Physical Exam General: well developed, well nourished middle-aged lady, seated, in no evident distress Head: head normocephalic and atraumatic.  Neck: supple with no carotid or supraclavicular bruits Cardiovascular: regular rate and rhythm, no murmurs Musculoskeletal: no deformity  Skin:  no rash/petichiae Vascular:  Normal pulses all extremities; unable to adequately palpate left temporal pulse due to increased pain Vitals:   07/15/18 1425  BP: 110/75  Pulse: 70   Neurologic Exam Mental Status: Awake and fully alert. Oriented to place and time. Recent and remote memory intact. Attention span, concentration and fund of knowledge appropriate. Mood and affect appropriate.  Cranial Nerves: Fundoscopic exam reveals sharp disc margins. Pupils equal, briskly reactive to light. Extraocular movements full without nystagmus. Visual fields full to confrontation.  Hearing intact. Right-sided facial droop (chronic from prior infarct) Motor: Normal bulk and tone. Normal strength in all tested extremity muscles except for mild right hemiparesis from prior infarct along with pain from recent knee surgery Sensory.:  Decreased sensation left upper and lower extremity compared to right upper and lower extremity Coordination: Rapid alternating movements normal in all extremities. Finger-to-nose and heel-to-shin performed accurately bilaterally. Gait and Station: Arises from chair with moderate difficulty but able to do this independently. Stance is normal. Gait demonstrates favoring of right leg due to pain with use of Rollator walker. Reflexes: 1+  and symmetric. Toes downgoing.       ASSESSMENT: 62 year old lady with episode of complicated migraine in January 2019 followed now by chronic daily   headaches with mixed migraine and tension headache previously stable with use of Topamax and Zanaflex.  Vascular risk factors of hyperlipidemia only.  Chronic lower extremity deep vein thrombosis for which she is on eliquis.  Patient returns today for scheduled follow-up appointment and does state multiple daily headaches since knee surgery in 01/2018 along with worsening memory.  Patient does state migraine improvement since initiating nortriptyline with migraines occurring approximately 3 times weekly.    PLAN: -Continue Eliquis (apixaban) daily  and atorvastatin 80 mg for secondary stroke prevention and DVT management -Increase nortriptyline 25 mg nightly -Increase Lexapro to 20 mg nightly -Referral placed to [place] sleep clinic to assess for possible sleep apnea or underlying sleep disorder causing continued headaches, daytime fatigue and insomnia -Continue Topamax 50 mg twice daily and Zanaflex 2 mg twice daily -F/u with PCP regarding your HLD and HTN management -continue to monitor BP at home -Maintain strict control of hypertension with blood pressure goal below 130/90, diabetes with hemoglobin A1c goal below 6.5% and cholesterol with LDL cholesterol (bad cholesterol) goal below 70 mg/dL. I also advised the patient to eat a healthy diet with plenty of whole grains, cereals, fruits and vegetables, exercise regularly and maintain ideal body weight.  Follow up in 3 months or call earlier if needed   Greater than 50% of time during this 25 minute visit was spent on counseling,explanation of diagnosis mixed migraine and tension headache, planning of further management, discussion with patient and family and coordination of care  Venancio Poisson, AGNP-BC  [facility] [address] [place], [place]  [address]  Phone [phone] Fax [phone] Note: This document was prepared with digital dictation and possible smart phrase technology. Any transcriptional errors that  result from this process are unintentional.

## 2018-07-16 NOTE — Progress Notes (Signed)
I agree with the above plan 

## 2018-08-05 ENCOUNTER — Inpatient Hospital Stay: Payer: BLUE CROSS/BLUE SHIELD | Attending: Hematology and Oncology | Admitting: Hematology and Oncology

## 2018-08-05 NOTE — Assessment & Plan Note (Addendum)
Currently on Eliquis both for DVT treatment as well as for stroke prevention as recommended by neurology. Continue with Eliquis at this time. Right knee pain:  I encouraged her to continue to follow-up with neurology to determine the duration of anticoagulation. She has completed the anticoagulation requirements for prior DVT.  Abdominal lymphadenopathy: PET/CT scan 11/27/2017: Near complete resolution of inflammatory process in the central mesentery. Favor benign self-limiting process such as sclerosing mesenteritis. No evidence of lymphoma.  Return to clinic on an as-needed basis

## 2018-08-07 ENCOUNTER — Other Ambulatory Visit: Payer: Self-pay | Admitting: Adult Health

## 2018-08-07 DIAGNOSIS — G43411 Hemiplegic migraine, intractable, with status migrainosus: Secondary | ICD-10-CM

## 2018-08-07 DIAGNOSIS — F329 Major depressive disorder, single episode, unspecified: Secondary | ICD-10-CM

## 2018-08-07 NOTE — Telephone Encounter (Signed)
Hey wanted to know if you wanted to continue refills on lexapro. The rx has a comment stating discontinue all previous refills for this medication. But last note states to continue. Please advise if you want to refill. Thanks

## 2018-08-12 IMAGING — CR DG CHEST 2V
2 series · 2 of 2 positions shown · non-contrast
Comparison: 09/12/2017

CLINICAL DATA: Cough and fever

EXAM:
CHEST - 2 VIEW

[w chest pa]
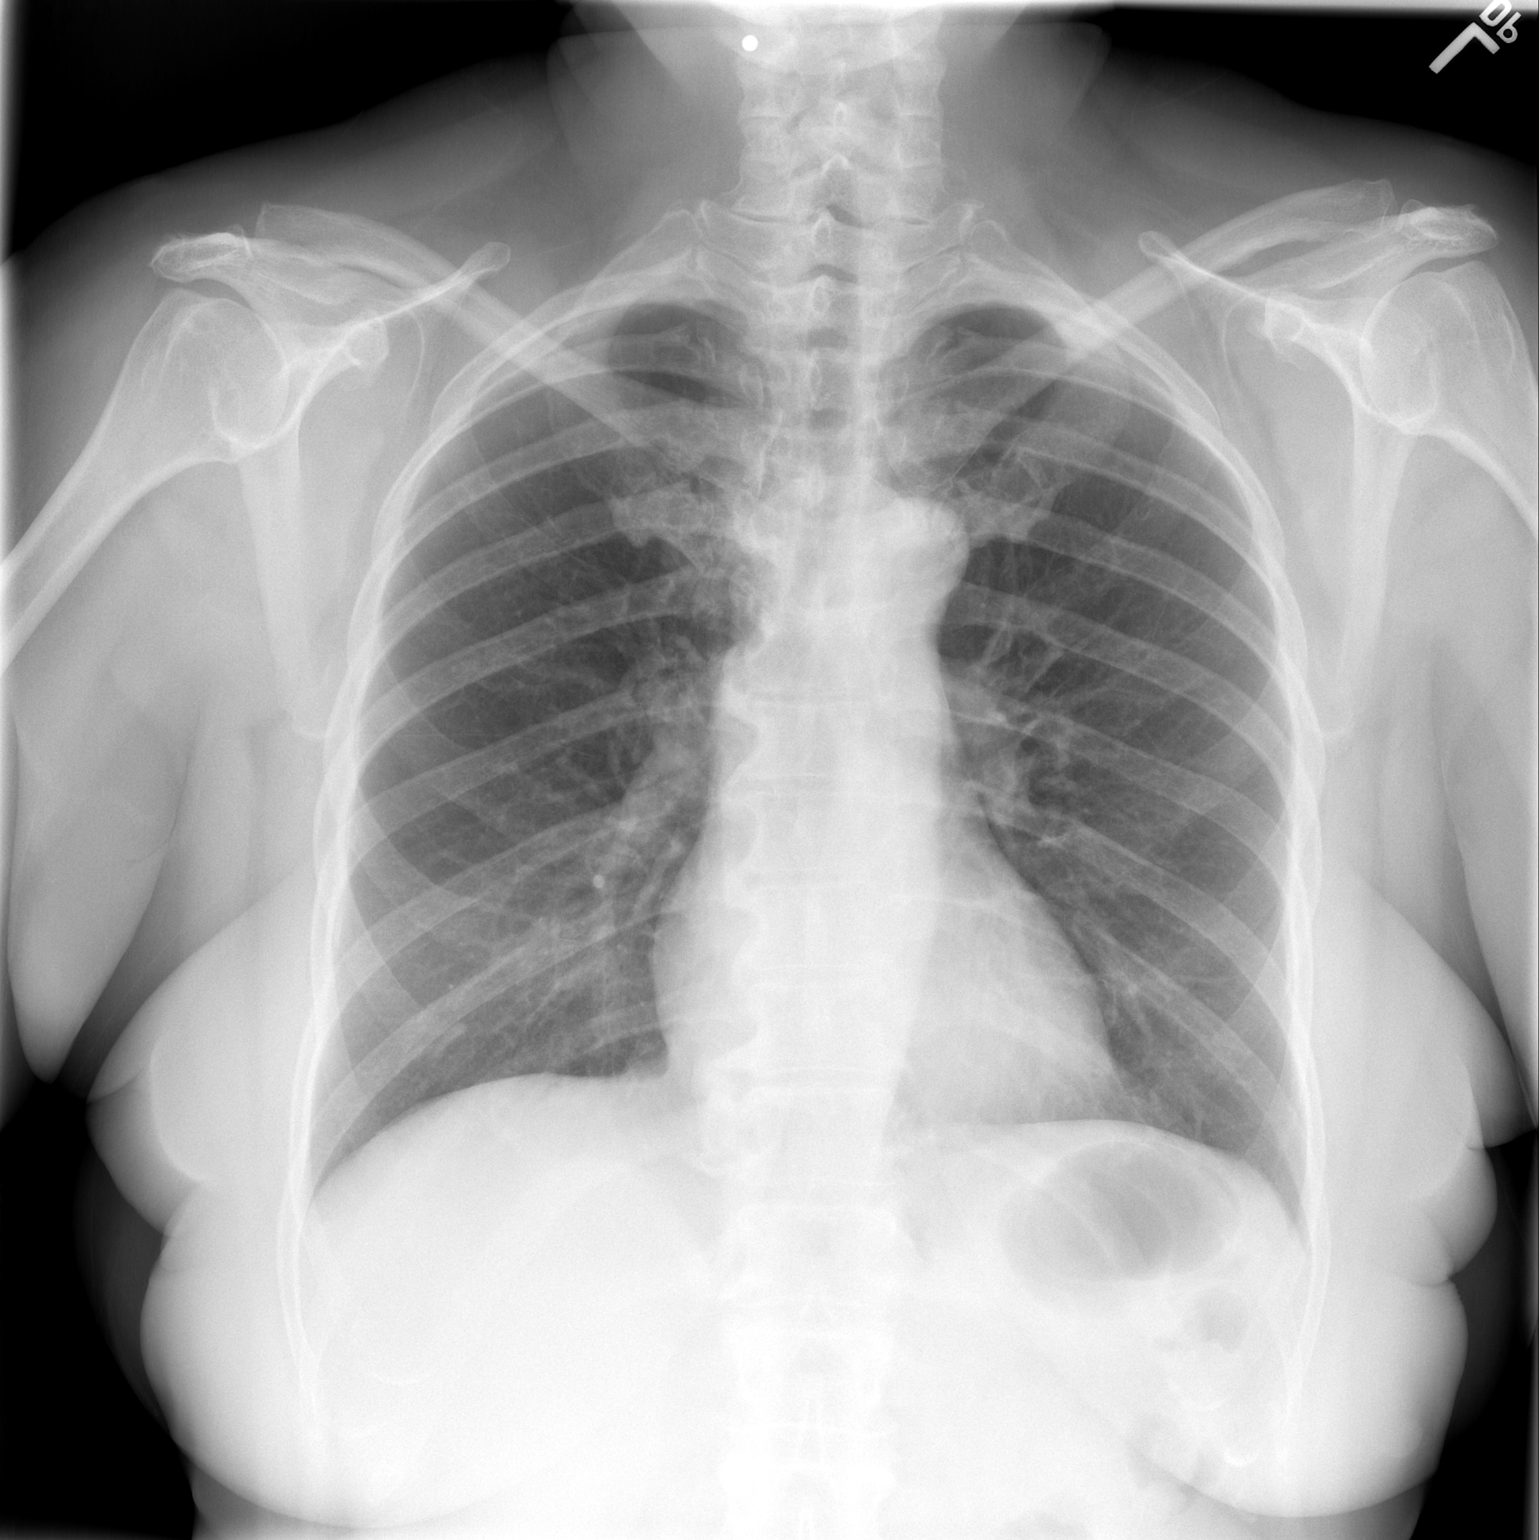

[w chest lat]
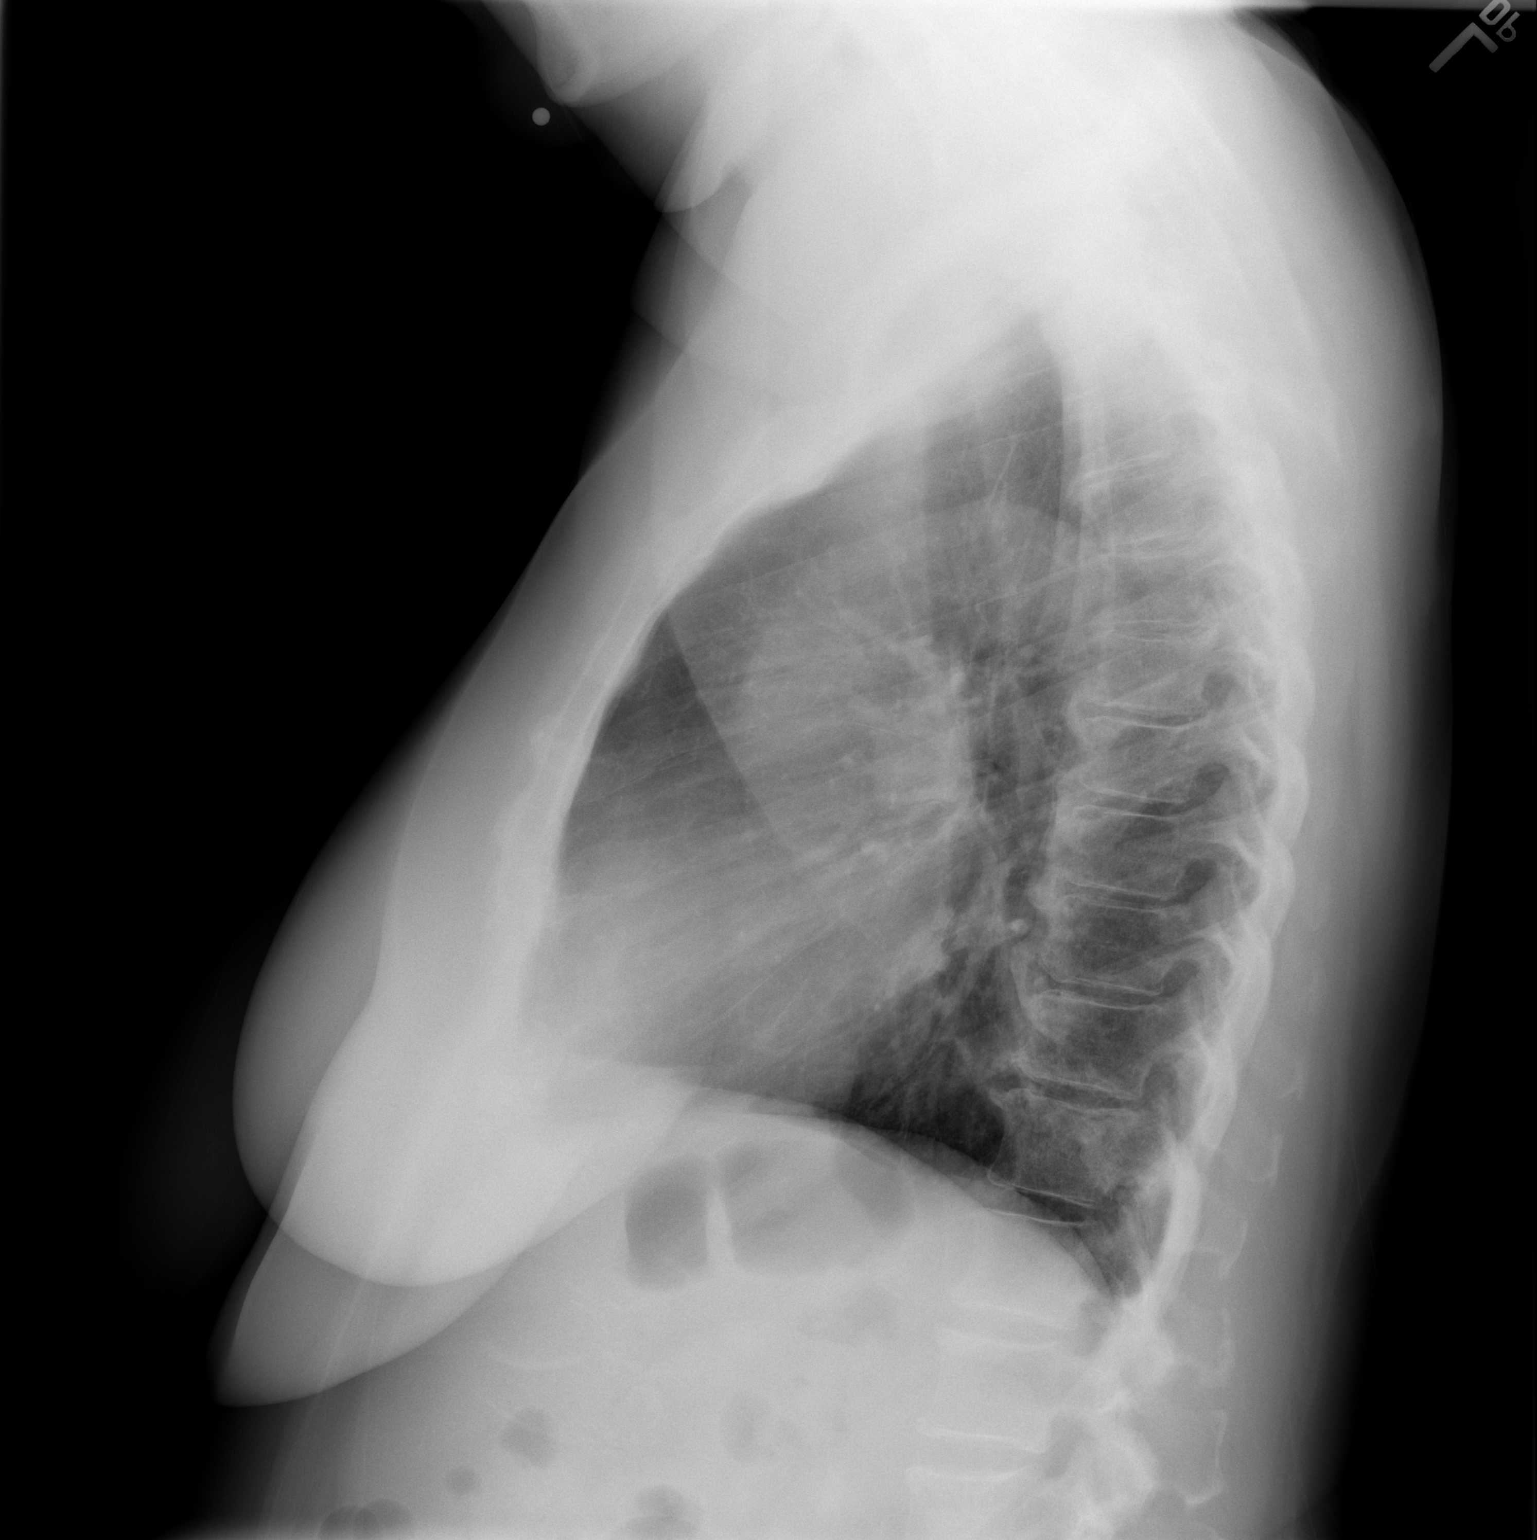

[2 of 2 positions shown; findings below may reference images not displayed]

FINDINGS: The heart size and mediastinal contours are within normal limits.
Both lungs are clear. The visualized skeletal structures are
unremarkable.
IMPRESSION: No active cardiopulmonary disease.

## 2018-09-03 ENCOUNTER — Other Ambulatory Visit: Payer: Self-pay | Admitting: Adult Health

## 2018-09-04 ENCOUNTER — Telehealth: Payer: Self-pay | Admitting: Neurology

## 2018-09-04 ENCOUNTER — Institutional Professional Consult (permissible substitution): Payer: BLUE CROSS/BLUE SHIELD | Admitting: Neurology

## 2018-09-04 NOTE — Telephone Encounter (Signed)
Patient came today for apt. During check in with Hinton Dyer the patient states that her insurance was out of network and she will need to go to someone in the network with her insurance. Patient left without being seen today for sleep consult.

## 2018-09-05 ENCOUNTER — Encounter: Payer: Self-pay | Admitting: Neurology

## 2018-09-16 ENCOUNTER — Encounter: Payer: Self-pay | Admitting: Adult Health

## 2018-09-23 ENCOUNTER — Other Ambulatory Visit: Payer: Self-pay | Admitting: Hematology and Oncology

## 2018-10-14 ENCOUNTER — Ambulatory Visit: Payer: BLUE CROSS/BLUE SHIELD | Admitting: Adult Health

## 2018-11-02 ENCOUNTER — Other Ambulatory Visit: Payer: Self-pay | Admitting: Neurology

## 2019-01-28 ENCOUNTER — Other Ambulatory Visit: Payer: Self-pay | Admitting: Neurology

## 2019-05-03 ENCOUNTER — Other Ambulatory Visit: Payer: Self-pay | Admitting: Adult Health

## 2019-05-03 DIAGNOSIS — F329 Major depressive disorder, single episode, unspecified: Secondary | ICD-10-CM

## 2019-05-03 DIAGNOSIS — G43411 Hemiplegic migraine, intractable, with status migrainosus: Secondary | ICD-10-CM

## 2021-02-14 ENCOUNTER — Ambulatory Visit (HOSPITAL_COMMUNITY): Admit: 2021-02-14 | Payer: Medicare Other

## 2021-02-14 ENCOUNTER — Emergency Department (HOSPITAL_COMMUNITY): Payer: Medicare Other

## 2021-02-14 ENCOUNTER — Other Ambulatory Visit: Payer: Self-pay

## 2021-02-14 ENCOUNTER — Encounter (HOSPITAL_BASED_OUTPATIENT_CLINIC_OR_DEPARTMENT_OTHER): Payer: Self-pay

## 2021-02-14 ENCOUNTER — Emergency Department (HOSPITAL_BASED_OUTPATIENT_CLINIC_OR_DEPARTMENT_OTHER): Payer: Medicare Other

## 2021-02-14 ENCOUNTER — Emergency Department (HOSPITAL_BASED_OUTPATIENT_CLINIC_OR_DEPARTMENT_OTHER)
Admission: EM | Admit: 2021-02-14 | Discharge: 2021-02-15 | Disposition: A | Discharge status: Home / Self Care | Payer: Medicare Other | Attending: Emergency Medicine | Admitting: Emergency Medicine

## 2021-02-14 DIAGNOSIS — J45991 Cough variant asthma: Secondary | ICD-10-CM | POA: Insufficient documentation

## 2021-02-14 DIAGNOSIS — R519 Headache, unspecified: Secondary | ICD-10-CM | POA: Diagnosis not present

## 2021-02-14 DIAGNOSIS — Z79899 Other long term (current) drug therapy: Secondary | ICD-10-CM | POA: Diagnosis not present

## 2021-02-14 DIAGNOSIS — Z7901 Long term (current) use of anticoagulants: Secondary | ICD-10-CM | POA: Insufficient documentation

## 2021-02-14 DIAGNOSIS — Z96641 Presence of right artificial hip joint: Secondary | ICD-10-CM | POA: Insufficient documentation

## 2021-02-14 DIAGNOSIS — R2681 Unsteadiness on feet: Secondary | ICD-10-CM

## 2021-02-14 DIAGNOSIS — R42 Dizziness and giddiness: Secondary | ICD-10-CM

## 2021-02-14 DIAGNOSIS — I10 Essential (primary) hypertension: Secondary | ICD-10-CM | POA: Insufficient documentation

## 2021-02-14 DIAGNOSIS — R030 Elevated blood-pressure reading, without diagnosis of hypertension: Secondary | ICD-10-CM

## 2021-02-14 LAB — CBC
HCT: 45 % (ref 36.0–46.0)
Hemoglobin: 15.5 g/dL — ABNORMAL HIGH (ref 12.0–15.0)
MCH: 30.6 pg (ref 26.0–34.0)
MCHC: 34.4 g/dL (ref 30.0–36.0)
MCV: 88.9 fL (ref 80.0–100.0)
Platelets: 269 10*3/uL (ref 150–400)
RBC: 5.06 MIL/uL (ref 3.87–5.11)
RDW: 13.7 % (ref 11.5–15.5)
WBC: 9.1 10*3/uL (ref 4.0–10.5)
nRBC: 0 % (ref 0.0–0.2)

## 2021-02-14 LAB — BASIC METABOLIC PANEL
Anion gap: 8 (ref 5–15)
BUN: 15 mg/dL (ref 8–23)
CO2: 24 mmol/L (ref 22–32)
Calcium: 9.1 mg/dL (ref 8.9–10.3)
Chloride: 109 mmol/L (ref 98–111)
Creatinine, Ser: 0.83 mg/dL (ref 0.44–1.00)
GFR, Estimated: >60 mL/min (ref >60)
Glucose, Bld: 98 mg/dL (ref 70–99)
Potassium: 3.6 mmol/L (ref 3.5–5.1)
Sodium: 141 mmol/L (ref 135–145)

## 2021-02-14 MED ORDER — KETOROLAC TROMETHAMINE 30 MG/ML IJ SOLN
15.0000 mg | Freq: Once | INTRAMUSCULAR | Status: AC
  Administered 2021-02-14: 15 mg via INTRAVENOUS
  Filled 2021-02-14: qty 1

## 2021-02-14 MED ORDER — MECLIZINE HCL 25 MG PO TABS
12.5000 mg | ORAL_TABLET | Freq: Once | ORAL | Status: AC
  Administered 2021-02-14: 12.5 mg via ORAL
  Filled 2021-02-14: qty 1

## 2021-02-14 MED ORDER — DIPHENHYDRAMINE HCL 25 MG PO CAPS
25.0000 mg | ORAL_CAPSULE | Freq: Once | ORAL | Status: AC
  Administered 2021-02-14: 25 mg via ORAL
  Filled 2021-02-14: qty 1

## 2021-02-14 MED ORDER — METOCLOPRAMIDE HCL 5 MG/ML IJ SOLN
10.0000 mg | Freq: Once | INTRAMUSCULAR | Status: AC
  Administered 2021-02-14: 10 mg via INTRAVENOUS
  Filled 2021-02-14: qty 2

## 2021-02-14 MED ORDER — ACETAMINOPHEN 500 MG PO TABS
1000.0000 mg | ORAL_TABLET | Freq: Once | ORAL | Status: AC
  Administered 2021-02-14: 1000 mg via ORAL
  Filled 2021-02-14: qty 2

## 2021-02-14 MED ORDER — TRAMADOL HCL 50 MG PO TABS
50.0000 mg | ORAL_TABLET | Freq: Once | ORAL | Status: AC
  Administered 2021-02-14: 50 mg via ORAL
  Filled 2021-02-14: qty 1

## 2021-02-14 NOTE — ED Notes (Signed)
Spoke with Edison Nasuti, MRI tech and Catalina Antigua, Manufacturing engineer.  Both are aware of the plan for this pt.  [facility] notified of need to transport pt.

## 2021-02-14 NOTE — ED Notes (Signed)
Pt requesting food, MD notified for orders

## 2021-02-14 NOTE — ED Notes (Signed)
[facility] called for [facility] for [facility]ri

## 2021-02-14 NOTE — ED Notes (Signed)
Pt ambulated with standby assistance. Pt c/o dizziness upon ambulation. Ova Freshwater, PA notified

## 2021-02-14 NOTE — Discharge Instructions (Addendum)
Please follow-up with your primary care provider as well as your neurologist.  Please take all your medications as prescribed.  You may was return to the ER for any new or concerning symptoms.  Your MRI was without any acute stroke causing her symptoms.

## 2021-02-14 NOTE — ED Notes (Signed)
Pt to MRI

## 2021-02-14 NOTE — ED Notes (Addendum)
Pt arrived from [facility]. CC HA and dizziness. Rule-out stroke. CT was negative, pt was transported to [facility] for MRI. 22G LAC. Pain 10/10.

## 2021-02-14 NOTE — ED Notes (Signed)
Pt c/o headache. Ova Freshwater, PA notified

## 2021-02-14 NOTE — ED Triage Notes (Signed)
Pt c/o HA, dizziness started yesterday-states she was seen/sent by PCP-NAD-steady gait

## 2021-02-14 NOTE — ED Provider Notes (Signed)
Accepted handoff after transfer for MRI. Please see prior provider note for more detail.   Briefly: Patient is 65 y.o.   DDX: concern for stroke  Plan: follow up on MRI - prior provider plan is DC     Physical Exam  BP 120/76   Pulse (!) 53   Temp (!) 97.5 F (36.4 C) (Oral)   Resp 18   Ht 5\' 4"  (1.626 m)   Wt 103 kg   SpO2 95%   BMI 38.96 kg/m   Physical Exam Vitals and nursing note reviewed.  Constitutional:      General: She is not in acute distress. HENT:     Head: Normocephalic and atraumatic.     Nose: Nose normal.  Eyes:     General: No scleral icterus. Cardiovascular:     Rate and Rhythm: Normal rate and regular rhythm.     Pulses: Normal pulses.     Heart sounds: Normal heart sounds.  Pulmonary:     Effort: Pulmonary effort is normal. No respiratory distress.     Breath sounds: No wheezing.  Abdominal:     Palpations: Abdomen is soft.     Tenderness: There is no abdominal tenderness.  Musculoskeletal:     Cervical back: Normal range of motion.     Right lower leg: No edema.     Left lower leg: No edema.  Skin:    General: Skin is warm and dry.     Capillary Refill: Capillary refill takes less than 2 seconds.  Neurological:     Mental Status: She is alert. Mental status is at baseline.     Comments: Moves all 4 extremities.  Strength intact in all 4 extremities.  Sensation intact in all 4 extremities.  Smile symmetric tongue protrusion midline and uvula midline.  Speaking full sentences alert and oriented x3.  Psychiatric:        Mood and Affect: Mood normal.        Behavior: Behavior normal.    ED Course/Procedures     Procedures Results for orders placed or performed during the hospital encounter of 27/78/24  Basic metabolic panel  Result Value Ref Range   Sodium 141 135 - 145 mmol/L   Potassium 3.6 3.5 - 5.1 mmol/L   Chloride 109 98 - 111 mmol/L   CO2 24 22 - 32 mmol/L   Glucose, Bld 98 70 - 99 mg/dL   BUN 15 8 - 23 mg/dL   Creatinine,  Ser 0.83 0.44 - 1.00 mg/dL   Calcium 9.1 8.9 - 10.3 mg/dL   GFR, Estimated >60 >60 mL/min   Anion gap 8 5 - 15  CBC  Result Value Ref Range   WBC 9.1 4.0 - 10.5 K/uL   RBC 5.06 3.87 - 5.11 MIL/uL   Hemoglobin 15.5 (H) 12.0 - 15.0 g/dL   HCT 45.0 36.0 - 46.0 %   MCV 88.9 80.0 - 100.0 fL   MCH 30.6 26.0 - 34.0 pg   MCHC 34.4 30.0 - 36.0 g/dL   RDW 13.7 11.5 - 15.5 %   Platelets 269 150 - 400 K/uL   nRBC 0.0 0.0 - 0.2 %   CT HEAD WO CONTRAST  Result Date: 02/14/2021 CLINICAL DATA:  Headache, intracranial hemorrhage suspected. EXAM: CT HEAD WITHOUT CONTRAST TECHNIQUE: Contiguous axial images were obtained from the base of the skull through the vertex without intravenous contrast. COMPARISON:  Head CT 06/22/2019.  Brain MRI 01/24/2019. FINDINGS: Brain: Cerebral volume is normal. Redemonstrated chronic lacunar  infarct within the right basal ganglia. Mild patchy and ill-defined hypoattenuation within the cerebral white matter, nonspecific but compatible with chronic small vessel ischemic disease. There is no acute intracranial hemorrhage. No demarcated cortical infarct. No extra-axial fluid collection. No evidence of an intracranial mass. No midline shift. Partially empty sella turcica. Vascular: No hyperdense vessel.  Atherosclerotic calcifications. Skull: Normal. Negative for fracture or focal lesion. Sinuses/Orbits: Visualized orbits show no acute finding. No significant paranasal sinus disease at the imaged levels. IMPRESSION: No evidence of acute intracranial abnormality. Redemonstrated chronic lacunar infarct within the right basal ganglia. Mild chronic small vessel ischemic changes within the cerebral white matter. Redemonstrated partially empty sella turcica. This finding is very commonly incidental, but can be associated with idiopathic intracranial hypertension. Electronically Signed   By: Kellie Simmering DO   On: 02/14/2021 15:59   MR BRAIN WO CONTRAST  Result Date: 02/14/2021 CLINICAL DATA:   Dizziness EXAM: MRI HEAD WITHOUT CONTRAST TECHNIQUE: Multiplanar, multiecho pulse sequences of the brain and surrounding structures were obtained without intravenous contrast. COMPARISON:  Brain MRI 01/24/2019 FINDINGS: Brain: No acute infarct, mass effect or extra-axial collection. No acute or chronic hemorrhage. There is multifocal hyperintense T2-weighted signal within the white matter. Parenchymal volume and CSF spaces are normal. The midline structures are normal. Vascular: Major flow voids are preserved. Skull and upper cervical spine: Normal calvarium and skull base. Visualized upper cervical spine and soft tissues are normal. Sinuses/Orbits:No paranasal sinus fluid levels or advanced mucosal thickening. No mastoid or middle ear effusion. Normal orbits. IMPRESSION: 1. No acute intracranial abnormality. 2. Multifocal hyperintense T2-weighted signal within the white matter, most consistent with chronic small vessel ischemia. Electronically Signed   By: Ulyses Jarred M.D.   On: 02/14/2021 22:00    MDM    MRI brain without contrast negative for acute stroke patient has evidence of chronic small vessel disease  BMP CBC unremarkable.  Patient is neurologically intact on my examination.  Ambulates without difficulty.  She is complaining primarily of headache at this time she was provided with 1 headache cocktail and had complete resolution of her headache.  Ambulated again without difficulty  Discharged home with neurology follow-up and PCP follow-up.  She is understanding and agreeable to plan.      Pati Gallo [place], [place] 02/15/21 0028    Tegeler, Gwenyth Allegra, MD 02/15/21 [id number]

## 2021-02-14 NOTE — ED Notes (Signed)
Patient transported to CT 

## 2021-02-14 NOTE — ED Notes (Signed)
Sandwich and refreshments provided to pt

## 2021-02-14 NOTE — ED Provider Notes (Signed)
[facility] EMERGENCY DEPARTMENT Provider Note   CSN: [id number] Arrival date & time: 02/14/21  1326     History Chief Complaint  Patient presents with   Headache    Anita Benton is a 65 y.o. female.  Patient c/o dizziness acute onset when awoke this AM, and headache which she's had for past two days. Symptoms acute onset, moderate, constant, persistent. States remote hx headaches, but in recent years rarely gets headache. Denies head trauma. Benton loc. Benton nausea/vomiting. States vision blurry earlier, Benton visual field cut or amaurosis. Denies eye pain. +sinus congestion and 'allergies' denies sinus pain or purulent drainage. Benton fever or chills. Denies neck pain/stiffness. Benton change in speech.  Benton numbness/weakness. Dizziness is described as soon of unsteadiness on feet, walking as if intoxicated but Benton etoh, needing to grap onto things to avoid falling, etc. Benton head spinning or room spinning. Benton ear pain or tinnitus. Benton lightheadedness or faintness. Pt indicates has not taken any meds for headache since onset. Went to pcp and was referred to ED for imaging.   The history is provided by the patient.  Headache Associated symptoms: Benton abdominal pain, Benton back pain, Benton fever, Benton nausea, Benton neck pain, Benton neck stiffness, Benton numbness, Benton sore throat, Benton vomiting and Benton weakness       Past Medical History:  Diagnosis Date   Allergy    Arthritis    "ankles; right hip"   GERD (gastroesophageal reflux disease)    Hyperlipidemia    Itching    Keloid    "q where; I have keloid skin"   Menopause    Migraines 04/01/2012   Right leg DVT ([place]) 1980's   Shingles 04/01/2012   "have them q year; usually in Nov; for the last 20 years"   Shortness of breath 04/01/2012   "when my asthma acts up"   Stroke ([place]) 1977; 04/01/2012   denies residual; "from BCP RX"; little weak right arm; right face little numb"   Vitamin D deficiency     Patient Active Problem List   Diagnosis Date Noted    Arthritis of right knee 01/17/2018   Lymphadenopathy, abdominal 10/11/2017   TIA (transient ischemic attack) 08/05/2017   Stroke-like symptoms    Cough variant asthma with component of UACS 02/04/2016   Edema 04/29/2013   Foot swelling 10/21/2012   Low back pain 08/09/2012   URI, acute 08/05/2012   Cough 08/05/2012   Left hip pain 08/05/2012   Zoster 07/02/2012   Right hip pain 05/23/2012   Herniated disc, cervical 04/09/2012   Headache, hemiplegic migraine 04/04/2012   Gait abnormality 04/03/2012   HTN (hypertension) 04/01/2012   Finger pain, right 04/01/2012   Finger, open wounds, complicated 83/15/1761   Varicose veins 06/27/2011   Urinary tract infection, site not specified 06/27/2011   Nevus 06/27/2011   DVT, lower extremity ([place]) 06/13/2011   Arthritis    Asthma in adult    Hyperlipidemia    Shingles    Vitamin D deficiency    Allergy    Keloid    Menopause     Past Surgical History:  Procedure Laterality Date   ABDOMINAL HYSTERECTOMY  1980's   CESAREAN SECTION  1974, 1977, 1979   FINGER SURGERY  10/09/14   righ knee arthoscopy     TOTAL HIP ARTHROPLASTY Right 09-21-15   total knee replacement on right knee     VARICOSE VEIN SURGERY  1970's   bilaterally     OB History  Gravida  4   Para  3   Term      Preterm      AB  1   Living         SAB      IAB  1   Ectopic      Multiple      Live Births              Family History  Problem Relation Age of Onset   Lung cancer Mother        smoked   Hyperlipidemia Mother    Hypertension Mother    Bone cancer Father    Breast cancer Cousin    Sudden death Neg Hx    Diabetes Neg Hx    Heart attack Neg Hx     Social History   Tobacco Use   Smoking status: Never   Smokeless tobacco: Never  Substance Use Topics   Alcohol use: Benton   Drug use: Benton    Home Medications Prior to Admission medications   Medication Sig Start Date End Date Taking? Authorizing Provider  albuterol  (ACCUNEB) 1.25 MG/3ML nebulizer solution Take 1 ampule by nebulization every 6 (six) hours as needed for wheezing.    [provider]  atorvastatin (LIPITOR) 10 MG tablet Take 1 tablet (10 mg total) by mouth daily. 02/09/15   Ann Held, DO  Cholecalciferol (VITAMIN D-1000 MAX ST) 1000 units tablet Take 1,000 Units by mouth daily.    [provider]  ELIQUIS 5 MG TABS tablet TAKE 1 TABLET BY MOUTH TWICE A DAY 09/23/18   Nicholas Lose, MD  escitalopram (LEXAPRO) 20 MG tablet TAKE 1 TABLET BY MOUTH EVERYDAY AT BEDTIME 08/08/18   Frann Rider, NP  famotidine (PEPCID) 20 MG tablet TAKE 1 TABLET BY MOUTH AT BEDTIME Patient taking differently: TAKE 1 TABLET BY MOUTH AS NEEDED FOR GERD 10/09/16   Tanda Rockers, MD  gabapentin (NEURONTIN) 300 MG capsule gabapentin 300 mg capsule  TAKE 1 CAPSULE(S) 3 TIMES A DAY BY ORAL ROUTE AS DIRECTED.    [provider]  montelukast (SINGULAIR) 10 MG tablet Take 1 tablet (10 mg total) by mouth at bedtime. 02/09/15   Roma Schanz R, DO  MULTIPLE VITAMIN PO Take 1 tablet by mouth.    [provider]  nortriptyline (PAMELOR) 25 MG capsule TAKE 1 CAPSULE (25 MG TOTAL) BY MOUTH AT BEDTIME. 08/08/18   McCue, Janett Billow, NP  pantoprazole (PROTONIX) 40 MG tablet TAKE 1 TABLET (40 MG TOTAL) BY MOUTH DAILY. TAKE 30-60 MIN BEFORE FIRST MEAL OF THE DAY 10/10/16   Tanda Rockers, MD  pregabalin (LYRICA) 75 MG capsule Take by mouth. 05/27/18   [provider]  tizanidine (ZANAFLEX) 2 MG capsule TAKE 1 CAPSULE BY MOUTH AT BEDTIME. 05/13/18   Garvin Fila, MD  topiramate (TOPAMAX) 50 MG tablet TAKE 1 TABLET BY MOUTH TWICE A DAY 01/29/19   Garvin Fila, MD    Allergies    Codeine, Levofloxacin, and Rice  Review of Systems   Review of Systems  Constitutional:  Negative for chills and fever.  HENT:  Negative for sore throat.   Eyes:  Negative for redness and visual disturbance.  Respiratory:  Negative for shortness of  breath.   Cardiovascular:  Negative for chest pain.  Gastrointestinal:  Negative for abdominal pain, nausea and vomiting.  Genitourinary:  Negative for flank pain.  Musculoskeletal:  Negative for back pain, neck pain and neck  stiffness.  Skin:  Negative for rash.  Neurological:  Positive for headaches. Negative for speech difficulty, weakness and numbness.  Hematological:  Does not bruise/bleed easily.  Psychiatric/Behavioral:  Negative for confusion.    Physical Exam Updated Vital Signs BP 136/88   Pulse 65   Temp 98.1 F (36.7 C) (Oral)   Resp 19   Ht 1.626 m (5\' 4" )   Wt 103 kg   SpO2 98%   BMI 38.96 kg/m   Physical Exam Vitals and nursing note reviewed.  Constitutional:      Appearance: Normal appearance. She is well-developed.  HENT:     Head: Atraumatic.     Comments: Benton sinus or temporal tenderness.     Right Ear: Tympanic membrane normal.     Left Ear: Tympanic membrane normal.     Nose: Nose normal.     Mouth/Throat:     Mouth: Mucous membranes are moist.  Eyes:     General: Benton scleral icterus.    Conjunctiva/sclera: Conjunctivae normal.     Pupils: Pupils are equal, round, and reactive to light.  Neck:     Vascular: Benton carotid bruit.     Trachea: Benton tracheal deviation.     Comments: Benton stiffness or rigidity.  Cardiovascular:     Rate and Rhythm: Normal rate and regular rhythm.     Pulses: Normal pulses.     Heart sounds: Normal heart sounds. Benton murmur heard.   Benton friction rub. Benton gallop.  Pulmonary:     Effort: Pulmonary effort is normal. Benton respiratory distress.     Breath sounds: Normal breath sounds.  Abdominal:     General: Bowel sounds are normal. There is Benton distension.     Palpations: Abdomen is soft.     Tenderness: There is Benton abdominal tenderness. There is Benton guarding.  Genitourinary:    Comments: Benton cva tenderness.  Musculoskeletal:        General: Benton swelling or tenderness.     Cervical back: Normal range of motion and neck supple. Benton  rigidity. Benton muscular tenderness.  Skin:    General: Skin is warm and dry.     Findings: Benton rash.  Neurological:     Mental Status: She is alert.     Comments: Alert, speech normal. Benton dysarthria or aphasia. Benton nystagmus. Motor intact bil, stre 5/5. Benton pronator drift. Sens grossly intact bil. Gait is very unsteady, pt tending to lean and fall forwards and to side, constant need to grab onto things to avoid falling.   Psychiatric:        Mood and Affect: Mood normal.    ED Results / Procedures / Treatments   Labs (all labs ordered are listed, but only abnormal results are displayed) Results for orders placed or performed during the hospital encounter of 77/82/42  Basic metabolic panel  Result Value Ref Range   Sodium 141 135 - 145 mmol/L   Potassium 3.6 3.5 - 5.1 mmol/L   Chloride 109 98 - 111 mmol/L   CO2 24 22 - 32 mmol/L   Glucose, Bld 98 70 - 99 mg/dL   BUN 15 8 - 23 mg/dL   Creatinine, Ser 0.83 0.44 - 1.00 mg/dL   Calcium 9.1 8.9 - 10.3 mg/dL   GFR, Estimated >60 >60 mL/min   Anion gap 8 5 - 15  CBC  Result Value Ref Range   WBC 9.1 4.0 - 10.5 K/uL   RBC 5.06 3.87 - 5.11 MIL/uL  Hemoglobin 15.5 (H) 12.0 - 15.0 g/dL   HCT 45.0 36.0 - 46.0 %   MCV 88.9 80.0 - 100.0 fL   MCH 30.6 26.0 - 34.0 pg   MCHC 34.4 30.0 - 36.0 g/dL   RDW 13.7 11.5 - 15.5 %   Platelets 269 150 - 400 K/uL   nRBC 0.0 0.0 - 0.2 %   CT HEAD WO CONTRAST  Result Date: 02/14/2021 CLINICAL DATA:  Headache, intracranial hemorrhage suspected. EXAM: CT HEAD WITHOUT CONTRAST TECHNIQUE: Contiguous axial images were obtained from the base of the skull through the vertex without intravenous contrast. COMPARISON:  Head CT 06/22/2019.  Brain MRI 01/24/2019. FINDINGS: Brain: Cerebral volume is normal. Redemonstrated chronic lacunar infarct within the right basal ganglia. Mild patchy and ill-defined hypoattenuation within the cerebral white matter, nonspecific but compatible with chronic small vessel ischemic disease.  There is Benton acute intracranial hemorrhage. Benton demarcated cortical infarct. Benton extra-axial fluid collection. Benton evidence of an intracranial mass. Benton midline shift. Partially empty sella turcica. Vascular: Benton hyperdense vessel.  Atherosclerotic calcifications. Skull: Normal. Negative for fracture or focal lesion. Sinuses/Orbits: Visualized orbits show Benton acute finding. Benton significant paranasal sinus disease at the imaged levels. IMPRESSION: Benton evidence of acute intracranial abnormality. Redemonstrated chronic lacunar infarct within the right basal ganglia. Mild chronic small vessel ischemic changes within the cerebral white matter. Redemonstrated partially empty sella turcica. This finding is very commonly incidental, but can be associated with idiopathic intracranial hypertension. Electronically Signed   By: Kellie Simmering DO   On: 02/14/2021 15:59     EKG None  Radiology CT HEAD WO CONTRAST  Result Date: 02/14/2021 CLINICAL DATA:  Headache, intracranial hemorrhage suspected. EXAM: CT HEAD WITHOUT CONTRAST TECHNIQUE: Contiguous axial images were obtained from the base of the skull through the vertex without intravenous contrast. COMPARISON:  Head CT 06/22/2019.  Brain MRI 01/24/2019. FINDINGS: Brain: Cerebral volume is normal. Redemonstrated chronic lacunar infarct within the right basal ganglia. Mild patchy and ill-defined hypoattenuation within the cerebral white matter, nonspecific but compatible with chronic small vessel ischemic disease. There is Benton acute intracranial hemorrhage. Benton demarcated cortical infarct. Benton extra-axial fluid collection. Benton evidence of an intracranial mass. Benton midline shift. Partially empty sella turcica. Vascular: Benton hyperdense vessel.  Atherosclerotic calcifications. Skull: Normal. Negative for fracture or focal lesion. Sinuses/Orbits: Visualized orbits show Benton acute finding. Benton significant paranasal sinus disease at the imaged levels. IMPRESSION: Benton evidence of acute intracranial  abnormality. Redemonstrated chronic lacunar infarct within the right basal ganglia. Mild chronic small vessel ischemic changes within the cerebral white matter. Redemonstrated partially empty sella turcica. This finding is very commonly incidental, but can be associated with idiopathic intracranial hypertension. Electronically Signed   By: Kellie Simmering DO   On: 02/14/2021 15:59    Procedures Procedures   Medications Ordered in ED Medications  meclizine (ANTIVERT) tablet 12.5 mg (has Benton administration in time range)  acetaminophen (TYLENOL) tablet 1,000 mg (has Benton administration in time range)  traMADol (ULTRAM) tablet 50 mg (has Benton administration in time range)    ED Course  I have reviewed the triage vital signs and the nursing notes.  Pertinent labs & imaging results that were available during my care of the patient were reviewed by me and considered in my medical decision making (see chart for details).    MDM Rules/Calculators/A&P                         Labs and ct  ordered.  Reviewed nursing notes and prior charts for additional history.   Labs reviewed/interpreted by me - hjgb normal.   CT reviewed/interpreted by me - Benton hem.  Antivert po, acetaminophen po, ultram po.   Pt remains 'dizzy', w grossly unsteady gait - as such, feels needs MRI r/o cva. Will transfer to [facility] for MR (as few boarders and few in [facility] at [place], and [facility] ED with 20+ boarding pts, and 40 pts in waiting room).  Discussed pt with Dr Sherry Ruffing in [facility] ED who accepts in transfer.   Plan is for transfer to [facility] for MRI.  If neg for cva, feel pt likely stable for d/c to home then, if pos, admit.   Pt is agreeable with transport to [facility] for MRI.   Final Clinical Impression(s) / ED Diagnoses Final diagnoses:  None    Rx / DC Orders ED Discharge Orders     None        Lajean Saver, MD 02/14/21 1655

## 2022-11-11 ENCOUNTER — Emergency Department (HOSPITAL_BASED_OUTPATIENT_CLINIC_OR_DEPARTMENT_OTHER)
Admission: EM | Admit: 2022-11-11 | Discharge: 2022-11-11 | Disposition: A | Discharge status: Home / Self Care | Payer: 59 | Attending: Emergency Medicine | Admitting: Emergency Medicine

## 2022-11-11 ENCOUNTER — Encounter (HOSPITAL_BASED_OUTPATIENT_CLINIC_OR_DEPARTMENT_OTHER): Payer: Self-pay | Admitting: Emergency Medicine

## 2022-11-11 ENCOUNTER — Other Ambulatory Visit: Payer: Self-pay

## 2022-11-11 ENCOUNTER — Emergency Department (HOSPITAL_BASED_OUTPATIENT_CLINIC_OR_DEPARTMENT_OTHER): Payer: 59

## 2022-11-11 DIAGNOSIS — Z8673 Personal history of transient ischemic attack (TIA), and cerebral infarction without residual deficits: Secondary | ICD-10-CM | POA: Insufficient documentation

## 2022-11-11 DIAGNOSIS — Z7901 Long term (current) use of anticoagulants: Secondary | ICD-10-CM | POA: Diagnosis not present

## 2022-11-11 DIAGNOSIS — J45901 Unspecified asthma with (acute) exacerbation: Secondary | ICD-10-CM | POA: Diagnosis not present

## 2022-11-11 DIAGNOSIS — R059 Cough, unspecified: Secondary | ICD-10-CM | POA: Diagnosis present

## 2022-11-11 DIAGNOSIS — R0602 Shortness of breath: Secondary | ICD-10-CM | POA: Insufficient documentation

## 2022-11-11 LAB — CBC WITH DIFFERENTIAL/PLATELET
Abs Immature Granulocytes: 0.03 10*3/uL (ref 0.00–0.07)
Basophils Absolute: 0 10*3/uL (ref 0.0–0.1)
Basophils Relative: 0 %
Eosinophils Absolute: 0.1 10*3/uL (ref 0.0–0.5)
Eosinophils Relative: 1 %
HCT: 41.8 % (ref 36.0–46.0)
Hemoglobin: 13.9 g/dL (ref 12.0–15.0)
Immature Granulocytes: 0 %
Lymphocytes Relative: 26 %
Lymphs Abs: 1.9 10*3/uL (ref 0.7–4.0)
MCH: 29.9 pg (ref 26.0–34.0)
MCHC: 33.3 g/dL (ref 30.0–36.0)
MCV: 89.9 fL (ref 80.0–100.0)
Monocytes Absolute: 0.8 10*3/uL (ref 0.1–1.0)
Monocytes Relative: 11 %
Neutro Abs: 4.6 10*3/uL (ref 1.7–7.7)
Neutrophils Relative %: 62 %
Platelets: 236 10*3/uL (ref 150–400)
RBC: 4.65 MIL/uL (ref 3.87–5.11)
RDW: 14.1 % (ref 11.5–15.5)
WBC: 7.4 10*3/uL (ref 4.0–10.5)
nRBC: 0 % (ref 0.0–0.2)

## 2022-11-11 LAB — BASIC METABOLIC PANEL
Anion gap: 7 (ref 5–15)
BUN: 11 mg/dL (ref 8–23)
CO2: 24 mmol/L (ref 22–32)
Calcium: 8.6 mg/dL — ABNORMAL LOW (ref 8.9–10.3)
Chloride: 109 mmol/L (ref 98–111)
Creatinine, Ser: 0.84 mg/dL (ref 0.44–1.00)
GFR, Estimated: >60 mL/min (ref >60)
Glucose, Bld: 105 mg/dL — ABNORMAL HIGH (ref 70–99)
Potassium: 3.9 mmol/L (ref 3.5–5.1)
Sodium: 140 mmol/L (ref 135–145)

## 2022-11-11 LAB — BRAIN NATRIURETIC PEPTIDE: B Natriuretic Peptide: 11.1 pg/mL (ref 0.0–100.0)

## 2022-11-11 LAB — TROPONIN I (HIGH SENSITIVITY): Troponin I (High Sensitivity): 2 ng/L (ref <18)

## 2022-11-11 MED ORDER — IPRATROPIUM-ALBUTEROL 0.5-2.5 (3) MG/3ML IN SOLN
3.0000 mL | Freq: Once | RESPIRATORY_TRACT | Status: AC
  Administered 2022-11-11: 3 mL via RESPIRATORY_TRACT
  Filled 2022-11-11: qty 3

## 2022-11-11 MED ORDER — AZITHROMYCIN 250 MG PO TABS: 250.0000 mg | ORAL_TABLET | Freq: Every day | ORAL | 0 refills | Status: AC

## 2022-11-11 MED ORDER — DEXAMETHASONE SODIUM PHOSPHATE 10 MG/ML IJ SOLN
10.0000 mg | Freq: Once | INTRAMUSCULAR | Status: AC
  Administered 2022-11-11: 10 mg via INTRAVENOUS
  Filled 2022-11-11: qty 1

## 2022-11-11 MED ORDER — IOHEXOL 350 MG/ML SOLN
80.0000 mL | Freq: Once | INTRAVENOUS | Status: AC | PRN
  Administered 2022-11-11: 80 mL via INTRAVENOUS

## 2022-11-11 NOTE — Discharge Instructions (Signed)
You were evaluated today for cough and wheezing.  Your examination is consistent with an asthma exacerbation.  Your workup was reassuring for no signs of pneumonia or pulmonary embolism.  Please continue to use your home medication regimen including albuterol.  I have prescribed an antibiotic to be taken.  Please complete the course of antibiotics.  Follow-up with your primary care provider for further evaluation and management.  If you become short of breath or develop other life-threatening symptoms please return to the emergency department.

## 2022-11-11 NOTE — ED Provider Notes (Addendum)
[facility] Provider Note   CSN: [id number] Arrival date & time: 11/11/22  8333     History  Chief Complaint  Patient presents with   Cough    Anita Benton is a 67 y.o. female.  Patient presents to the emergency room complaining of worsening cough over the last week.  Patient states she had an upper respiratory infection 2 weeks ago.  She states she felt that she was getting over this infection when she began to have an increase in her cough approximately 1 week ago.  She describes a productive cough and also complains of increased wheezing.  The patient does have a history of asthma and has been using her asthma medications and rescue inhaler as prescribed.  She denies fevers, abdominal pain, nausea, vomiting.  She does endorse "chest pain" but describes it as tenderness in the upper chest near the collarbones bilaterally.  Past medical history significant for shortness of breath, GERD, stroke, DVT, allergies  HPI     Home Medications Prior to Admission medications   Medication Sig Start Date End Date Taking? Authorizing Provider  azithromycin (ZITHROMAX) 250 MG tablet Take 1 tablet (250 mg total) by mouth daily. Take first 2 tablets together, then 1 every day until finished. 11/11/22  Yes Barrie Dunker B, PA-C  albuterol (ACCUNEB) 1.25 MG/3ML nebulizer solution Take 1 ampule by nebulization every 6 (six) hours as needed for wheezing.    [provider]  atorvastatin (LIPITOR) 10 MG tablet Take 1 tablet (10 mg total) by mouth daily. 02/09/15   Donato Schultz, DO  Cholecalciferol (VITAMIN D-1000 MAX ST) 1000 units tablet Take 1,000 Units by mouth daily.    [provider]  ELIQUIS 5 MG TABS tablet TAKE 1 TABLET BY MOUTH TWICE A DAY 09/23/18   Serena Croissant, MD  escitalopram (LEXAPRO) 20 MG tablet TAKE 1 TABLET BY MOUTH EVERYDAY AT BEDTIME 08/08/18   Ihor Austin, NP  famotidine (PEPCID) 20 MG tablet TAKE 1  TABLET BY MOUTH AT BEDTIME Patient taking differently: TAKE 1 TABLET BY MOUTH AS NEEDED FOR GERD 10/09/16   Nyoka Cowden, MD  gabapentin (NEURONTIN) 300 MG capsule gabapentin 300 mg capsule  TAKE 1 CAPSULE(S) 3 TIMES A DAY BY ORAL ROUTE AS DIRECTED.    [provider]  montelukast (SINGULAIR) 10 MG tablet Take 1 tablet (10 mg total) by mouth at bedtime. 02/09/15   Seabron Spates R, DO  MULTIPLE VITAMIN PO Take 1 tablet by mouth.    [provider]  nortriptyline (PAMELOR) 25 MG capsule TAKE 1 CAPSULE (25 MG TOTAL) BY MOUTH AT BEDTIME. 08/08/18   McCue, Shanda Bumps, NP  pantoprazole (PROTONIX) 40 MG tablet TAKE 1 TABLET (40 MG TOTAL) BY MOUTH DAILY. TAKE 30-60 MIN BEFORE FIRST MEAL OF THE DAY 10/10/16   Nyoka Cowden, MD  pregabalin (LYRICA) 75 MG capsule Take by mouth. 05/27/18   [provider]  tizanidine (ZANAFLEX) 2 MG capsule TAKE 1 CAPSULE BY MOUTH AT BEDTIME. 05/13/18   Micki Riley, MD  topiramate (TOPAMAX) 50 MG tablet TAKE 1 TABLET BY MOUTH TWICE A DAY 01/29/19   Micki Riley, MD      Allergies    Codeine, Levofloxacin, and Rice    Review of Systems   Review of Systems  Physical Exam Updated Vital Signs BP 122/62   Pulse 87   Temp 97.7 F (36.5 C) (Oral)   Resp (!) 28   Ht   (1.626 m)   Wt 105.7 kg   SpO2 98%   BMI 39.99 kg/m  Physical Exam Vitals and nursing note reviewed.  Constitutional:      General: She is not in acute distress.    Appearance: She is well-developed.  HENT:     Head: Normocephalic and atraumatic.  Eyes:     Conjunctiva/sclera: Conjunctivae normal.  Cardiovascular:     Rate and Rhythm: Normal rate and regular rhythm.     Heart sounds: No murmur heard. Pulmonary:     Effort: Pulmonary effort is normal. No respiratory distress.     Breath sounds: Wheezing present. No rhonchi.  Abdominal:     Palpations: Abdomen is soft.     Tenderness: There is no abdominal tenderness.  Musculoskeletal:        General:  No swelling.     Cervical back: Neck supple.  Skin:    General: Skin is warm and dry.     Capillary Refill: Capillary refill takes less than 2 seconds.  Neurological:     Mental Status: She is alert.  Psychiatric:        Mood and Affect: Mood normal.     ED Results / Procedures / Treatments   Labs (all labs ordered are listed, but only abnormal results are displayed) Labs Reviewed  BASIC METABOLIC PANEL - Abnormal; Notable for the following components:      Result Value   Glucose, Bld 105 (*)    Calcium 8.6 (*)    All other components within normal limits  CBC WITH DIFFERENTIAL/PLATELET  BRAIN NATRIURETIC PEPTIDE  TROPONIN I (HIGH SENSITIVITY)    EKG None  Radiology CT Angio Chest PE W and/or Wo Contrast  Result Date: 11/11/2022 CLINICAL DATA:  Pulmonary embolism (PE) suspected, high prob EXAM: CT ANGIOGRAPHY CHEST WITH CONTRAST TECHNIQUE: Multidetector CT imaging of the chest was performed using the standard protocol during bolus administration of intravenous contrast. Multiplanar CT image reconstructions and MIPs were obtained to evaluate the vascular anatomy. RADIATION DOSE REDUCTION: This exam was performed according to the departmental dose-optimization program which includes automated exposure control, adjustment of the mA and/or kV according to patient size and/or use of iterative reconstruction technique. CONTRAST:  80mL OMNIPAQUE IOHEXOL 350 MG/ML SOLN COMPARISON:  CT 07/09/2022 FINDINGS: Cardiovascular: Satisfactory opacification of the pulmonary arteries to the segmental level. No evidence of pulmonary embolism. Thoracic aorta is normal in course and caliber. Normal heart size. No pericardial effusion. Mediastinum/Nodes: No enlarged mediastinal, hilar, or axillary lymph nodes. Thyroid gland, trachea, and esophagus demonstrate no significant findings. Small hiatal hernia. Lungs/Pleura: Minor bibasilar dependent subsegmental atelectasis. No focal airspace consolidation. No  pleural effusion. No pneumothorax. Upper Abdomen: No acute abnormality. Musculoskeletal: Sclerosis within the T2 vertebral body is unchanged compared to 2019. Multilevel thoracic spondylosis. Multilevel bridging anterior endplate osteophyte formation throughout the thoracic spine. No acute osseous abnormality. No chest wall abnormality. Review of the MIP images confirms the above findings. IMPRESSION: 1. No evidence of pulmonary embolism or other acute intrathoracic findings. 2. Small hiatal hernia. Electronically Signed   By: Duanne Guess D.O.   On: 11/11/2022 13:02   DG Chest 2 View  Result Date: 11/11/2022 CLINICAL DATA:  Shortness of breath/wheezing EXAM: CHEST - 2 VIEW COMPARISON:  Chest x-ray 07/08/2022. FINDINGS: The heart size and mediastinal contours are within normal limits. Both lungs are clear. No visible pleural effusions or pneumothorax. No acute osseous abnormality. Polyarticular degenerative change. Chronic BB in the soft tissues of the right  chin. IMPRESSION: No active cardiopulmonary disease. Electronically Signed   By: Feliberto Harts M.D.   On: 11/11/2022 11:21    Procedures Procedures    Medications Ordered in ED Medications  ipratropium-albuterol (DUONEB) 0.5-2.5 (3) MG/3ML nebulizer solution 3 mL (3 mLs Nebulization Given 11/11/22 1115)  dexamethasone (DECADRON) injection 10 mg (10 mg Intravenous Given 11/11/22 1219)  iohexol (OMNIPAQUE) 350 MG/ML injection 80 mL (80 mLs Intravenous Contrast Given 11/11/22 1230)    ED Course/ Medical Decision Making/ A&P                             Medical Decision Making Amount and/or Complexity of Data Reviewed Labs: ordered. Radiology: ordered.  Risk Prescription drug management.   This patient presents to the ED for concern of cough, this involves an extensive number of treatment options, and is a complaint that carries with it a high risk of complications and morbidity.  The differential diagnosis includes asthma  exacerbation, pneumonia, fluid overload, others   Co morbidities that complicate the patient evaluation  Asthma   Additional history obtained:   External records from outside source obtained and reviewed including notes from hematology oncology from April 3 where patient was evaluated for history of subsegmental pulmonary embolism.  Patient continued on no anticoagulants at that time   Lab Tests:  I Ordered, and personally interpreted labs.  The pertinent results include: No leukocytosis, normal CBC, initial troponin 2, unremarkable BMP   Imaging Studies ordered:  I ordered imaging studies including chest x-ray and CT angio PE study I independently visualized and interpreted imaging which showed no acute findings on chest x-ray. 1. No evidence of pulmonary embolism or other acute intrathoracic  findings.  2. Small hiatal hernia   I agree with the radiologist interpretation   Cardiac Monitoring: / EKG:  The patient was maintained on a cardiac monitor.  I personally viewed and interpreted the cardiac monitored which showed an underlying rhythm of: Sinus rhythm   Problem List / ED Course / Critical interventions / Medication management   I ordered medication including DuoNeb for wheezing, Decadron for asthma exacerbation Reevaluation of the patient after these medicines showed that the patient improved I have reviewed the patients home medicines and have made adjustments as needed   Test / Admission - Considered:  Patient presents with an asthma exacerbation.  No PE or pneumonia on advanced imaging.  I did run a troponin as patient did mention chest pain but chest pain seems musculoskeletal in nature and initial troponin was negative.  Nonischemic EKG.  Very low clinical suspicion of ACS.  Patient feels much better after breathing treatments and steroids.  Patient has strong oxygen saturations throughout encounter with no supplemental oxygen.  No indication at this time for  admission.  Plan to discharge home with prescription for antibiotics due to asthma exacerbation and recommendations for follow-up with primary care      Final Clinical Impression(s) / ED Diagnoses Final diagnoses:  Exacerbation of asthma, unspecified asthma severity, unspecified whether persistent    Rx / DC Orders ED Discharge Orders          Ordered    azithromycin (ZITHROMAX) 250 MG tablet  Daily        11/11/22 1316              Pamala Duffel 11/11/22 1412    Darrick Grinder, PA-C 11/11/22 1413    Alvira Monday, MD 11/11/22  2141  

## 2022-11-11 NOTE — ED Triage Notes (Signed)
Patient c/o cough and cold symptoms x 1 week.  

## 2023-12-27 NOTE — Progress Notes (Signed)
 [facility]  - Internal Medicine [facility]  [address] [place] [address]                                      12/27/23 Patient name: Anita Benton  Date of birth: 1956-07-26   SUBJECTIVE:  Chief Complaint  Patient presents with  . Hemorrhoids    Anita Benton is a 68 y.o. female comes in today with complaints of hemorrhoids.  She felt a rectal lump 3 days ago, and blood on wipes after straining.  She denies rectal pain.  She felt rectal irritation 3 days ago, but has resolved.  Denies rectal itching.  She denies constipation.  Reports soft BM 1-3 times daily. She continues sitz bath's, she has a walk-in shower to clean.  Colonoscopy done 05/2022:  Findings All observed locations appeared normal. Internal (grade 1) hemorrhoids.  She has sleep apnea, she follows with neurology.  She denies any fever, chills, dizziness, headache, blurred vision. No SOB, chest pain, palpitation. No nausea, vomiting, or abdominal pain.  BP Readings from Last 3 Encounters:  12/27/23 100/74  12/11/23 128/78  12/04/23 132/81    Lab Results  Component Value Date   HGBA1C 6.1 (H) 12/04/2023   HGBA1C 6.0 (H) 08/06/2023   HGBA1C 6.2 (H) 04/05/2023    Lab Results  Component Value Date   LDLCALC 90 12/04/2023   LDLCALC 79 04/05/2023    Lab Results  Component Value Date   CHOL 178 12/04/2023   HDL 65 12/04/2023   LDLCALC 90 12/04/2023   TRIG 132 12/04/2023      The ASCVD Risk score (Arnett DK, et al., 2019) failed to calculate for the following reasons:   Risk score cannot be calculated because patient has a medical history suggesting prior/existing ASCVD  HISTORY: Anita Benton reviewed the allergies, current medications, past medical and surgical history, family and social history, problem list, and updated as needed.  Allergies Allergies  Allergen Reactions  . Codeine  Rash and Anaphylaxis  . Levofloxacin Itching and Swelling  .  Triptans-5-Ht1 Antimigraine Agents Other (See Comments)    Hx of CVA    Medications Current Outpatient Medications  Medication Sig Dispense Refill  . albuterol  HFA (PROVENTIL  HFA;VENTOLIN  HFA;PROAIR  HFA) 90 mcg/actuation inhaler USE 2 INHALATIONS BY MOUTH EVERY 6 HOURS AS NEEDED FOR WHEEZING  OR SHORTNESS OF BREATH 18 g 11  . aspirin  81 mg EC tablet Take 81 mg by mouth Once Daily.    SABRA atenoloL (TENORMIN) 25 mg tablet Take 1 tablet (25 mg total) by mouth daily. 90 tablet 3  . baclofen (LIORESAL) 10 mg tablet TAKE 1 TABLET BY MOUTH 2 TIMES DAILY AS NEEDED. 180 tablet 1  . Breo Ellipta 100-25 mcg/dose dsdv inhaler USE 1 INHALATION BY MOUTH DAILY 180 each 3  . cholecalciferol 25 mcg (1,000 unit) cap Take 2 tablets daily to equal 2000 Units. 180 capsule 3  . Eliquis  2.5 mg tab TAKE 1 TABLET BY MOUTH TWICE  DAILY 200 tablet 2  . ezetimibe (ZETIA) 10 mg tablet Take 1 tablet (10 mg total) by mouth daily. 90 tablet 1  . famotidine  (PEPCID ) 20 mg tablet Take 20 mg by mouth daily.    . furosemide (LASIX) 20 mg tablet TAKE 1 TABLET BY MOUTH DAILY 90 tablet 1  . gabapentin  (NEURONTIN ) 300 mg capsule Take 1 capsule (300 mg total) by mouth  2 (two) times a day. 180 capsule 0  . magnesium  oxide 400 mg (241 mg magnesium ) tab Take 1 tablet (400 mg total) by mouth 2 (two) times a day. 180 tablet 3  . multivitamin cap Take 1 capsule by mouth Once Daily.    . pantoprazole  (PROTONIX ) 40 mg EC tablet TAKE 1 TABLET BY MOUTH DAILY 90 tablet 1  . potassium chloride  (KLOR-CON ) 10 mEq ER tablet TAKE 1 TABLET BY MOUTH 2 TIMES A DAY. 180 tablet 1  . rosuvastatin  (CRESTOR ) 40 mg tablet TAKE 1 TABLET BY MOUTH DAILY 90 tablet 1  . zonisamide (ZONEGRAN) 100 mg capsule Take 2 caps nightly 180 capsule 1  . albuterol  0.63 mg/3 mL nebulizer solution Take 3 mL (0.63 mg total) by nebulization every 6 (six) hours as needed for wheezing. 180 mL 1  . diclofenac sodium (VOLTAREN) 1 % gel Apply 2 g topically 4 (four) times a day. 720 g  0  . fluticasone  propionate (FLONASE ) 50 mcg/spray nasal spray Administer 1 spray into each nostril daily. 9.9 mL 0  . hydrocortisone (ANUSOL-HC) 2.5 % rectal cream Insert into the rectum 2 (two) times a day for 10 days. 30 g 1  . loratadine (CLARITIN) 10 mg tablet Take 1 tablet (10 mg total) by mouth daily as needed for allergies.    . LORazepam  (ATIVAN ) 1 mg tablet Take 1 tablet 30 minutes before procedure, may repeat once. 2 tablet 0  . miscellaneous medical supply misc Seated roller walker.  ICD: R42, R26.89 1 each 0  . tirzepatide, weight loss, (ZEPBOUND) 2.5 mg/0.5 mL subcutaneous pen injector Inject 0.5 mL (2.5 mg total) under the skin every 7 days. 2 mL 0   Current Facility-Administered Medications  Medication Dose Route Frequency Provider Last Rate Last Admin  . methylPREDNISolone  acetate (DEPO-Medrol ) injection 80 mg  80 mg intramuscular Once Sara M Furr, MD        Family history Family History  Problem Relation Name Age of Onset  . Lung cancer Mother         Lung to breast  . Arthritis Mother    . Hypertension Mother    . Heart attack Father    . Bone cancer Father    . Rheum arthritis Sister    . Diabetes Maternal Grandmother    . Heart attack Maternal Grandmother    . Heart disease Maternal Grandmother    . Heart attack Brother    . Diabetes Maternal Uncle    . Thyroid disease Maternal Uncle    . Breast cancer Cousin mat 1st cousin   . Lung cancer Cousin mat 1st cousin   . Anesthesia problems Neg Hx    . Asthma Neg Hx    . Cancer Neg Hx    . Cerebral palsy Neg Hx    . Clotting disorder Neg Hx    . Club foot Neg Hx    . Collagen disease Neg Hx    . Deep vein thrombosis Neg Hx    . Gait disorder Neg Hx    . Gout Neg Hx    . Hip dysplasia Neg Hx    . Hip fracture Neg Hx    . Hypermobility Neg Hx    . Osteoporosis Neg Hx    . Other Neg Hx    . Pulmonary disease Neg Hx    . Rheumatologic disease Neg Hx    . Scoliosis Neg Hx    . Spina bifida Neg Hx    .  Stroke Neg Hx    . Vasculitis Neg Hx      Social history Social History   Tobacco Use  Smoking Status Never  Smokeless Tobacco Never    Surgical history Past Surgical History:  Procedure Laterality Date  . CESAREAN SECTION, UNSPECIFIED     Procedure: CESAREAN SECTION; x3  . COLONOSCOPY     Procedure: COLONOSCOPY  . EPIDURAL BLOCK INJECTION Bilateral 04/27/2020   Procedure: LUMBAR MEDIAL BRANCH BLOCK Bilateral L4-S1;  Surgeon: Janus Von Adora Blanch, MD;  Location: [facility];  Service: Pain Services;  Laterality: Bilateral;  . EPIDURAL BLOCK INJECTION Bilateral 06/12/2022   Procedure: LUMBAR MEDIAL BRANCH BLOCK  L4-5, L5-S1 BILATERAL #1/2;  Surgeon: Constantino Bong, MD;  Location: [facility];  Service: Pain Services;  Laterality: Bilateral;  on anticoagulation  . EPIDURAL BLOCK INJECTION Bilateral 07/03/2022   Procedure: LUMBAR MEDIAL BRANCH BLOCK  L4-5, L5-S1 BILATERAL #2/2;  Surgeon: Constantino Bong, MD;  Location: [facility];  Service: Pain Services;  Laterality: Bilateral;  . FINGER SURGERY     Procedure: FINGER SURGERY; to remove foreign object from right long finger    . HYSTERECTOMY      Procedure: HYSTERECTOMY  . JOINT REPLACEMENT Right 2016   Procedure: JOINT REPLACEMENT; hip  . KNEE ARTHROSCOPY     Procedure: KNEE ARTHROSCOPY  . KNEE ARTHROSCOPY W/ SYNOVECTOMY Right 05/31/2017   Procedure: KNEE ARTHROSCOPY W/ SYNOVECTOMY;  Surgeon: Vinie Carlin Fonder, MD;  Location: [facility];  Service: Orthopedics;  Laterality: Right;  . KNEE JOINT MANIPULATION Right 04/17/2018   Procedure: CLOSED MANIPULATION OF KNEE UNDER ANESTHESIA;  Surgeon: Vinie Carlin Fonder, MD;  Location: [facility];  Service: Orthopedics;  Laterality: Right;  . NERVE BLOCK Right 09/04/2022   Procedure: SUPRASCAPULAR NERVE BLOCK 64418 and AXILLARY NERVE BLOCK 35582;  Surgeon: Constantino Bong, MD;  Location: [facility];  Service: Pain Services;  Laterality: Right;   . RADIOFREQUENCY ABLATION NERVES Left 07/17/2022   Procedure: LUMBAR RADIOFREQUENCY  L4-5, L5-S1 LEFT #1/2;  Surgeon: Constantino Bong, MD;  Location: [facility];  Service: Pain Services;  Laterality: Left;  . RADIOFREQUENCY ABLATION NERVES Right 08/07/2022   Procedure: LUMBAR RADIOFREQUENCY  L4-5, L5-S1 RIGHT #2/2;  Surgeon: Constantino Bong, MD;  Location: [facility];  Service: Pain Services;  Laterality: Right;  . RHIZOTOMY W/ RADIOFREQUENCY ABLATION Right 07/09/2023   RHIZOTOMY FACET RADIOFREQUENCY  SUPRASCAPULAR RIGHT #1/1 performed by Bong Constantino, MD at [facility]  . SACROILIAC JOINT INJECTION Bilateral 05/14/2020   Procedure: SACROILIAC JOINT INJECTION bilateral SI;  Surgeon: Janus Von Adora Blanch, MD;  Location: [facility];  Service: Pain Services;  Laterality: Bilateral;  . SACROILIAC JOINT INJECTION Bilateral 07/12/2021   Procedure: SACROILIAC JOINT INJECTION;  Surgeon: Janus Von Adora Blanch, MD;  Location: [facility];  Service: Pain Services;  Laterality: Bilateral;  . SACROILIAC JOINT INJECTION Bilateral 04/24/2022   Procedure: SACROILIAC JOINT INJECTION;  Surgeon: Constantino Bong, MD;  Location: [facility];  Service: Pain Services;  Laterality: Bilateral;  On apixaban   . SACROILIAC JOINT INJECTION Bilateral 06/04/2023   INJECTION/ASPIRATION JOINT SACROILLIAC **Bilat SI joints performed by Bong Constantino, MD at [facility]  . TOTAL HIP ARTHROPLASTY Left 12/30/2019   Procedure: TOTAL HIP ARTHROPLASTY;  Surgeon: Artist Lynwood Sharps, MD;  Location: [facility];  Service: Orthopedics;  Laterality: Left;  . TOTAL KNEE ARTHROPLASTY Right 02/27/2018   Procedure: TOTAL KNEE ARTHROPLASTY;  Surgeon: Vinie Carlin Fonder, MD;  Location:  [facility];  Service: Orthopedics;  Laterality: Right;  . TRIGGER POINT INJECTION Right 11/06/2022   PERIPHERAL NERVE BLOCK: Right knee peripheral nerve block 64450 x2. (nerve to vastus lateralis, lateral branch  of nerve to vastus intermedius) performed by Shelly Charity, MD at [facility]  . TRIGGER POINT INJECTION Right 06/25/2023   BLOCK/INJECTION SUPRASCAPULAR NERVE/SHOULDER AND RIGHT AXILLARY NERVE BLOCK #2/2 performed by Shelly Charity, MD at [facility]  . VEIN LIGATION AND STRIPPING Bilateral    Procedure: VEIN LIGATION AND STRIPPING; x2    Preventive Care Health Maintenance  Topic Date Due  . Hepatitis A Vaccines (1 of 2 - Risk 2-dose series) Never done  . Hepatitis B Vaccines (2 of 3 - Risk 3-dose series) 03/09/2014  . Adult RSV (60+ Years or Pregnancy) (1 - Risk 60-74 years 1-dose series) Never done  . COVID-19 Vaccine (6 - 2024-25 season) 12/03/2024 (Originally 11/29/2023)  . Comprehensive Annual Visit  04/04/2024  . Medicare Annual Wellness (AWV) Subsequent Visits  04/04/2024  . Breast Cancer Screening (Mammogram)  04/15/2024  . Depression Monitoring  06/28/2024  . Bone Density Scan  11/29/2024  . Diabetes Screening  12/03/2024  . DTaP/Tdap/Td Vaccines (3 - Td or Tdap) 04/19/2026  . Colorectal Cancer Screening  06/09/2032  . Influenza Vaccine  Completed  . Pneumococcal Vaccine for Ages 50+  Completed  . ZOSTER VACCINE  Completed  . Hepatitis C Screening  Completed  . HIB Vaccines  Aged Out  . IPV Vaccines  Aged Out  . Meningococcal Conjugate (ACWY) Vaccine  Aged Out  . Rotavirus Vaccines  Aged Out  . HPV Vaccines  Aged Out  . Meningococcal B Vaccine  Aged Out  . Depression Screening  Discontinued     Immunization History  Administered Date(s) Administered  . Covid-19, Mrna, Lnp-s, Pf, Tris-sucrose, 30 Mcg/0.3 Ml 06/01/2023  . Hep B, Adolescent or Pediatric 02/09/2014  . Hepatitis B Vaccine 02/09/2014  . Influenza whole 04/30/2016  . Influenza, Injectable, Quadrivalent, Preservative Free 04/16/2013, 05/15/2019, 05/26/2020, 05/27/2021, 06/18/2022  . Influenza, Injectable, Quadrivalent, With Preservative 04/30/2017  . Influenza, Unspecified 06/14/2015, 04/30/2017,  05/14/2018  . Influenza, split virus, trivalent, preservative 06/13/2011  . Pfizer SARS-CoV-2 Bivalent 12+ yrs 06/21/2021  . Pfizer SARS-CoV-2 Primary Series 12+ yrs 10/24/2019, 11/14/2019, 06/10/2020  . Pneumococcal Conjugate Vaccine 20-Valent (PREVNAR-20) 6 wks+ 08/06/2023  . Pneumococcal Polysaccharide Vaccine, 23 Valent (PNEUMOVAX-23) 2Y+ 06/27/2011  . Pneumococcal, Unspecified 06/27/2011  . TDAP VACCINE (BOOSTRIX ,ADACEL) 7Y+ 04/30/2011, 04/19/2016  . Varicella Zoster [facility]) 18Y+ 05/15/2019, 05/29/2020  . mdfluenza, MDCK, trivalent, PF 06/01/2023     ROS  Review of Systems  Constitutional:  Negative for appetite change, chills and fever.  Eyes:  Negative for visual disturbance.  Respiratory:  Negative for chest tightness, shortness of breath and wheezing.   Cardiovascular:  Negative for chest pain and palpitations.  Gastrointestinal:  Positive for anal bleeding. Negative for abdominal pain, blood in stool, diarrhea, nausea and vomiting.  Genitourinary:  Negative for difficulty urinating, dysuria, flank pain and hematuria.  Musculoskeletal:  Positive for arthralgias and back pain.  Skin:  Negative for rash and wound.  Neurological:  Negative for dizziness, seizures, syncope and headaches.  Psychiatric/Behavioral:  Negative for agitation, behavioral problems, confusion, self-injury and suicidal ideas.   All other systems reviewed and are negative.   Review of Systems - All other systems reviewed are negative except as noted above.  OBJECTIVE  BP 100/74 (BP Location: Right arm, Patient Position: Sitting)  Pulse 75   Ht 1.626 m (5' 4)   Wt 105 kg (232 lb)   SpO2 97%   BMI 39.82 kg/m    PHYSICAL:  Physical Exam Vitals and nursing note reviewed.  Constitutional:      General: She is not in acute distress.    Appearance: Normal appearance.     Comments: Using a cane to ambulate.  HENT:     Head: Normocephalic and atraumatic.  Eyes:     Pupils: Pupils are equal,  round, and reactive to light.  Neck:     Vascular: No carotid bruit.  Cardiovascular:     Rate and Rhythm: Normal rate and regular rhythm.     Heart sounds: No murmur heard.    No friction rub. No gallop.  Pulmonary:     Effort: Pulmonary effort is normal. No respiratory distress.     Breath sounds: No wheezing, rhonchi or rales.  Abdominal:     General: Bowel sounds are normal.     Palpations: Abdomen is soft.     Tenderness: There is no abdominal tenderness.  Genitourinary:    Comments: A prolapsed internal hemorrhoid at 3:00, no active bleeding. Musculoskeletal:     Cervical back: Normal range of motion and neck supple.     Right lower leg: No edema.     Left lower leg: No edema.  Skin:    General: Skin is warm.     Findings: No rash.  Neurological:     General: No focal deficit present.     Mental Status: She is alert and oriented to person, place, and time.  Psychiatric:        Mood and Affect: Mood normal.        Behavior: Behavior normal.    ASSESSMENT/PLAN:  1. Hemorrhoid prolapse (Primary) Clean the area 2 times daily and each time after BM.  Use Anusol cream. - hydrocortisone (ANUSOL-HC) 2.5 % rectal cream; Insert into the rectum 2 (two) times a day for 10 days.  Dispense: 30 g; Refill: 1  2. OSA (obstructive sleep apnea) Follow-up with neurology, Start Zepbound. - tirzepatide, weight loss, (ZEPBOUND) 2.5 mg/0.5 mL subcutaneous pen injector; Inject 0.5 mL (2.5 mg total) under the skin every 7 days.  Dispense: 2 mL; Refill: 0  3. Class 2 severe obesity due to excess calories with serious comorbidity and body mass index (BMI) of 39.0 to 39.9 in adult (CMD) Wt Readings from Last 3 Encounters:  12/27/23 105 kg (232 lb)  12/18/23 104 kg (229 lb)  12/04/23 103 kg (227 lb)  It is hard for her to lose weight, due to multiple comorbidities, back pain, unsteady gait. Advised patient to continue low-calorie diet, start Zepbound. - tirzepatide, weight loss, (ZEPBOUND)  2.5 mg/0.5 mL subcutaneous pen injector; Inject 0.5 mL (2.5 mg total) under the skin every 7 days.  Dispense: 2 mL; Refill: 0  4. Benign essential HTN BP Readings from Last 3 Encounters:  12/27/23 100/74  12/11/23 128/78  12/04/23 132/81  BP is well-controlled.  Continue treatment.    Goals of care discussed with patient including medication compliance and adequate follow up. Patient verbalizesunderstanding and in agreement with the above plan. All questions answered.   I agree the documentation is accurate and complete.   Future Appointments  Date Time Provider Department Center  01/02/2024  8:15 AM Lucie Hummer, PT [facility] OPPT EL [facility] [address]  01/04/2024 10:00 AM Saddie Nance Pay, PT [facility] OPPT [facility] [address]  01/09/2024 10:45 AM Saddie Nance Pay, PT [facility] OPPT EL [facility] [address]  01/11/2024  8:45 AM Lucie Hummer, PT [facility] OPPT EL [facility] [address]  01/14/2024 10:30 AM Lucie Hummer, PT [facility] OPPT EL [facility] [address]  01/16/2024  9:45 AM Lucie Hummer, PT [facility] OPPT [facility] [address]  01/21/2024 10:30 AM Lucie Hummer, PT [facility] OPPT EL [facility] [address]  01/23/2024  9:45 AM Lucie Hummer, PT [facility] OPPT EL [facility] [address]  01/28/2024  9:45 AM Lucie Hummer, PT [facility] OPPT EL [facility] [address]  01/30/2024  9:45 AM Lucie Hummer, PT [facility] OPPT EL [facility] [address]  02/13/2024 11:20 AM Eva Pastor Start, PA-C [facility] PUL PRE [facility]  04/04/2024  9:20 AM Talitha Flatten, MD [facility]  05/29/2024 11:30 AM Selinda Vicenta Albino, MD [facility]  07/03/2024 10:00 AM Sharri Fortune, MD [facility]  08/12/2024  2:00 PM Erna Creighton, MD [facility]   This document serves as a record of services personally performed by Dr. Flatten.  It was created on their behalf by Ronal LITTIE Ring, CMA, a trained medical scribe, and Certified Medical Assistant (CMA). During the course of documenting the history, physical exam and medical decision making, I was functioning as a Stage manager. The  creation of this record is the provider's dictation and/or activities during the visit.     This document was created using the aidof voice recognition Scientist, clinical (histocompatibility and immunogenetics).
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
## 2024-01-09 NOTE — Progress Notes (Signed)
 Physical Therapy Missed Visit Note  Payor: [employer] MEDICARE ADV / Plan: [employer] MA DUAL COMPLETE RPPO-SNP / Product Type: Medicare Advantage /     The patient did not attend therapy appointment.  Reason:  Patient ill  Total Missed Visits: Missed Visit Count: 1 tx  Patient Contact:  Patient called the same day to cancel  Future Appointments:  Patient has additional appointments.  Action:  No action required.

## 2024-02-06 NOTE — ED Provider Notes (Signed)
 [facility] Emergency Department Emergency Department Provider Note   Provider at bedside: 02/06/2024 11:18 AM  Chief Complaint:  Dizziness, syncope  History of Present Illness:  History obtained from: patient, family at bedside.    Anita Benton is a 68 y.o. female with PMHx of vertigo,Afib, HDL, DVT, HTN, who presents to the ED with complaint of dizziness this morning after taking a shower at 8:35 AM. She states she felt like she was going to pass out, was able to make it to the toilet. Family reports she was found slumped over on the toilet. Family states afterwards, she was talking to patient, eyes became fixed and she passed out again.  She reports dizziness lasted longer than her normal vertigo episodes. She complains of headache, right sided weakness. She also reports her heart was beating funny yesterday. She reports she got up at 8:20 AM this morning and felt fine.    ______________________  ROS: Pertinent positives and negatives per HPI. Pertinent past medical, surgical, social and family history records were reviewed. Current Medications and Allergies were reviewed.  Physical Exam   Vitals:   02/06/24 1030 02/06/24 1115 02/06/24 1130 02/06/24 1200  BP: 129/74 129/76 132/72 152/66  BP Location:      Patient Position:      Pulse: 76 68 71 89  Resp: 15 (!) 22 15 17   Temp:      TempSrc:      SpO2: 96% 96% 96% 97%  Weight:      Height:          Physical Exam Vitals and nursing note reviewed.  Constitutional:      General: She is not in acute distress.    Appearance: Normal appearance.  HENT:     Head: Normocephalic and atraumatic.     Right Ear: External ear normal.     Left Ear: External ear normal.     Nose: Nose normal.     Mouth/Throat:     Mouth: Mucous membranes are moist.   Eyes:     Pupils: Pupils are equal, round, and reactive to light.    Cardiovascular:     Rate and Rhythm: Normal rate and regular rhythm.     Heart  sounds: Normal heart sounds.  Pulmonary:     Effort: Pulmonary effort is normal.     Breath sounds: Normal breath sounds.  Abdominal:     Palpations: Abdomen is soft.     Tenderness: There is no abdominal tenderness.   Musculoskeletal:        General: Normal range of motion.     Cervical back: Normal range of motion.   Skin:    General: Skin is warm.   Neurological:     General: No focal deficit present.     Mental Status: She is alert and oriented to person, place, and time.     Cranial Nerves: No cranial nerve deficit, dysarthria or facial asymmetry.     Sensory: Sensory deficit present.     Motor: Weakness (right arm, right leg) present. No tremor or seizure activity.     Coordination: Finger-Nose-Finger Test abnormal (on right).     Comments: Subjective decreased sensation right side of face, right arm and right leg  Psychiatric:        Mood and Affect: Mood normal.        Behavior: Behavior normal.        Thought Content: Thought content normal.     Results  EKG Impression:   My Interpretation: EKG For acute MI, arrhythmia, conduction abnormality, ischemia, electrolyte abnormality was performed and showed EKG time: 1023 Interpretation time: 12:59 PM Rate: 76 Rhythm: Normal sinus rhythm with occasional premature ventricular contractions (PVC) Axis: Left axis deviation Intervals: Normal ST-T Waves: Nonspecific T wave abnormality Comparison with Old:  Yes -as compared to March 06, 2023 the nonspecific T wave changes are slightly more pronounced  Labs:  Lab Results (last 24 hours)     Procedure Component Value Ref Range Date/Time   Prothrombin Time (PT) with INR [[id number]]  (Normal) Collected: 02/06/24 1221   Lab Status: Final result Specimen: Blood from Venous Updated: 02/06/24 1240    Prothrombin Time (PT) 14.4 11.8 - 14.4 seconds     International Normalized Ratio (INR) 1.1 0.0 - 1.5    CBC with Differential [[id number]]  (Abnormal) Collected: 02/06/24  1036   Lab Status: Final result Specimen: Blood from Venous Updated: 02/06/24 1050   Narrative:     The following orders were created for panel order CBC with Differential. Procedure                               Abnormality         Status                    ---------                               -----------         ------                    CBC with Differential[[id number]]       Abnormal            Final result               Please view results for these tests on the individual orders.   Comprehensive Metabolic Panel [[id number]]  (Abnormal) Collected: 02/06/24 1036   Lab Status: Final result Specimen: Blood from Venous Updated: 02/06/24 1116    Sodium 141 136 - 145 mmol/L     Potassium 3.7 3.4 - 4.5 mmol/L     Chloride 111* 98 - 107 mmol/L     CO2 24 21 - 31 mmol/L     Anion Gap 6 6 - 14 mmol/L     Glucose, Random 103* 70 - 99 mg/dL     Blood Urea Nitrogen (BUN) 12 7 - 25 mg/dL     Creatinine 9.40* 9.39 - 1.20 mg/dL     eGFR >09 >40 fO/fpw/8.26f7     Comment: GFR estimated by CKD-EPI equations(NKF 2021).   Recommend confirmation of Cr-based eGFR by using Cys-based eGFR and other filtration markers (if applicable) in complex cases and clinical decision-making, as needed.      Albumin 3.4* 3.5 - 5.7 g/dL     Total Protein 6.2* 6.4 - 8.9 g/dL     Bilirubin, Total 0.4 0.3 - 1.0 mg/dL     Alkaline Phosphatase (ALP) 67 34 - 104 U/L     Aspartate Aminotransferase (AST) 17 13 - 39 U/L     Alanine Aminotransferase (ALT) 14 7 - 52 U/L     Calcium  8.4* 8.6 - 10.3 mg/dL     BUN/Creatinine Ratio 20.3* 10.0 - 20.0     Comment: Potential prerenal damage (e.g. CHF,  GI Bleeding)      Corrected Calcium  8.9 mg/dL     Comment: Reference Ranges Not Established. Recommend testing using ionized calcium .     CBC with Differential [[id number]]  (Abnormal) Collected: 02/06/24 1036   Lab Status: Final result Specimen: Blood from Venous Updated: 02/06/24 1050    WBC 6.70 4.40 - 11.00 10*3/uL      RBC 5.06 4.10 - 5.10 10*6/uL     Hemoglobin 15.1 12.3 - 15.3 g/dL     Hematocrit 54.9* 64.0 - 44.6 %     Mean Corpuscular Volume (MCV) 88.9 80.0 - 96.0 fL     Mean Corpuscular Hemoglobin (MCH) 29.8 27.5 - 33.2 pg     Mean Corpuscular Hemoglobin Conc (MCHC) 33.5 33.0 - 37.0 g/dL     Red Cell Distribution Width (RDW) 14.1 12.3 - 17.0 %     Platelet Count (PLT) 225 150 - 450 10*3/uL     Mean Platelet Volume (MPV) 7.4 6.8 - 10.2 fL     Neutrophils % 57 %     Lymphocytes % 33 %     Monocytes % 9 %     Eosinophils % 1 %     Basophils % 1 %     Neutrophils Absolute 3.80 1.80 - 7.80 10*3/uL     Lymphocytes # 2.20 1.00 - 4.80 10*3/uL     Monocytes # 0.60 0.00 - 0.80 10*3/uL     Eosinophils # 0.10 0.00 - 0.50 10*3/uL     Basophils # 0.10 0.00 - 0.20 10*3/uL    Troponin, High Sensitive [[id number]]  (Normal) Collected: 02/06/24 1036   Lab Status: Final result Specimen: Blood from Venous Updated: 02/06/24 1123    Troponin, High Sensitive 3 <15 ng/L     Comment: >= 15 ng/L INDICATES MYOCARDIAL DAMAGE.THE DIAGNOSIS OF MYOCARDIAL INFARCTION REQUIRES CLINICAL CORRELATION.   Elevated troponin may also be due to myocardial stress from a variety of causes.   Alkaline Phos (ALP) levels >400 U/L may cause falsely elevated results. Troponin test is invalid in patients taking asfotase alpha.   This troponin assay was not validated for evaluation of troponin in patients younger than 21 years. There are no ranges established for patients younger than 21 years. Therefore, laboratory results for these patients should be  interpreted with caution.       POC Glucose [[id number]]  (Abnormal) Collected: 02/06/24 1029   Lab Status: Final result Specimen: Blood from Capillary Updated: 02/06/24 1030    Glucose, POC 115* 70 - 99 mg/dL     Comment: Recheck/confirm         My initial interpretation of patient's labs shows that the CBC and CMP were essentially normal except for albumin and protein and calcium  are  low.  Patient's CBC was essentially normal was initial troponin rule out ischemia was negative and PT/INR were normal.  CXR Impression:  (Interpreted by me) My Interpretation: Chest xray for pneumonia, pneumothorax, pleural effusion, pulmonary edema, cardiomegaly, aortic abnormality was performed and did not show any acute cardiopulmonary findings.  Impression of additional imaging studies include: CT of the head had already been performed and was negative for any acute findings.  CT angiography of the head and neck with code stroke and perfusion did not show any LVO or significant blockages or perfusion deficits.  Imaging:  Radiology Results (last 72 hours)     Procedure Component Value Units Date/Time   XR Chest 1 View - In process [[id number]] Resulted: 02/06/24 1230   Order  Status: Sent Updated: 02/06/24 1231   This result has not been signed. Information might be incomplete.     CT Code Stroke W Perfusion [[id number]] Collected: 02/06/24 1203   Order Status: Completed Updated: 02/06/24 1226   Narrative:     CLINICAL DATA:  Provided history: Neuro deficit, acute, stroke suspected. Dizziness. Headache. Right-sided weakness.  EXAM: CT ANGIOGRAPHY HEAD AND NECK  CT PERFUSION BRAIN  TECHNIQUE: Multidetector CT imaging of the head and neck was performed using the standard protocol during bolus administration of intravenous contrast. Multiplanar CT image reconstructions and MIPs were obtained to evaluate the vascular anatomy. Carotid stenosis measurements (when applicable) are obtained utilizing NASCET criteria, using the distal internal carotid diameter as the denominator.  Multiphase CT imaging of the brain was performed following IV bolus contrast injection. Subsequent parametric perfusion maps were calculated using RAPID software.  RADIATION DOSE REDUCTION: This exam was performed according to the departmental dose-optimization program which includes automated exposure  control, adjustment of the mA and/or kV according to patient size and/or use of iterative reconstruction technique.  CONTRAST:  100 mL iohexoL  (OMNIPAQUE ) 350 mg iodine/mL injection (MDV) 100 mL  COMPARISON:  Non-contrast head CT performed earlier today 02/06/2024. CTA 12/13/2022.  FINDINGS: CTA NECK FINDINGS  Aortic arch: Standard aortic branching. The visible thoracic aorta is normal in caliber. Minimal a sclerotic plaque within the proximal left subclavian artery. No hemodynamically significant innominate or proximal subclavian artery stenosis.  Right carotid system: CCA and ICA patent within the neck without stenosis or significant atherosclerotic disease.  Left carotid system: CCA and ICA patent within the neck without stenosis or significant atherosclerotic disease. Partially retropharyngeal course of the cervical ICA.  Vertebral arteries: Codominant and patent within the neck without stenosis or significant atherosclerotic disease.  Skeleton: Spondylosis of the cervical and visualized upper thoracic levels. Nonspecific chronic sclerotic focus within the T2 vertebral body.  Other neck: Subcentimeter left thyroid lobe nodule not meeting consensus criteria for ultrasound follow-up based on size. No follow-up imaging recommended. Reference: J Am Coll Radiol. 2015 Feb;12(2): 143-50.  Upper chest: No consolidation within the imaged lung apices.  Review of the MIP images confirms the above findings  CTA HEAD FINDINGS  Anterior circulation:  The intracranial internal carotid arteries are patent. The M1 middle cerebral arteries are patent. No M2 proximal branch occlusion or high-grade proximal stenosis. The anterior cerebral arteries are patent. Hypoplastic left A1 segment. No intracranial aneurysm is identified.  Posterior circulation:  The intracranial vertebral arteries are patent. The basilar artery is patent. The posterior cerebral arteries are patent. A  right posterior communicating artery is present. The left posterior communicating artery is diminutive or absent.  Venous sinuses: Within the limitations of contrast timing, no convincing thrombus.  Anatomic variants: As described.  Review of the MIP images confirms the above findings  CT Brain Perfusion Findings:  CBF (<30%) Volume: 0mL  Perfusion (Tmax>6.0s) volume: 0mL  Mismatch Volume: 0mL  Infarction Location: None identified  No emergent large vessel occlusion identified. These results were called by telephone at the time of interpretation on 02/06/2024 at 12:22 pm to provider [facility] , who verbally acknowledged these results.    Impression:     CTA neck:  The common carotid, internal carotid and vertebral arteries are patent within neck without stenosis or significant atherosclerotic disease.  CTA head:  No proximal intracranial large vessel occlusion or high-grade proximal arterial stenosis identified.  CT perfusion head:  The perfusion software identifies no core infarct. The perfusion  software identifies no critically hypoperfused parenchyma (utilizing the Tmax>6 seconds threshold). No mismatch volume reported.   Electronically Signed   By: Rockey Childs D.O.   On: 02/06/2024 12:24    CT Head WO Contrast [[id number]] Collected: 02/06/24 1111   Order Status: Completed Updated: 02/06/24 1114   Narrative:     CLINICAL DATA:  Vertigo, dizziness  EXAM: CT HEAD WITHOUT CONTRAST  TECHNIQUE: Contiguous axial images were obtained from the base of the skull through the vertex without intravenous contrast.  RADIATION DOSE REDUCTION: This exam was performed according to the departmental dose-optimization program which includes automated exposure control, adjustment of the mA and/or kV according to patient size and/or use of iterative reconstruction technique.  COMPARISON:  02/14/2021  FINDINGS: Brain: Normal anatomic configuration. Parenchymal  volume loss is commensurate with the patient's age. Stable mild periventricular white matter changes are present likely reflecting the sequela of small vessel ischemia. No abnormal intra or extra-axial mass lesion or fluid collection. No abnormal mass effect or midline shift. No evidence of acute intracranial hemorrhage or infarct. Ventricular size is normal. Cerebellum unremarkable.  Vascular: No asymmetric hyperdense vasculature at the skull base.  Skull: Intact  Sinuses/Orbits: Paranasal sinuses are clear. Orbits are unremarkable.  Other: Mastoid air cells and middle ear cavities are clear.    Impression:     1. No acute intracranial abnormality. 2. Stable mild periventricular white matter changes likely reflecting the sequela of small vessel ischemia.   Electronically Signed   By: Dorethia Molt M.D.   On: 02/06/2024 11:12         Procedures   Procedures  Evidence Based Calculators      ED Course   ED Course as of 02/06/24 1259  Wed Feb 06, 2024  1137 TIER 1 Code Stroke activated.  [RI]  1158 Patient's initial vital signs are stable. [LT]  1158 Intervention-patient had some weakness in her right arm and right leg with decreased sensation and ataxia on the right side with numbness to the right side of her face as well that started at almost 9:00 this morning.  Patient will be called a tier 1 code stroke. [LT]  1223 Case dicussed with radiology who reviewed the CT, advised no hemorrhage, no LVO.  [RI]  1233 Case discussed with Teleneurology who will did not feel patient is a TNK candidate.  [RI]  1235 Dr. Irena from telestroke was consulted and she felt like with the patient having the syncopal event, this could have worsened her previous stroke symptoms which were on the right as well.  She did not feel with a normal CT angiography of the head and neck and abnormal perfusion study to give this patient TNK.  We will consult our neurologist to see the patient and  hospitalist will be consulted for admission. [LT]  1236 Disposition-admit. [LT]  1253 Dr. Worth Ruth from neurology to see [LT]    ED Course User Index [LT] Rock Dannielle Birmingham, MD [RI] Asberry Opal, Scribe    Medical Decision Making   External records were reviewed:  Neurology office visit dated 01/14/24 sen for obstructive sleep apnea.  _________________________  Anita Benton is a 68 y.o. female  who presents to the ED with complaint of dizziness this morning after taking a shower at 8:35 AM. She states she felt like she was going to pass out, was able to make it to the toilet. Family reports she was found slumped over on the toilet. Family states afterwards, she was  talking to patient, eyes became fixed and she passed out again.  She reports dizziness lasted longer than her normal vertigo episodes. She complains of headache, right sided weakness. She also reports her heart was beating funny yesterday. She reports she got up at 8:20 AM this morning and felt fine. . On my initial evaluation, patient had a sinus rhythm with occasional PACs and PVCs.  Patient had decreased sensation to her right arm or right leg with weakness to the right arm and right leg and decreased sensation also to the right side of her face with ataxia of the right hand.  The following differentials were considered:  IIK:AEEC, labyrinthitis, Meniere's, Brainstem infarction, CVA, migraine, MS, Carotid sinus hypersensitivity, otitis media, post-concussive syndrome, mediations, hyperventilation, VIII nerve tumor, acoustic neuroma, cerebellar tumor, neurofibromatosis, MI, ACS, HTN emergency, UTI, infection, AKI, dehydration, orthostasis, electrolyte abnormalities     Pertinent studies were obtained, with results listed in chart above.  results interpreted as above.  Based on ED workup, findings are consistent with syncope and collapse, right-sided weakness, numbness on the right side.    Patient received  aspirin  in the ER.  On reevaluation, symptoms are improved.  Clinical Assessment/Plan:  Patient presented to the ER with syncope that occurred twice after she got out of the shower and on her toilet but then she was also complaining of right-sided weakness and right-sided numbness.  Patient had a stroke in the past and she has symptoms on that same side.  Her NIH stroke scale score was about a 4, and will be called telestroke since the patient's symptoms started at 835 this morning, they did not recommend TNK because she has had a previous stroke and her symptoms were very similar, and he felt like with a history of syncope x 2 the weakness from your previous stroke can definitely feel worse.  She will be evaluated as well by her neurologist Dr. Worth Ruth in the ER.  Telestroke, Dr. Irena, did not feel patient was a TNK candidate.  Patient will be admitted to the hospitalist service for syncope and right-sided weakness and numbness.  Discussion of management or test interpretation with external provider(s):  Hospitalist was consulted for admission and telestroke Dr. Irena was consulted as well as neurology was consulted Dr. Worth Ruth.   Clinical Complexity/Risk   Patient's presentation is most consistent with acute presentation with potential threat to life or bodily function.  Patient's multiple comorbidities increases the complexity of managing their  presentation with stroke.    Pt presentation is complicated by their history of CVA/TIA and High Cholesterol, resulting in increased risk of CVA  Provider time spent in patient care today, inclusive of but not limited to clinical reassessment, review of diagnostic studies, and discharge preparation, was greater than 30 minutes.  OTC medications: no, patient admitted Prescription medications discussed: no, patient admitted  ED Clinical Impression   Diagnoses that have been ruled out:  None  Diagnoses that are still under consideration:   None  Final diagnoses:  Syncope and collapse  Right sided weakness  Numbness on right side    ED Assessment/Plan   ED Disposition     ED Disposition  Admit   Condition  --   Comment  --         DISCHARGE MEDICATIONS    Medication List     ASK your doctor about these medications    * albuterol  0.63 mg/3 mL nebulizer solution Take 3 mL (0.63 mg total) by nebulization every  6 (six) hours as needed for wheezing.   * albuterol  HFA 90 mcg/actuation inhaler Commonly known as: PROVENTIL  HFA;VENTOLIN  HFA;PROAIR  HFA USE 2 INHALATIONS BY MOUTH EVERY 6 HOURS AS NEEDED FOR WHEEZING  OR SHORTNESS OF BREATH   aspirin  81 mg EC tablet Take 81 mg by mouth daily.   atenoloL 25 mg tablet Commonly known as: TENORMIN Take 1 tablet (25 mg total) by mouth daily.   baclofen 10 mg tablet Commonly known as: LIORESAL TAKE 1 TABLET BY MOUTH 2 TIMES DAILY AS NEEDED.   Breo Ellipta 100-25 mcg/dose inhaler Generic drug: fluticasone  furoate-vilanteroL USE 1 INHALATION BY MOUTH DAILY   cholecalciferol 25 mcg (1,000 unit) Cap Take 2 tablets daily to equal 2000 Units.   diclofenac sodium 1 % gel Commonly known as: VOLTAREN Apply 2 g topically 4 (four) times a day.   Eliquis  2.5 mg Tab Generic drug: apixaban  TAKE 1 TABLET BY MOUTH TWICE  DAILY   ezetimibe 10 mg tablet Commonly known as: ZETIA Take 1 tablet (10 mg total) by mouth daily.   famotidine  20 mg tablet Commonly known as: PEPCID  Take 20 mg by mouth daily.   fluticasone  propionate 50 mcg/spray nasal spray Commonly known as: FLONASE  Administer 1 spray into each nostril daily.   furosemide 20 mg tablet Commonly known as: LASIX TAKE 1 TABLET BY MOUTH DAILY   gabapentin  300 mg capsule Commonly known as: NEURONTIN  Take 1 capsule (300 mg total) by mouth 2 (two) times a day. DNFU 03/02/2024 Start taking on: March 02, 2024   loratadine 10 mg tablet Commonly known as: CLARITIN Take 1 tablet (10 mg total) by mouth  daily as needed for allergies.   LORazepam  1 mg tablet Commonly known as: ATIVAN  Take 1 tablet 30 minutes before procedure, may repeat once.   magnesium  oxide 400 mg (241 mg magnesium ) Tab Take 1 tablet (400 mg total) by mouth 2 (two) times a day.   miscellaneous medical supply Misc Seated roller walker.  ICD: R42, R26.89   multivitamin Cap Take 1 capsule by mouth Once Daily.   pantoprazole  40 mg EC tablet Commonly known as: PROTONIX  TAKE 1 TABLET BY MOUTH DAILY   potassium chloride  10 mEq ER tablet Commonly known as: KLOR-CON  TAKE 1 TABLET BY MOUTH 2 TIMES A DAY.   rosuvastatin  40 mg tablet Commonly known as: CRESTOR  TAKE 1 TABLET BY MOUTH DAILY   tirzepatide (weight loss) 2.5 mg/0.5 mL subcutaneous pen injector Commonly known as: ZEPBOUND Inject 0.5 mL (2.5 mg total) under the skin every 7 days.   zonisamide 100 mg capsule Commonly known as: ZONEGRAN Take 2 caps nightly      * * This list has 2 medication(s) that are the same as other medications prescribed for you. Read the directions carefully, and ask your doctor or other care provider to review them with you.           FOLLOW UP  No follow-up provider specified.  ____________________________ Scribe's Attestation: This document serves as a record of services personally performed by Rock Birmingham, MD. It was created on their behalf by Rock Dannielle Birmingham, MD, a trained medical scribe. The creation of this record is the provider's dictation and/or activities during the visit.   Electronically signed by: Rock Dannielle Birmingham, MD 02/06/2024 11:18 AM *Some images could not be shown.

## 2024-02-06 NOTE — Consults (Signed)
 [facility] Neurology  Hospital Consult Note  Referring Physician: Rock Dannielle Birmingham, MD;*   Chief Complaint/Reason for Consult:  I have been asked to see the patient in neurological consultation to render advice and opinion regarding concern for stroke.  HPI: [name] is a 68 y.o.  female with a history significant for report of prior stroke, sleep apnea, migraines, hyperlipidemia, DVT PE on Eliquis , COPD, vertigo who presented to the ED on the morning of 02/06/2024 with dizziness and right-sided weakness.  The patient is accompanied by her daughter in the room who supplements the history.  The patient reports that she has struggled from what she describes as vertigo for quite some time.  She reports symptoms were always there at a moderate level and are occasionally worsened by things like bending over in the shower, turning her head in the car, rolling over in bed.  Patient reports during about episode of vertigo, she will see double, and her family reports that during a bad episode her eyes seem to move erratically.  She has followed with both neurology as well as ENT in the outpatient setting though she reports she has not had any improvement in her vertiginous symptoms in that time.  Is been refractory to medications and PT and there was recommendation and her last neurology visit in May 2025 that she undergo video nystagmography (VNG) testing with ENT and audiology.  Patient reports that this morning, her dizziness was worse than normal, and she had multiple episodes today where she would be out of it and unresponsive.  She also developed right-sided weakness early this morning at least since taking a shower.  Patient has a nursing aide who was at home helping her in the bathroom, when she was noted to go unresponsive briefly with her eyes closed and her head slumped over.  Since that time, she has noticed numbness in her right face arm and leg as well as weakness in her right  arm and leg.  Of note during my evaluation in the emergency department, in the middle of my history taking, the patient suddenly looked to her daughter and said it is coming on, its going to happen after which she abruptly closed her eyes and stopped responding to any questions or commands.  She appeared comfortable during this episode which lasted approximately 2 to 3 minutes, as she lied in bed with eyes closed.  During this episode, I lifted her bilateral upper extremities above her head, and at first she arrested the drop of her arms onto her face and controlled their descent to the bed.  She also responded briskly to noxious stimuli in the bilateral lower extremities during this episode.  She held her eyes closed and resisted me opening them.  When her eyes were opened, her gaze was midline.  After approximately 2 to 3 minutes, she opened her eyes and resumed conversation.  She insisted that she did not remember the episode, though she told me she could hear me talking, and accurately describe the events that led up to the episode.  At no point during the episode was there any jerking or abnormal movements.  When questioned if this is the unresponsiveness that she is experiencing earlier today at the onset of symptoms, the patient's daughter answers in the affirmative.   In the ED, initial BP 123/73, afebrile, and saturating well on room air.  CBC with differential largely unremarkable.  CMP without significant electrolyte abnormalities.  Troponin negative.  EKG demonstrated sinus  rhythm with occasional PVCs and PACs with ventricular rate 76.  CT code stroke imaging was personally reviewed and did not demonstrate any acute intracranial hemorrhage or large hypodensities.  CTA did not demonstrate any proximal large vessel occlusions or hemodynamically significant stenosis.  CT perfusion was symmetric.   Medical History[1]  Surgical History[2] Allergies[3] Prior to Admission medications  Medication  Sig Start Date End Date Taking? Authorizing Provider  albuterol  0.63 mg/3 mL nebulizer solution Take 3 mL (0.63 mg total) by nebulization every 6 (six) hours as needed for wheezing. 11/23/22 01/14/24  Talitha Flatten, MD  albuterol  HFA (PROVENTIL  HFA;VENTOLIN  HFA;PROAIR  HFA) 90 mcg/actuation inhaler USE 2 INHALATIONS BY MOUTH EVERY 6 HOURS AS NEEDED FOR WHEEZING  OR SHORTNESS OF BREATH 02/13/23   Talitha Flatten, MD  aspirin  81 mg EC tablet Take 81 mg by mouth daily. 05/26/20   HISTORICAL PROVIDER, CONVERSION  atenoloL (TENORMIN) 25 mg tablet Take 1 tablet (25 mg total) by mouth daily. 08/07/23 08/06/24  Erna Creighton, MD  baclofen (LIORESAL) 10 mg tablet TAKE 1 TABLET BY MOUTH 2 TIMES DAILY AS NEEDED. 05/11/23   Talitha Flatten, MD  Breo Ellipta 100-25 mcg/dose dsdv inhaler USE 1 INHALATION BY MOUTH DAILY 08/28/23   Talitha Flatten, MD  cholecalciferol 25 mcg (1,000 unit) cap Take 2 tablets daily to equal 2000 Units. 12/02/21   Talitha Flatten, MD  diclofenac sodium (VOLTAREN) 1 % gel Apply 2 g topically 4 (four) times a day. 08/23/23 01/14/24  Luckricia Olivacce, PA-C  Eliquis  2.5 mg tab TAKE 1 TABLET BY MOUTH TWICE  DAILY 06/27/23   Selinda Vicenta Albino, MD  ezetimibe (ZETIA) 10 mg tablet Take 1 tablet (10 mg total) by mouth daily. 12/03/23   Talitha Flatten, MD  famotidine  (PEPCID ) 20 mg tablet Take 20 mg by mouth daily. 05/23/17   HISTORICAL PROVIDER, CONVERSION  fluticasone  propionate (FLONASE ) 50 mcg/spray nasal spray Administer 1 spray into each nostril daily. 11/16/22 01/14/24  Lauraine Dine, PA-C  furosemide (LASIX) 20 mg tablet TAKE 1 TABLET BY MOUTH DAILY 09/17/23   Talitha Flatten, MD  gabapentin  (NEURONTIN ) 300 mg capsule Take 1 capsule (300 mg total) by mouth 2 (two) times a day. DNFU 03/02/2024 03/02/24 06/10/24  Luckricia Olivacce, PA-C  loratadine (CLARITIN) 10 mg tablet Take 1 tablet (10 mg total) by mouth daily as needed for allergies. Patient not taking: Reported on 01/14/2024 12/17/22 10/19/23  Mary-Peyton Rosina Fetters, MD  LORazepam  (ATIVAN ) 1 mg  tablet Take 1 tablet 30 minutes before procedure, may repeat once. Patient not taking: Reported on 01/14/2024 12/14/23   Lirim Tonuzi, MD  magnesium  oxide 400 mg (241 mg magnesium ) tab Take 1 tablet (400 mg total) by mouth 2 (two) times a day. 03/06/23 03/05/24  Erna Creighton, MD  miscellaneous medical supply misc Seated roller walker.  ICD: R42, R26.89 12/29/22   Lirim Tonuzi, MD  multivitamin cap Take 1 capsule by mouth Once Daily. 05/23/17   HISTORICAL PROVIDER, CONVERSION  pantoprazole  (PROTONIX ) 40 mg EC tablet TAKE 1 TABLET BY MOUTH DAILY 11/26/23   Talitha Flatten, MD  potassium chloride  (KLOR-CON ) 10 mEq ER tablet TAKE 1 TABLET BY MOUTH 2 TIMES A DAY. 09/07/23   Erna Creighton, MD  rosuvastatin  (CRESTOR ) 40 mg tablet TAKE 1 TABLET BY MOUTH DAILY 12/25/23   Talitha Flatten, MD  tirzepatide, weight loss, (ZEPBOUND) 2.5 mg/0.5 mL subcutaneous pen injector Inject 0.5 mL (2.5 mg total) under the skin every 7 days. 12/27/23   Talitha Flatten, MD  zonisamide (ZONEGRAN) 100 mg capsule Take  2 caps nightly 12/11/23   Lirim Tonuzi, MD   Family History[4]      Scheduled Meds: [Held by provider] apixaban , 2.5 mg, oral, BID [START ON 02/07/2024] aspirin , 81 mg, oral, Daily aspirin , 325 mg, oral, Once budesonide , 0.5 mg, nebulization, RBID  And formoterol , 20 mcg, nebulization, RBID ezetimibe, 10 mg, oral, Daily [START ON 02/07/2024] famotidine , 20 mg, oral, Daily [Held by provider] furosemide, 20 mg, oral, Daily methylPREDNISolone  acetate, 80 mg, intramuscular, Once rosuvastatin , 40 mg, oral, At Bedtime   PRN Meds: .  acetaminophen  .  dextrose  .  dextrose    Review of Systems Pertinent items are noted in HPI.   Objective: Temp:  [97.7 F (36.5 C)] 97.7 F (36.5 C) Heart Rate:  [68-89] 71 Resp:  [15-22] 15 BP: (123-152)/(66-99) 135/80 O2 Device: None (Room air)    No intake or output data in the 24 hours ending 02/06/24 1457  Wt Readings from Last 3 Encounters:  02/06/24 106 kg (233 lb)  01/14/24 106 kg  (233 lb)  12/27/23 105 kg (232 lb)      Physical Exam:  GENERAL: Nervous appearing 68 year old female who appears older than stated age lying in bed EYES:   PERRL, full EOM ENT:   Moist oropharynx CARDIOVASCULAR:  Warm well perfused extremities RESPIRATORY:  Normal work of breathing GASTROINTESTINAL: Large protuberant abdomen is otherwise nondistended SKIN: no obvious rashes or lesions on areas of exposed skin  MENTAL STATUS EXAM:  Oriented x 4 Follows commands in all 4 extremities Speech is clear Language is fluent and intact to conversation   Names objects and repeat phrases without difficulty [Episode of altered consciousness during my exam as documented above in HPI]  CRANIAL NERVES:    CN 2 (Optic): VF intact to finger counting CN 3,4,6 (EOM): full extraocular movements CN 5 (Trigeminal): Diminished sensation to light touch right hemiface CN 7 (Facial): symmetric activation CN 8 (Auditory): Auditory acuity grossly normal.  CN 9,10 (Glossophar): Palate was not well-visualized CN 11 (spinal access): 5/5 shoulder shrug  CN 12 (Hypoglossal): tongue midline on protrusion   MOTOR: Full strength 5/5 throughout left hemibody.  In the right upper extremity, she demonstrates 4+/5 strength throughout, and when testing for drift, is able to hold the arm without drift for the first 6 to 7 seconds of testing followed by abrupt drift of the bed.  In the lower extremity, though she demonstrates only 4+/5 strength in the right lower extremity, she is able to maintain antigravity posture without drift.  REFLEXES: Deferred  COORDINATION:   Intact finger-to-nose without ataxia, though movements are slowed on the right without evidence of ataxia  SENSATION:   Intact to light touch x 4  GAIT: Deferred      NIH Stroke Scale:   NIH Stroke Scale: 3   Labs: Results from last 7 days  Lab Units 02/06/24 1036  WHITE BLOOD CELL COUNT 10*3/uL 6.70  HEMOGLOBIN g/dL 84.8  HEMATOCRIT % 54.9*   PLATELET COUNT 10*3/uL 225  MEAN CORPUSCULAR VOLUME fL 88.9   Results from last 7 days  Lab Units 02/06/24 1036  SODIUM mmol/L 141  POTASSIUM mmol/L 3.7  CHLORIDE mmol/L 111*  CO2 mmol/L 24  BUN mg/dL 12  CREATININE mg/dL 9.40*  CALCIUM  mg/dL 8.4*      Significant Diagnostic Studies: All images independently visualized.  Transthoracic echo (TTE) complete  Final Result by Gerhardt Mana, MD (07/09 1418)  Version  1                                                                                                          Study ID  [id number]                                                                     +--------------------------------------------------+                             +----------+                                                                                                                                                    [facility]                                                                                                                                 [facility]                                         +--------------------------------------------------+                                                                                                                                                   +----------+  [facility]  [address]  Transthoracic Echocardiogram Report  Name  MARCHETTA, NAVRATIL                     Study Date  02-06-2024,   1  32 PM                   Height  64.02 in  MRN  [id number]                                      Patient Location    [id number]                       Weight  233.002 lb  DOB  [birth year]  [id number]                        Birth Gender  Female                                 BSA  2.09 m  Age  19 Years                                       Ethnicity  3                                         BP  152 - 66 mmHg                                                                                                          HR  70 bpm  Reason For Study  Stroke  Ordering Physician  [facility], [place]  Performed By  [facility]  Referring Physician  [facility], [place]  PROCEDURE  A two-dimensional transthoracic echocardiogram with color flow and Doppler   was performed. Image  Quality  Technically difficult. Injection of agitated saline contrast   performed to evaluate for  possible shunt.  Injection of agitated saline contrast performed to evaluate for possible   shunt  The left ventricular size is normal.  Mild left ventricular hypertrophy  Left ventricular systolic function is normal.  LV ejection fraction = 60-65%.  The right ventricle is not well visualized.  The right ventricular systolic function is normal.  Injection of agitated saline showed no right-to-left shunt.  IVC size was normal.  There is no pericardial effusion.  No apparent valvular abnormalities.  LEFT VENTRICLE  Mild left ventricular hypertrophy. The left ventricular size is normal.   Left ventricular systolic  function is normal. LV ejection fraction = 60-65%.  RIGHT VENTRICLE  The right ventricle is not well visualized. The right ventricular systolic  function is normal.  LEFT ATRIUM  The left atrial size is normal.  RIGHT ATRIUM  Right atrial size is normal. Injection of agitated saline showed no   right-to-left shunt.  AORTIC VALVE  The aortic valve is not well visualized. There is no aortic stenosis.   There is no aortic  regurgitation.  MITRAL VALVE  The mitral valve leaflets appear normal. There is trace mitral   regurgitation.  TRICUSPID VALVE  The tricuspid valve is not well visualized. There is trace tricuspid   regurgitation.  PULMONIC VALVE  Structurally normal pulmonic valve. There is no pulmonic valvular   regurgitation. There is no  pulmonic  valvular stenosis.  ARTERIES  The aortic sinus is normal size.  VENOUS  IVC size was normal.  EFFUSION  There is no pericardial effusion.  MMode-2D Measurements & Calculations  EDV MOD-sp4   60.3 ml                 EF A4C  64.0 %                           ESV MOD-sp4   21.7 ml    IVSd  1.22 cm  LA dim  3.1 cm                         LA ESV  BP   38.2 ml                    LA ESV Index  A2C    13.1 ml-m        LA ESV Index  A4C   20.7 ml-m  LA ESV Index  BP   18.3 ml-m         LVIDd  3.9 cm                            LVIDs  2.8 cm            LVOT diam  1.94 cm  LVPWd  1.14 cm                        SV A4C  38.5 ml  Doppler Measurements & Calculations  Ao max PG  6.3 mmHg                    Ao mean PG  3.9 mmHg                    Ao V2 max  125.1 cm-sec  Ao V2 mean  96.3 cm-sec  Ao V2 VTI  28.2 cm                      AS Dimensionless Index  VTI   0.70      AVAi VTI   cm^2-m^2  0.99 cm           E-Lat E  8.8  E-Med E  9.7                          Lat Peak E  Vel  9.7 cm-sec             LV V1 VTI  19.7 cm        Med Peak E  Vel  8.8 cm-sec  MV A max vel  82.8 cm-sec              MV dec time  0.25 sec                    MV E max vel  85.4  cm-sec               SV index LVOT   27.9 ml-m  Other Measurements & Calculations  AVA  VTI   2.06 cm                  BSA  2.09 m                            MV E-A  1.03               SI MOD-sp4   18.5 ml-m  SV LVOT   58.2 ml                      SV MOD-sp4   38.6 ml  ___________________________________________________________________________  ___                                          MD Gerhardt Mana, MD, [phone]         02-06-2024, 2  18 PM    XR Chest 1 View  Final Result by Delmar Herbert Winfred Booker Results In [place] [id number] (07/09 1306)  CLINICAL DATA:  Syncope.    EXAM:  CHEST  1 VIEW    COMPARISON:  10/19/2023.    FINDINGS:  The heart size and mediastinal contours are unchanged. Minimal  bibasilar linear atelectasis. No focal  consolidation, pleural  effusion, or pneumothorax. No acute osseous abnormality.    IMPRESSION:  Minimal bibasilar linear atelectasis. Otherwise, no acute  cardiopulmonary findings.      Electronically Signed    By: Harrietta Sherry M.D.    On: 02/06/2024 13:06      CT Code Stroke W Perfusion  Final Result by Rockey Debby Childs, DO (07/09 1224)  CLINICAL DATA:  Provided history: Neuro deficit, acute, stroke  suspected. Dizziness. Headache. Right-sided weakness.    EXAM:  CT ANGIOGRAPHY HEAD AND NECK    CT PERFUSION BRAIN    TECHNIQUE:  Multidetector CT imaging of the head and neck was performed using  the standard protocol during bolus administration of intravenous  contrast. Multiplanar CT image reconstructions and MIPs were  obtained to evaluate the vascular anatomy. Carotid stenosis  measurements (when applicable) are obtained utilizing NASCET  criteria, using the distal internal carotid diameter as the  denominator.    Multiphase CT imaging of the brain was performed following IV bolus  contrast injection. Subsequent parametric perfusion maps were  calculated using RAPID software.    RADIATION DOSE REDUCTION: This exam was performed according to the  departmental dose-optimization program which includes automated  exposure control, adjustment of the mA and/or kV according to  patient size and/or use of iterative reconstruction technique.    CONTRAST:  100 mL iohexoL  (OMNIPAQUE ) 350 mg iodine/mL injection  (MDV) 100 mL    COMPARISON:  Non-contrast head CT performed earlier today  02/06/2024. CTA 12/13/2022.    FINDINGS:  CTA NECK FINDINGS    Aortic arch: Standard aortic branching. The visible thoracic aorta  is normal  in caliber. Minimal a sclerotic plaque within the proximal  left subclavian artery. No hemodynamically significant innominate or  proximal subclavian artery stenosis.    Right carotid system: CCA and ICA patent within the neck without  stenosis  or significant atherosclerotic disease.    Left carotid system: CCA and ICA patent within the neck without  stenosis or significant atherosclerotic disease. Partially  retropharyngeal course of the cervical ICA.    Vertebral arteries: Codominant and patent within the neck without  stenosis or significant atherosclerotic disease.    Skeleton: Spondylosis of the cervical and visualized upper thoracic  levels. Nonspecific chronic sclerotic focus within the T2 vertebral  body.    Other neck: Subcentimeter left thyroid lobe nodule not meeting  consensus criteria for ultrasound follow-up based on size. No  follow-up imaging recommended. Reference: J Am Coll Radiol. 2015  Feb;12(2): 143-50.    Upper chest: No consolidation within the imaged lung apices.    Review of the MIP images confirms the above findings    CTA HEAD FINDINGS    Anterior circulation:    The intracranial internal carotid arteries are patent. The M1 middle  cerebral arteries are patent. No M2 proximal branch occlusion or  high-grade proximal stenosis. The anterior cerebral arteries are  patent. Hypoplastic left A1 segment. No intracranial aneurysm is  identified.    Posterior circulation:    The intracranial vertebral arteries are patent. The basilar artery  is patent. The posterior cerebral arteries are patent. A right  posterior communicating artery is present. The left posterior  communicating artery is diminutive or absent.    Venous sinuses: Within the limitations of contrast timing, no  convincing thrombus.    Anatomic variants: As described.    Review of the MIP images confirms the above findings    CT Brain Perfusion Findings:    CBF (<30%) Volume: 0mL    Perfusion (Tmax>6.0s) volume: 0mL    Mismatch Volume: 0mL    Infarction Location: None identified    No emergent large vessel occlusion identified. These results were  called by telephone at the time of interpretation on 02/06/2024 at  12:22  pm to provider [facility] , who verbally acknowledged these  results.    IMPRESSION:  CTA neck:    The common carotid, internal carotid and vertebral arteries are  patent within neck without stenosis or significant atherosclerotic  disease.    CTA head:    No proximal intracranial large vessel occlusion or high-grade  proximal arterial stenosis identified.    CT perfusion head:    The perfusion software identifies no core infarct. The perfusion  software identifies no critically hypoperfused parenchyma (utilizing  the Tmax>6 seconds threshold). No mismatch volume reported.      Electronically Signed    By: Rockey Childs D.O.    On: 02/06/2024 12:24      CT Head WO Contrast  Final Result by Dorethia Gerhard Molt, MD (07/09 1112)  CLINICAL DATA:  Vertigo, dizziness    EXAM:  CT HEAD WITHOUT CONTRAST    TECHNIQUE:  Contiguous axial images were obtained from the base of the skull  through the vertex without intravenous contrast.    RADIATION DOSE REDUCTION: This exam was performed according to the  departmental dose-optimization program which includes automated  exposure control, adjustment of the mA and/or kV according to  patient size and/or use of iterative reconstruction technique.    COMPARISON:  02/14/2021    FINDINGS:  Brain: Normal anatomic configuration. Parenchymal  volume loss is  commensurate with the patient's age. Stable mild periventricular  white matter changes are present likely reflecting the sequela of  small vessel ischemia. No abnormal intra or extra-axial mass lesion  or fluid collection. No abnormal mass effect or midline shift. No  evidence of acute intracranial hemorrhage or infarct. Ventricular  size is normal. Cerebellum unremarkable.    Vascular: No asymmetric hyperdense vasculature at the skull base.    Skull: Intact    Sinuses/Orbits: Paranasal sinuses are clear. Orbits are  unremarkable.    Other: Mastoid air cells and middle ear  cavities are clear.    IMPRESSION:  1. No acute intracranial abnormality.  2. Stable mild periventricular white matter changes likely  reflecting the sequela of small vessel ischemia.      Electronically Signed    By: Dorethia Molt M.D.    On: 02/06/2024 11:12         Impression: [name] is a 68 y.o.  female with the above history who presented to the emergency department with worsened chronic dizziness from baseline, episodes of apparent unresponsiveness, and right-sided weakness.  The episode witnessed during my exam today had multiple hallmark features of a functional neurologic episode (eye closure, positive arm drop bilaterally) though is difficult to say with certainty.  I provided some reassurance to the patient and her daughter that the episode witnessed during my exam did not appear consistent with acute ischemic stroke or seizure.  Furthermore, some elements of her right-sided deficits are somewhat functional in nature as well.  Nonetheless, it is difficult to rule out acute ischemic stroke (especially posterior circulation stroke in this case) with my history and physical examination alone and thus reasonable to admit for stroke workup.  If episodes like that witnessed during my exam (as described in HPI) happen with regularity overnight, short course of LTM for spell capture is reasonable.  However, relatively low suspicion that the episodes represent seizure.  Regarding her dizziness, this is a chronic problem and unlikely to be resolved during this hospitalization.     Principal Problem:   Near syncope Active Problems:   HLD (hyperlipidemia)   Postural dizziness with near syncope   History of vertigo   History of pulmonary embolus (PE)   Recommendations: - Q4 neuroassessments and telemetry while in house - Permissive hypertension to 220/110 is reasonable for for 24 to 48 hours (though she is normotensive in the ED ) - MRI brain WO  - Out of an  abundance of caution, reasonable to hold her anticoagulation pending MRI - If MRI is negative overnight, can resume Eliquis  - Continue home Crestor , Zetia - Please obtain TTE - Lipid profile, A1c if not already obtained - Would benefit from PT/OT evaluations given persistent dizziness - As above, if episodes of unresponsiveness continue with regularity overnight, LTM for spell capture is reasonable despite relatively low suspicion of seizure -Neurology will follow along; please do not hesitate to reach out with additional questions or concerns   Best Regards,       [1] Past Medical History: Diagnosis Date  . Acute deep vein thrombosis (DVT) of popliteal vein of right lower extremity    (CMD) 06/14/2017   MONTH AFTER KNEE ARTHROSCOPY   . Acute hypoxemic respiratory failure    (CMD) 09/15/2019  . Acute tear lateral meniscus, right, subsequent encounter 05/14/2017  . Aftercare following surgery of the musculoskeletal system 06/14/2017  . Altered gait    uses walker  . Arthritis  generalized  . Arthritis of right knee 01/17/2018   Overview:  There is no evidence of DVT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

## 2024-02-09 NOTE — Discharge Summary (Signed)
 [facility] Medicine Discharge Summary   Demographics: Anita Benton  68 y.o. 1955/12/16 MRN: [id number]    Extended Emergency Contact Information Primary Emergency Contact: [facility] Home Phone: [phone] Mobile Phone: [phone] Relation: Daughter  Full Code  Admit Date: 02/06/2024                            Attending Physician: Stanly Oris, MD Discharge Date: 02/09/2024  Primary Care Provider: Talitha Flatten, MD   [phone]  Consults during this admission: Consult Orders             IP CONSULT TO NEUROLOGY       Specialty:  Neurology  Provider:  (Not yet assigned)      IP CONSULT TO HOSPITALIST       Provider:  (Not yet assigned)              Active & Resolved Diagnosis: Principal Problem:   Near syncope Active Problems:   HLD (hyperlipidemia)   History of vertigo   History of pulmonary embolus (PE) Resolved Problems:   * No resolved hospital problems. *   Disposition: Patient discharged to Home in stable condition.    Scheduled Future Appointments       Provider Department Dept Phone Center   02/11/2024 9:40 AM Eleanor GORMAN Gay [facility]  - Chronic Complex Care [place] [phone] [facility] [place]   02/13/2024 11:20 AM Eva Manus Gum [facility]  - Pulmonary [place] [phone] [facility] [place]   04/04/2024 9:20 AM Talitha Flatten [facility]  - Internal Medicine [place] [phone] [facility]   04/24/2024 3:20 PM Sharri Fortune [facility]  - Neurology [place] [phone] [facility] [place]   05/29/2024 11:30 AM Selinda Vicenta Albino [facility] [phone] Hematology Oncology [phone] [facility] [place]   07/03/2024 10:00 AM L[facility]im [facility]  - Neurology [place] [phone] [facility] [place]   08/12/2024 2:00 PM Erna Creighton [facility]  - Heart & Vascular [place]  [phone] [facility]       Hospital Course: HPI- 68 y.o. year old female Presented to [facility] emergency department today 02/06/2024 stating that she had an episode of vertigo and dizziness after taking a shower this morning that resulted in near syncope and double vision however no passing out or falls or any trauma.  Patient does report history of vertigo and per chart review it appears that she follows with neurology and ENT as outpatient and they have been unable to have any medical improvement in her dizziness over the last 2 to 3 months as seen in EMR.  Patient states that the dizziness episode this morning lasted longer than normal and continues to experience dizziness with double vision at time of arrival to the emergency department that has since subsided.  Patient reports she has a history of a previous stroke with residual right-sided weakness however did state that she felt her arm was weaker than normal this morning around 0 8:45 AM. She was ultimately concerned about stroke as she has had several previous CVAs and such-- leading her to come into ED for further w/u and evaluation. Denies any other complications except those listed above and denies aggravating or alleviate ing factors  Likely Migraine-  Has history of dizziness and vertigo follows with neurology and ENT as outpatient and  appears last seen in office in May of this year as reviewed in  EMR  Was referred for continued physical therapy follow-ups --Imaging-workup did not reveal any new acute changes -Underwent code stroke CTs which was negative for acute pathology, underwent MRI brain and MRA brain which was negative for acute stroke, noted chronic ischemic changes. -Patient had episode of dizziness while seen by neurology, neurology did not think patient has seizure or strokelike symptoms, patient has already workup done like Zio patch, echo has normal no wall motion abnormality, MRI brain negative acute  pathology.  - Patient had couple of episode in the hospital where start with headache then feels dizziness, doesn't passout, she can listen and understand but doesn't respond, episode last for less than a minute, after that she continue to have headache, could be migraine headache, she has been following outpatient with neurology, who has tried different medicine for migraine, currently she is on Zonegran. Pt was given  Toradol  couple of times and one-time migraine cocktail her symptoms significantly improved, resumed her gabapentin  and Zonegran on night before, this morning she is feeling much better, she walked in the hallway without dizziness or lightheaded or any new symptom.  She was discharged in a stable condition, advised to follow-up with primary care and neurologist.  Discussed with the daughters at bedside in detail, Pt has not been driving, advised not to drive till seen by neurologist   History of pulmonary embolus (PE) Hx of DVTs and PE in 2023, follows outpatient with Dr. Madison, -- Continue Eliquis  2.5 twice daily indefinite HLD (hyperlipidemia) --continue home statin        Temp:  [97.1 F (36.2 C)-98.3 F (36.8 C)] 97.7 F (36.5 C) Heart Rate:  [57-106] 97 Resp:  [15-20] 18 BP: (118-154)/(57-98) 118/77  Anticoagulant Medications     Direct Factor Xa Inhibitors Start End   * Eliquis  2.5 mg tab 06/27/2023 --   Sig - Route: TAKE 1 TABLET BY MOUTH TWICE  DAILY - oral   Notes to Pharmacy: Please send a replace/new response with 100-Day Supply if appropriate to maximize member benefit. Requesting 1 year supply.         Discharge Medications     Modified Medications      Sig Disp Refill Start End  baclofen 10 mg tablet Commonly known as: LIORESAL What changed: See the new instructions.  TAKE 1 TABLET BY MOUTH 2 TIMES DAILY AS NEEDED.  180 tablet  1     Breo Ellipta 100-25 mcg/dose inhaler Generic drug: fluticasone  furoate-vilanteroL What changed: See the new  instructions.  USE 1 INHALATION BY MOUTH DAILY  180 each  3     cholecalciferol 25 mcg (1,000 unit) Cap What changed: See the new instructions.  Take 2 tablets daily to equal 2000 Units.  180 capsule  3     rosuvastatin  40 mg tablet Commonly known as: CRESTOR  What changed: when to take this  TAKE 1 TABLET BY MOUTH DAILY  90 tablet  1         Medications To Continue      Sig Disp Refill Start End  * albuterol  0.63 mg/3 mL nebulizer solution  Take 3 mL (0.63 mg total) by nebulization every 6 (six) hours as needed for wheezing.  180 mL  1     * albuterol  HFA 90 mcg/actuation inhaler Commonly known as: PROVENTIL  HFA;VENTOLIN  HFA;PROAIR  HFA  USE 2 INHALATIONS BY MOUTH EVERY 6 HOURS AS NEEDED FOR WHEEZING  OR SHORTNESS OF  BREATH  18 g  11     Aleve 220 mg tablet Generic drug: naproxen sodium  Take 220 mg by mouth as needed.   0     aspirin  81 mg EC tablet  Take 81 mg by mouth daily.   0     atenoloL 25 mg tablet Commonly known as: TENORMIN  Take 1 tablet (25 mg total) by mouth daily.  90 tablet  3     diclofenac sodium 1 % gel Commonly known as: VOLTAREN  Apply 2 g topically 4 (four) times a day.  720 g  0     Eliquis  2.5 mg Tab Generic drug: apixaban   TAKE 1 TABLET BY MOUTH TWICE  DAILY  200 tablet  2     ezetimibe 10 mg tablet Commonly known as: ZETIA  Take 1 tablet (10 mg total) by mouth daily.  90 tablet  1     famotidine  20 mg tablet Commonly known as: PEPCID   Take 20 mg by mouth daily.   0     fluticasone  propionate 50 mcg/spray nasal spray Commonly known as: FLONASE   Administer 1 spray into each nostril daily.  9.9 mL  0     furosemide 20 mg tablet Commonly known as: LASIX  TAKE 1 TABLET BY MOUTH DAILY  90 tablet  1     gabapentin  300 mg capsule Commonly known as: NEURONTIN   Take 1 capsule (300 mg total) by mouth 2 (two) times a day. DNFU 03/02/2024  200 capsule  0  March 02, 2024    magnesium  oxide 400 mg (241 mg magnesium )  Tab  Take 1 tablet (400 mg total) by mouth 2 (two) times a day.  180 tablet  3     miscellaneous medical supply Misc  Seated roller walker.  ICD: R42, R26.89  1 each  0     multivitamin Cap  Take 1 capsule by mouth daily.   0     pantoprazole  40 mg EC tablet Commonly known as: PROTONIX   TAKE 1 TABLET BY MOUTH DAILY  90 tablet  1     potassium chloride  10 mEq ER tablet Commonly known as: KLOR-CON   TAKE 1 TABLET BY MOUTH 2 TIMES A DAY.  180 tablet  1     zonisamide 100 mg capsule Commonly known as: ZONEGRAN  Take 2 caps nightly  180 capsule  1        * * There are duplicate medications prescribed to the patient          Stopped Medications    loratadine 10 mg tablet Commonly known as: CLARITIN   LORazepam  1 mg tablet Commonly known as: ATIVAN    tirzepatide (weight loss) 2.5 mg/0.5 mL subcutaneous pen injector Commonly known as: ZEPBOUND       Discharge Orders     Full Code         Speech Therapy Recommendations: Rehab Potential Rehab Potential: Good     Lab Results  Component Value Date/Time   HGB 15.3 02/09/2024 02:27 AM   HCT 43.7 02/09/2024 02:27 AM   WBC 8.79 02/09/2024 02:27 AM   PLT 214 02/09/2024 02:27 AM   Lab Results  Component Value Date/Time   NA 141 02/09/2024 02:27 AM   K 3.4 02/09/2024 02:27 AM   CREATININE 0.74 02/09/2024 02:27 AM   BUN 17 02/09/2024 02:27 AM   GLUCOSE 113 (H) 02/09/2024 02:27 AM    Pertinent Imaging: MRA Neck W And WO Contrast  Final Result by  Delmar Herbert Winfred Booker Results In [place] [id number] (07/09 1956)  CLINICAL DATA:  Initial evaluation for acute near syncope.    EXAM:  MRA NECK WITHOUT AND WITH CONTRAST    MRA HEAD WITHOUT AND WITH CONTRAST    TECHNIQUE:  Multiplanar and multiecho pulse sequences of the neck were obtained  without and with intravenous contrast. Angiographic images of the  neck were obtained using MRA technique without and with intravenous  contast.; Angiographic images of the  Circle of Willis were obtained  using MRA technique without and with intravenous contrast.    CONTRAST:  10 mL gadobutroL (GADAVIST) INTRAVENOUS SOLUTION (SDV)  soln 10 mL    COMPARISON:  Prior CT from earlier the same day.    FINDINGS:  MRA NECK FINDINGS    Aortic arch: Visualized aortic arch within normal limits for caliber  with standard 3 vessel morphology. No stenosis or other abnormality  about the origin the great vessels.    Right carotid system: Right common and internal carotid arteries are  patent with antegrade flow. No evidence for dissection. No  hemodynamically significant stenosis about the right carotid artery  system.    Left carotid system: Left common and internal carotid arteries are  patent with antegrade flow. No evidence for dissection. No  hemodynamically significant stenosis about the left carotid artery  system.    Vertebral arteries: Both vertebral arteries arise from the  subclavian arteries. No visible proximal subclavian artery stenosis.  Vertebral arteries are patent with antegrade flow. No stenosis or  dissection.    Other: None.    MRA HEAD FINDINGS    Anterior circulation: Both internal carotid arteries are widely  patent through the siphons without stenosis or other abnormality. A1  segments patent bilaterally. Normal anterior communicating complex.  Anterior cerebral arteries patent without significant stenosis. No  M1 stenosis or occlusion. Distal MCA branches perfused and  symmetric.    Posterior circulation: Both V4 segments patent without significant  stenosis. Left vertebral artery slightly dominant. Left PICA patent.  Right PICA origin not visualized. Basilar patent without stenosis.  Superior cerebellar and posterior cerebral arteries patent  bilaterally.    Anatomic variants: None significant.  No    Other: No intracranial aneurysm or other vascular abnormality.    IMPRESSION:  Normal MRA of the head and neck. No large  vessel occlusion,  hemodynamically significant stenosis, or other acute vascular  abnormality.      Electronically Signed    By: Morene Hoard M.D.    On: 02/06/2024 19:56      MRA Head WO Contrast  Final Result by [employer] Results In [place] [id number] (07/09 1956)  CLINICAL DATA:  Initial evaluation for acute near syncope.    EXAM:  MRA NECK WITHOUT AND WITH CONTRAST    MRA HEAD WITHOUT AND WITH CONTRAST    TECHNIQUE:  Multiplanar and multiecho pulse sequences of the neck were obtained  without and with intravenous contrast. Angiographic images of the  neck were obtained using MRA technique without and with intravenous  contast.; Angiographic images of the Circle of Willis were obtained  using MRA technique without and with intravenous contrast.    CONTRAST:  10 mL gadobutroL (GADAVIST) INTRAVENOUS SOLUTION (SDV)  soln 10 mL    COMPARISON:  Prior CT from earlier the same day.    FINDINGS:  MRA NECK FINDINGS    Aortic arch: Visualized aortic arch within normal limits for caliber  with standard 3 vessel morphology. No stenosis  or other abnormality  about the origin the great vessels.    Right carotid system: Right common and internal carotid arteries are  patent with antegrade flow. No evidence for dissection. No  hemodynamically significant stenosis about the right carotid artery  system.    Left carotid system: Left common and internal carotid arteries are  patent with antegrade flow. No evidence for dissection. No  hemodynamically significant stenosis about the left carotid artery  system.    Vertebral arteries: Both vertebral arteries arise from the  subclavian arteries. No visible proximal subclavian artery stenosis.  Vertebral arteries are patent with antegrade flow. No stenosis or  dissection.    Other: None.    MRA HEAD FINDINGS    Anterior circulation: Both internal carotid arteries are widely  patent through the siphons without stenosis or  other abnormality. A1  segments patent bilaterally. Normal anterior communicating complex.  Anterior cerebral arteries patent without significant stenosis. No  M1 stenosis or occlusion. Distal MCA branches perfused and  symmetric.    Posterior circulation: Both V4 segments patent without significant  stenosis. Left vertebral artery slightly dominant. Left PICA patent.  Right PICA origin not visualized. Basilar patent without stenosis.  Superior cerebellar and posterior cerebral arteries patent  bilaterally.    Anatomic variants: None significant.  No    Other: No intracranial aneurysm or other vascular abnormality.    IMPRESSION:  Normal MRA of the head and neck. No large vessel occlusion,  hemodynamically significant stenosis, or other acute vascular  abnormality.      Electronically Signed    By: Morene Hoard M.D.    On: 02/06/2024 19:56      Transthoracic echo (TTE) complete  Final Result by Gerhardt Mana, MD (07/09 1418)                                                                                                          Version  1                                                                                                          Study ID  [id number]                                                                     +--------------------------------------------------+                             +----------+  [facility]                                                                                                                                 [facility]                                         +--------------------------------------------------+                                                                                                                                                    +----------+  [facility]  [address]  Transthoracic Echocardiogram Report  Name  YASHICA, STERBENZ                     Study Date  02-06-2024,   1  32 PM                   Height  64.02 in  MRN  [id number]                                      Patient Location    [facility]                       Weight  233.002 lb  DOB  1956-01-25  [id number]                        Birth Gender  Female                                 BSA  2.09 m  Age  48 Years                                       Ethnicity  3  BP  152 - 66 mmHg                                                                                                          HR  70 bpm  Reason For Study  Stroke  Ordering Physician  [facility] [place]  Performed By  [facility]  Referring Physician  [facility] [place]  PROCEDURE  A two-dimensional transthoracic echocardiogram with color flow and Doppler   was performed. Image  Quality  Technically difficult. Injection of agitated saline contrast   performed to evaluate for  possible shunt.  Injection of agitated saline contrast performed to evaluate for possible   shunt  The left ventricular size is normal.  Mild left ventricular hypertrophy  Left ventricular systolic function is normal.  LV ejection fraction = 60-65%.  The right ventricle is not well visualized.  The right ventricular systolic function is normal.  Injection of agitated saline showed no right-to-left shunt.  IVC size was normal.  There is no pericardial effusion.  No apparent valvular abnormalities.  LEFT VENTRICLE  Mild left ventricular hypertrophy. The left ventricular size is normal.   Left ventricular systolic  function is normal. LV ejection fraction = 60-65%.  RIGHT VENTRICLE  The right ventricle is not well visualized. The right ventricular systolic   function is normal.  LEFT ATRIUM  The left atrial size is normal.  RIGHT ATRIUM   Right atrial size is normal. Injection of agitated saline showed no   right-to-left shunt.  AORTIC VALVE  The aortic valve is not well visualized. There is no aortic stenosis.   There is no aortic  regurgitation.  MITRAL VALVE  The mitral valve leaflets appear normal. There is trace mitral   regurgitation.  TRICUSPID VALVE  The tricuspid valve is not well visualized. There is trace tricuspid   regurgitation.  PULMONIC VALVE  Structurally normal pulmonic valve. There is no pulmonic valvular   regurgitation. There is no  pulmonic valvular stenosis.  ARTERIES  The aortic sinus is normal size.  VENOUS  IVC size was normal.  EFFUSION  There is no pericardial effusion.  MMode-2D Measurements & Calculations  EDV MOD-sp4   60.3 ml                 EF A4C  64.0 %                           ESV MOD-sp4   21.7 ml    IVSd  1.22 cm  LA dim  3.1 cm                         LA ESV  BP   38.2 ml                    LA ESV Index  A2C    13.1 ml-m        LA ESV Index  A4C   20.7 ml-m  LA ESV Index  BP  18.3 ml-m         LVIDd  3.9 cm                            LVIDs  2.8 cm            LVOT diam  1.94 cm  LVPWd  1.14 cm                        SV A4C  38.5 ml  Doppler Measurements & Calculations  Ao max PG  6.3 mmHg                    Ao mean PG  3.9 mmHg                    Ao V2 max  125.1 cm-sec  Ao V2 mean  96.3 cm-sec  Ao V2 VTI  28.2 cm                      AS Dimensionless Index  VTI   0.70      AVAi VTI   cm^2-m^2  0.99 cm           E-Lat E  8.8  E-Med E  9.7                          Lat Peak E  Vel  9.7 cm-sec             LV V1 VTI  19.7 cm        Med Peak E  Vel  8.8 cm-sec  MV A max vel  82.8 cm-sec              MV dec time  0.25 sec                    MV E max vel  85.4  cm-sec               SV index LVOT   27.9 ml-m  Other Measurements & Calculations  AVA  VTI   2.06 cm                  BSA  2.09 m                            MV E-A  1.03               SI MOD-sp4   18.5  ml-m  SV LVOT   58.2 ml                      SV MOD-sp4   38.6 ml  ___________________________________________________________________________  ___                                          MD Gerhardt Mana, MD, [phone]         02-06-2024, 2  18 PM    XR Chest 1 View  Final Result by Delmar Herbert Winfred Booker Results In [place] [id number] (07/09 1306)  CLINICAL DATA:  Syncope.    EXAM:  CHEST  1 VIEW    COMPARISON:  10/19/2023.    FINDINGS:  The heart size and mediastinal contours are unchanged. Minimal  bibasilar linear atelectasis. No focal consolidation, pleural  effusion, or pneumothorax. No acute osseous abnormality.    IMPRESSION:  Minimal bibasilar linear atelectasis. Otherwise, no acute  cardiopulmonary findings.      Electronically Signed    By: Harrietta Sherry M.D.    On: 02/06/2024 13:06      CT Code Stroke W Perfusion  Final Result by Rockey Debby Childs, DO (07/09 1224)  CLINICAL DATA:  Provided history: Neuro deficit, acute, stroke  suspected. Dizziness. Headache. Right-sided weakness.    EXAM:  CT ANGIOGRAPHY HEAD AND NECK    CT PERFUSION BRAIN    TECHNIQUE:  Multidetector CT imaging of the head and neck was performed using  the standard protocol during bolus administration of intravenous  contrast. Multiplanar CT image reconstructions and MIPs were  obtained to evaluate the vascular anatomy. Carotid stenosis  measurements (when applicable) are obtained utilizing NASCET  criteria, using the distal internal carotid diameter as the  denominator.    Multiphase CT imaging of the brain was performed following IV bolus  contrast injection. Subsequent parametric perfusion maps were  calculated using RAPID software.    RADIATION DOSE REDUCTION: This exam was performed according to the  departmental dose-optimization program which includes automated  exposure control, adjustment of the mA and/or kV according to  patient size and/or use of iterative reconstruction  technique.    CONTRAST:  100 mL iohexoL  (OMNIPAQUE ) 350 mg iodine/mL injection  (MDV) 100 mL    COMPARISON:  Non-contrast head CT performed earlier today  02/06/2024. CTA 12/13/2022.    FINDINGS:  CTA NECK FINDINGS    Aortic arch: Standard aortic branching. The visible thoracic aorta  is normal in caliber. Minimal a sclerotic plaque within the proximal  left subclavian artery. No hemodynamically significant innominate or  proximal subclavian artery stenosis.    Right carotid system: CCA and ICA patent within the neck without  stenosis or significant atherosclerotic disease.    Left carotid system: CCA and ICA patent within the neck without  stenosis or significant atherosclerotic disease. Partially  retropharyngeal course of the cervical ICA.    Vertebral arteries: Codominant and patent within the neck without  stenosis or significant atherosclerotic disease.    Skeleton: Spondylosis of the cervical and visualized upper thoracic  levels. Nonspecific chronic sclerotic focus within the T2 vertebral  body.    Other neck: Subcentimeter left thyroid lobe nodule not meeting  consensus criteria for ultrasound follow-up based on size. No  follow-up imaging recommended. Reference: J Am Coll Radiol. 2015  Feb;12(2): 143-50.    Upper chest: No consolidation within the imaged lung apices.    Review of the MIP images confirms the above findings    CTA HEAD FINDINGS    Anterior circulation:    The intracranial internal carotid arteries are patent. The M1 middle  cerebral arteries are patent. No M2 proximal branch occlusion or  high-grade proximal stenosis. The anterior cerebral arteries are  patent. Hypoplastic left A1 segment. No intracranial aneurysm is  identified.    Posterior circulation:    The intracranial vertebral arteries are patent. The basilar artery  is patent. The posterior cerebral arteries are patent. A right  posterior communicating artery is present. The left  posterior  communicating artery is diminutive or absent.    Venous sinuses: Within the limitations of contrast timing, no  convincing thrombus.    Anatomic variants: As described.    Review of the  MIP images confirms the above findings    CT Brain Perfusion Findings:    CBF (<30%) Volume: 0mL    Perfusion (Tmax>6.0s) volume: 0mL    Mismatch Volume: 0mL    Infarction Location: None identified    No emergent large vessel occlusion identified. These results were  called by telephone at the time of interpretation on 02/06/2024 at  12:22 pm to provider [facility] , who verbally acknowledged these  results.    IMPRESSION:  CTA neck:    The common carotid, internal carotid and vertebral arteries are  patent within neck without stenosis or significant atherosclerotic  disease.    CTA head:    No proximal intracranial large vessel occlusion or high-grade  proximal arterial stenosis identified.    CT perfusion head:    The perfusion software identifies no core infarct. The perfusion  software identifies no critically hypoperfused parenchyma (utilizing  the Tmax>6 seconds threshold). No mismatch volume reported.      Electronically Signed    By: Rockey Childs D.O.    On: 02/06/2024 12:24      CT Head WO Contrast  Final Result by Dorethia Gerhard Molt, MD (07/09 1112)  CLINICAL DATA:  Vertigo, dizziness    EXAM:  CT HEAD WITHOUT CONTRAST    TECHNIQUE:  Contiguous axial images were obtained from the base of the skull  through the vertex without intravenous contrast.    RADIATION DOSE REDUCTION: This exam was performed according to the  departmental dose-optimization program which includes automated  exposure control, adjustment of the mA and/or kV according to  patient size and/or use of iterative reconstruction technique.    COMPARISON:  02/14/2021    FINDINGS:  Brain: Normal anatomic configuration. Parenchymal volume loss is  commensurate with the patient's  age. Stable mild periventricular  white matter changes are present likely reflecting the sequela of  small vessel ischemia. No abnormal intra or extra-axial mass lesion  or fluid collection. No abnormal mass effect or midline shift. No  evidence of acute intracranial hemorrhage or infarct. Ventricular  size is normal. Cerebellum unremarkable.    Vascular: No asymmetric hyperdense vasculature at the skull base.    Skull: Intact    Sinuses/Orbits: Paranasal sinuses are clear. Orbits are  unremarkable.    Other: Mastoid air cells and middle ear cavities are clear.    IMPRESSION:  1. No acute intracranial abnormality.  2. Stable mild periventricular white matter changes likely  reflecting the sequela of small vessel ischemia.      Electronically Signed    By: Dorethia Molt M.D.    On: 02/06/2024 11:12        Electronically signed by: Stanly Oris, MD 02/09/2024 11:30 AM   Time spent on discharge: 40 min   *Some images could not be shown.

## 2024-02-10 NOTE — ED Provider Notes (Signed)
 [facility] Emergency Department Physician Note  ED Course - HPI and Medical Decision Making  Chief Complaint: No chief complaint on file.   History is obtained from Patient and EMS/Paramedics HPI: This is a 68 y.o. female who presents to the emergency department with two episodes of syncope reported today. She initially reported syncope, but talking with her and family, she states she is awake during these episodes, but feels very dizzy and lightheaded. Denies chest pain, dyspnea, vomiting, diarrhea, abdominal pain, back pain, headache, vision changes. Family member did state when she had an episode earlier she felt as though she couldn't see, but this resolved. She is currently asymptomatic.  MDM and Assessment: -Differential diagnosis includes, but not limited to: IIK:AEEC, labyrinthitis, Meniere's, Brainstem infarction, CVA, migraine, MS, Carotid sinus hypersensitivity, otitis media, post-concussive syndrome, mediations, hyperventilation, VIII nerve tumor, acoustic neuroma, cerebellar tumor, neurofibromatosis, MI, ACS, HTN emergency, UTI, infection, AKI, dehydration, orthostasis, electrolyte abnormalities and DDX: orthostatic, hypovolemia, vasovagal, arrhythmia, MI, HOCM, AAA/dissection, migraine, CVA/TIA, ectopic pregnancy, SAH, PE, seizure, hypoglycemia, carotid stenosis, medication, GI bleed, hypoxia, anemia   -Initial plan: Place patient on telemetry with continuous pulse oximeter reading and vital signs per emergency protocol, ECG to evaluate for high grade block, arrhythmia, ischemic changes, CBC to evaluate cell lines for anemia, thrombocytopenia, leukocytosis, leukopenia, CMP to evaluate electrolyte derangements, renal function, liver function, Cardiac indices to evaluate for potential myocardial ischemia or strain, and CT Head to evaluate for acute intracranial abnormality including SAH, IPH, SDH, masses, hydrocephalus  -Cardiac telemetry ordered for  continuous cardiac and hemodynamic monitoring; reviewed telemetry showing Normal sinus rhythm without arrhythmia  -Patient presents with lightheadedness and dizziness.  She has history of the same, was just admitted to the hospital for the same with significant work-up including a code stroke activation, admission, neurology consultation, MRI/MRA of the brain with no acute causes identified.  I was called into the room for an episode described by family. When I entered she was laying with her eyes closed, but would respond to painful stimuli. She had some rapid movements of her right arm, that were distractible; I do not think this was a focal seizure. She would rapidly wave her hand and reach for the bed rail, almost in a state that she was reaching to stabilize herself from potential dizziness. These symptoms quickly resolved. I did not see any arrhythmia on the monitor.  Her ECG here is non-ischemic, her troponins are negative. 3 hours on telemetry revealed no arrhythmias. She reported a posterior headache, a CT head shows no acute intracranial abnormality. She has multiple imaging modalities for these symptoms over the past 3 days including -Ct head noncontrast that showed stable mild periventricular white matter changes CTA that showed no LVO or proximal high grade stenoses CTP that showed no core infaraact or perfusion mismatch MRA head showing normal MRA MRA neck that was normal MRI brain with mild to moderate nonspecific white matter disease likely reflecting ischemic small vessel disease  She was treated for migraine with compazine , benadryl , toradol , tylenol  and fluids; her headache resolved. She had no further episodes. Question possible vestibular migraine; she has hx of ?Menieres and has seen ENT.  I do not think she would benefit from another hospital admission  On reevaluation, patient appears well. Afebrile, hemodynamically stable, sPO2 stable, and is stable for discharge home.  Follow-up was recommended and return precautions discussed.   Medical Decision Making Problems Addressed: Complicated migraine: complicated acute illness or injury Syncope, unspecified syncope  type: complicated acute illness or injury  Amount and/or Complexity of Data Reviewed Labs: ordered. Radiology: ordered. ECG/medicine tests: ordered.  Risk OTC drugs. Prescription drug management.    Factors Impacting ED Encounter Complexity: Decision regarding hospitalization or escalation of hospital level care and Discussion regarding prescription drug management  Impression  Patient's presentation is most consistent with acute presentation with potential threat to life or bodily function. 1. Syncope, unspecified syncope type   2. Complicated migraine    ED Disposition     ED Disposition  Discharge   Condition  Stable   Comment  --        Follow-up Talitha Flatten, [place] [phone] [place] [address] [phone]     Review of Systems  A pertinent review of systems was obtained and was negative except as noted in the HPI and MDM. Physical Exam   ED Triage Vitals  Temp 02/10/24 1924 98 F (36.7 C)  Heart Rate 02/10/24 1924 70  Resp 02/10/24 1924 18  BP 02/10/24 1924 117/73  MAP (mmHg) 02/10/24 2000 93  SpO2 02/10/24 1924 95 %  O2 Device 02/10/24 1924 None (Room air)  O2 Flow Rate (L/min) --   Weight 02/10/24 1924 106 kg (233 lb)   Physical Exam  Physical Exam Constitutional Nursing notes reviewed Vital signs reviewed  HEENT No obvious trauma Supple without meningismus Pupils cross midline No scleral icterus or injection  Respiratory Effort normal CTAB No respiratory distress  CV Normal rate No pitting edema  Abdomen Soft Non-tender Non-distended No peritonitis  MSK Atraumatic No obvious deformity ROM appropriate  Skin Warm Dry  Neuro Mental status: A/Ox3 CN II-XII intact.  Sensation intact to sharp/dull differentiation in all  extremities. Motor: Normal tone and bulk. No abnormal movements appreciated. No pronator drift. Strength tested and 5/5 in bilateral elbow flexion/extension, shoulder abduction, knee flexion/extension, ankle dorsiflexion/plantarflexion.  Coordination: Finger to nose and heel to shin testing intact bilaterally. Reflexes: Patellar reflexes WNL and symmetric bilaterally  Psychiatric Mood and affect normal    Other pertinent exam findings per MDM-ED Course Procedures  Procedures

## 2024-02-13 ENCOUNTER — Other Ambulatory Visit: Payer: Self-pay

## 2024-02-13 ENCOUNTER — Emergency Department

## 2024-02-13 ENCOUNTER — Emergency Department: Admission: EM | Admit: 2024-02-13 | Discharge: 2024-02-13 | Disposition: A | Discharge status: Home / Self Care

## 2024-02-13 ENCOUNTER — Encounter: Payer: Self-pay | Admitting: Emergency Medicine

## 2024-02-13 DIAGNOSIS — M25511 Pain in right shoulder: Secondary | ICD-10-CM | POA: Diagnosis not present

## 2024-02-13 DIAGNOSIS — R55 Syncope and collapse: Secondary | ICD-10-CM | POA: Insufficient documentation

## 2024-02-13 DIAGNOSIS — Z7901 Long term (current) use of anticoagulants: Secondary | ICD-10-CM | POA: Insufficient documentation

## 2024-02-13 DIAGNOSIS — J449 Chronic obstructive pulmonary disease, unspecified: Secondary | ICD-10-CM | POA: Insufficient documentation

## 2024-02-13 DIAGNOSIS — R404 Transient alteration of awareness: Secondary | ICD-10-CM

## 2024-02-13 LAB — COMPREHENSIVE METABOLIC PANEL WITH GFR
ALT: 14 U/L (ref 0–44)
AST: 20 U/L (ref 15–41)
Albumin: 3.4 g/dL — ABNORMAL LOW (ref 3.5–5.0)
Alkaline Phosphatase: 64 U/L (ref 38–126)
Anion gap: 8 (ref 5–15)
BUN: 19 mg/dL (ref 8–23)
CO2: 24 mmol/L (ref 22–32)
Calcium: 9 mg/dL (ref 8.9–10.3)
Chloride: 110 mmol/L (ref 98–111)
Creatinine, Ser: 0.7 mg/dL (ref 0.44–1.00)
GFR, Estimated: >60 mL/min (ref >60)
Glucose, Bld: 106 mg/dL — ABNORMAL HIGH (ref 70–99)
Potassium: 3.8 mmol/L (ref 3.5–5.1)
Sodium: 142 mmol/L (ref 135–145)
Total Bilirubin: 0.6 mg/dL (ref 0.0–1.2)
Total Protein: 6.5 g/dL (ref 6.5–8.1)

## 2024-02-13 LAB — CBC
HCT: 45.2 % (ref 36.0–46.0)
Hemoglobin: 15 g/dL (ref 12.0–15.0)
MCH: 30.1 pg (ref 26.0–34.0)
MCHC: 33.2 g/dL (ref 30.0–36.0)
MCV: 90.6 fL (ref 80.0–100.0)
Platelets: 242 K/uL (ref 150–400)
RBC: 4.99 MIL/uL (ref 3.87–5.11)
RDW: 13.5 % (ref 11.5–15.5)
WBC: 7.6 K/uL (ref 4.0–10.5)
nRBC: 0 % (ref 0.0–0.2)

## 2024-02-13 LAB — URINALYSIS, ROUTINE W REFLEX MICROSCOPIC
Bilirubin Urine: NEGATIVE
Glucose, UA: NEGATIVE mg/dL
Ketones, ur: NEGATIVE mg/dL
Nitrite: NEGATIVE
Protein, ur: NEGATIVE mg/dL
Specific Gravity, Urine: 1.012 (ref 1.005–1.030)
pH: 5 (ref 5.0–8.0)

## 2024-02-13 LAB — TROPONIN I (HIGH SENSITIVITY): Troponin I (High Sensitivity): 5 ng/L (ref <18)

## 2024-02-13 MED ORDER — LORAZEPAM 1 MG PO TABS
1.0000 mg | ORAL_TABLET | Freq: Once | ORAL | Status: AC
  Administered 2024-02-13: 1 mg via ORAL
  Filled 2024-02-13: qty 1

## 2024-02-13 MED ORDER — ACETAMINOPHEN 325 MG PO TABS
650.0000 mg | ORAL_TABLET | Freq: Once | ORAL | Status: AC
  Administered 2024-02-13: 650 mg via ORAL
  Filled 2024-02-13: qty 2

## 2024-02-13 MED ORDER — IBUPROFEN 600 MG PO TABS
600.0000 mg | ORAL_TABLET | Freq: Once | ORAL | Status: AC
  Administered 2024-02-13: 600 mg via ORAL
  Filled 2024-02-13: qty 1

## 2024-02-13 NOTE — ED Provider Notes (Signed)
 [facility] Provider Note    Event Date/Time   First MD Initiated Contact with Patient 02/13/24 1217     (approximate)   History   Loss of Consciousness   HPI  Anita Benton is a 68 y.o. female  with a history significant for report of prior stroke, sleep apnea, migraines, hyperlipidemia, DVT PE on Eliquis , COPD, vertigo who presents to the ED after an episode of unresponsiveness and right shoulder pain.  Patient reports a long history of episodes of unresponsiveness including multiple workups at an outside facility.  Today she was sitting in a chair and reportedly had another episode in front of the family member.  She arrives by private vehicle.  She reports hitting her head against the chair and her right shoulder and subsequently has pain in both areas.  Denies any new chest pain shortness of breath abdominal pain.  Reports compliance with home medications.  Since being discharged from the hospital on 02/09/2024 she had we presented to the emergency department on 02/10/2024 and work up at that time was unremarkable.  She saw her primary care physician on 02/11/2024 and reports that she is neck scheduled to see her neurologist in September.  She reports compliance with all medications.  Pulled prior documentation patient has already had echocardiogram, ZIO monitor MRI/MRAs of the brain with no causes identified  --    Per OSH D/c summary on 02/09/24  HPI- 68 y.o. year old female Presented to [facility] emergency department today 02/06/2024 stating that she had an episode of vertigo and dizziness after taking a shower this morning that resulted in near syncope and double vision however no passing out or falls or any trauma.  Patient does report history of vertigo and per chart review it appears that she follows with neurology and ENT as outpatient and they have been unable to have any medical improvement in her dizziness over the last 2 to 3 months as  seen in EMR.  Patient states that the dizziness episode this morning lasted longer than normal and continues to experience dizziness with double vision at time of arrival to the emergency department that has since subsided.  Patient reports she has a history of a previous stroke with residual right-sided weakness however did state that she felt her arm was weaker than normal this morning around 0 8:45 AM. She was ultimately concerned about stroke as she has had several previous CVAs and such-- leading her to come into ED for further w/u and evaluation. Denies any other complications except those listed above and denies aggravating or alleviate ing factors   Likely Migraine-  Has history of dizziness and vertigo follows with neurology and ENT as outpatient and appears last seen in office in May of this year as reviewed in  EMR  Was referred for continued physical therapy follow-ups --Imaging-workup did not reveal any new acute changes -Underwent code stroke CTs which was negative for acute pathology, underwent MRI brain and MRA brain which was negative for acute stroke, noted chronic ischemic changes. -Patient had episode of dizziness while seen by neurology, neurology did not think patient has seizure or strokelike symptoms, patient has already workup done like Zio patch, echo has normal no wall motion abnormality, MRI brain negative acute pathology.  - Patient had couple of episode in the hospital where start with headache then feels dizziness, doesn't passout, she can listen and understand but doesn't respond, episode last for less than a minute, after that  she continue to have headache, could be migraine headache, she has been following outpatient with neurology, who has tried different medicine for migraine, currently she is on Zonegran. Pt was given  Toradol  couple of times and one-time migraine cocktail her symptoms significantly improved, resumed her gabapentin  and Zonegran on night before, this  morning she is feeling much better, she walked in the hallway without dizziness or lightheaded or any new symptom.  She was discharged in a stable condition, advised to follow-up with primary care and neurologist.  Discussed with the daughters at bedside in detail, Pt has not been driving, advised not to drive till seen by neurologist   Physical Exam   Triage Vital Signs: ED Triage Vitals [02/13/24 1205]  Encounter Vitals Group     BP 114/70     Girls Systolic BP Percentile      Girls Diastolic BP Percentile      Boys Systolic BP Percentile      Boys Diastolic BP Percentile      Pulse Rate (!) 57     Resp 17     Temp 98.5 F (36.9 C)     Temp Source Oral     SpO2 97 %     Weight 233 lb (105.7 kg)     Height 5' 4 (1.626 m)     Head Circumference      Peak Flow      Pain Score 8     Pain Loc      Pain Education      Exclude from Growth Chart     Most recent vital signs: Vitals:   02/13/24 1500 02/13/24 1525  BP: 121/71   Pulse: 63   Resp:    Temp:  97.7 F (36.5 C)  SpO2: 98%     Nursing Triage Note reviewed. Vital signs reviewed and patients oxygen saturation is normoxic  General: Patient is well nourished, well developed, awake and alert, resting comfortably in no acute distress Head: Normocephalic and atraumatic, no scalp abrasions hematomas or lacerations Eyes: Normal inspection, extraocular muscles intact, no conjunctival pallor Ear, nose, throat: Normal external exam Neck: Normal range of motion Respiratory: Patient is in no respiratory distress, lungs CTAB Cardiovascular: Patient is not tachycardic, RR GI: Abd SNT with no guarding or rebound  Back: Normal inspection of the back with good strength and range of motion throughout all ext Extremities: pulses intact with good cap refills, patient has mild bilateral ankle edema which she reports is chronic for her but no other lower extremity edema or tenderness to palpation.  Right shoulder with very mild  tenderness to palpation over the proximal humerus head but no ecchymosis and full range of motion Neuro: The patient is alert and oriented to person, place, and time, appropriately conversive, with 5/5 bilat UE/LE strength, no gross motor or sensory defects noted. Coordination appears to be adequate.  She is able to ambulate without abnormality Skin: Warm, dry, and intact Psych: normal mood and affect, no SI or HI  ED Results / Procedures / Treatments   Labs (all labs ordered are listed, but only abnormal results are displayed) Labs Reviewed  COMPREHENSIVE METABOLIC PANEL WITH GFR - Abnormal; Notable for the following components:      Result Value   Glucose, Bld 106 (*)    Albumin 3.4 (*)    All other components within normal limits  URINALYSIS, ROUTINE W REFLEX MICROSCOPIC - Abnormal; Notable for the following components:   Color, Urine YELLOW (*)  APPearance HAZY (*)    Hgb urine dipstick LARGE (*)    Leukocytes,Ua SMALL (*)    Bacteria, UA MANY (*)    All other components within normal limits  CBC  CBG MONITORING, ED  TROPONIN I (HIGH SENSITIVITY)     EKG EKG and rhythm strip are interpreted by myself at 02/13/24 12:10  EKG: [Normal sinus rhythm] at heart rate of 56, normal QRS duration, QTc 430, normal ST segments and T waves no ectopy EKG not consistent with Acute STEMI Rhythm strip: NSR in lead II   RADIOLOGY CT head: No acute abnormalities on my independent interpretation and radiologist agrees    PROCEDURES:  Critical Care performed: No  Procedures   MEDICATIONS ORDERED IN ED: Medications  LORazepam  (ATIVAN ) tablet 1 mg (1 mg Oral Given 02/13/24 1400)  ibuprofen  (ADVIL ) tablet 600 mg (600 mg Oral Given 02/13/24 1400)  acetaminophen  (TYLENOL ) tablet 650 mg (650 mg Oral Given 02/13/24 1400)     IMPRESSION / MDM / ASSESSMENT AND PLAN / ED COURSE                                Summary:67 yo female who presents to our ED for an episode of transient  unresponsiveness and fall on anticoagulation; has had several workups for similar episodes recently   Differential Diagnosis including by not limited to: Syncope, seizure, atypical migraines, arrythmia, ICH, electrolyte derangment, UTI  ED course: patient is neurological intact without deficits on arrival. EKG without acute ischemia. She has no profound electrolyte derangments, anemia, and UA not c/w UTI. Troponin not elevated. CT head demonstrated no acute abnormality. I explained to patient and daughter who arrived at bedside that I did not have a cause for the patients symptoms today and after reviewing documentation from recent admissions and ed visits at OSH, I don't think that further ED testing is indicated. They are in agreement and will f/u with neurologist (rescheduled for August). Patient counseled not to drive, operate machinery or be in positions of peril until cleared by pcp and she voiced understanding and agreement  At time of discharge there is no evidence of acute life, limb, vision, or fertility threat. Patient has stable vital signs, pain is well controlled, patient is ambulatory and p.o. tolerant.  Discharge instructions were completed using the [employer] system. I would refer you to those at this time. All warnings prescriptions follow-up etc. were discussed in detail with the patient. Patient indicates understanding and is agreeable with this plan. All questions answered.  Patient is made aware that they may return to the emergency department for any worsening or new condition or for any other emergency.  --  Risk: 5 This patient has a high risk of morbidity due to further diagnostic testing or treatment. Rationale: This patient's evaluation and management involve a high risk of morbidity due to the potential severity of presenting symptoms, need for diagnostic testing, and/or initiation of treatment that may require close monitoring. The differential includes conditions with  potential for significant deterioration or requiring escalation of care. Treatment decisions in the ED, including medication administration, procedural interventions, or disposition planning, reflect this level of risk. Additional Support: -- Drug therapy requiring intensive monitoring for toxicity [ ]  -- Decision regarding elective major surgery with idenitified patient or procedure risk factors [ ]  -- Decision regarding hospitalization or escalation of hospital-level care [ ]  -- Decision not to resuscitate or to de-escalate care because  of poor prognosis [ ]  -- Parental controlled substances [ ]   COPA: 5 The patient has a severe exacerbation, progression, or side effect of treatment of the following illness/illnesses: []  OR  The patient has the following acute or chronic illness/injury that poses a possible threat to life or bodily function: [X] : The patient has a potentially serious acute condition or an acute exacerbation of a chronic illness requiring urgent evaluation and management in the Emergency Department. The clinical presentation necessitates immediate consideration of life-threatening or function-threatening diagnoses, even if they are ultimately ruled out.  Data(2/3 categories following were performed): 5 I reviewed or ordered at least three unique tests, external notes, and/or the history required an independent historian as one of the three requirements as following: cbc, cmp, UA, troponin, patients daughter, discharge summery from OSH in [place] AND  I independently interpreted the following test: CT head without contast OR  I discussed the management of the patient with the following external physician or qualified healthcare provider: []     Suggested E/M Coding Level: 5, 99285, This has been selected based on the 03/03/22 CPT guidelines for E/M codes in the Emergency Department based on 2/3 of the CoPA, Data, and Risk.  FINAL CLINICAL IMPRESSION(S) / ED DIAGNOSES   Final  diagnoses:  Recurrent episodes of unresponsiveness     Rx / DC Orders   ED Discharge Orders     None        Note:  This document was prepared using Dragon voice recognition software and may include unintentional dictation errors.   Nicholaus Rolland BRAVO, MD 02/13/24 1758

## 2024-02-13 NOTE — ED Triage Notes (Signed)
 Patient to ED via POV for syncope. States recent hospital admission and was dc'd on Saturday. C/o headache and right shoulder pain. Denies hitting head but does take blood thinners.

## 2024-02-13 NOTE — Discharge Instructions (Signed)
 You were seen in the emergency department after an episode of unresponsiveness.  It is unclear to me what is causing your symptoms but your workup today was reassuring.  I would not drive or operate any machinery until you are cleared by your primary care physician and neurologist.  Please discuss with your primary care physician regarding the need for another outpatient sleep study.  Please return with any acutely worsening symptoms or any other emergency.  It was very nice meeting you and I wish you the best of luck with everything -- RETURN PRECAUTIONS & AFTERCARE: (ENGLISH) RETURN PRECAUTIONS: Return immediately to the emergency department or see/call your doctor if you feel worse, weak or have changes in speech or vision, are short of breath, have fever, vomiting, pain, bleeding or dark stool, trouble urinating or any new issues. Return here or see/call your doctor if not improving as expected for your suspected condition. FOLLOW-UP CARE: Call your doctor and/or any doctors we referred you to for more advice and to make an appointment. Do this today, tomorrow or after the weekend. Some doctors only take PPO insurance so if you have HMO insurance you may want to contact your HMO or your regular doctor for referral to a specialist within your plan. Either way tell the doctor's office that it was a referral from the emergency department so you get the soonest possible appointment.  YOUR TEST RESULTS: Take result reports of any blood or urine tests, imaging tests and EKG's to your doctor and any referral doctor. Have any abnormal tests repeated. Your doctor or a referral doctor can let you know when this should be done. Also make sure your doctor contacts this hospital to get any test results that are not currently available such as cultures or special tests for infection and final imaging reports, which are often not available at the time you leave the ER but which may list additional important findings that  are not documented on the preliminary report. BLOOD PRESSURE: If your blood pressure was greater than 120/80 have your blood pressure rechecked within 1 to 2 weeks. MEDICATION SIDE EFFECTS: Do not drive, walk, bike, take the bus, etc. if you have received or are being prescribed any sedating medications such as those for pain or anxiety or certain antihistamines like Benadryl . If you have been give one of these here get a taxi home or have a friend drive you home. Ask your pharmacist to counsel you on potential side effects of any new medication

## 2024-02-13 NOTE — ED Notes (Signed)
 Pt reports having severe migraine pain that travels down to her right shoulder that started this morning. Pt has a Hx of the same in taking the medication as a preventative. Pt also reports having spells where she falls out and is normally seated when this happens. Pt denies any falls. Pt reports that during one of her spells over the weekend she was out for 10 minutes. Pt reports not remembering anything during these spells and feels out of it. Pt states they took 2 Advil  gel caps this morning with no relief. Pt also states she has been dealing with vertigo for an unspecified amount of time and has undergone PT for the same with no relief.     Pt up to the bathroom with minimal assistance from staff and reported some dizziness. Pt had a steady even gait and returned to bed. Pt tolerated activity well.
